# Patient Record
Sex: Female | Born: 1960 | Race: Black or African American | Hispanic: No | Marital: Single | Smoking: Never smoker
Health system: Southern US, Community
[De-identification: ages and names within clinical notes are randomized; demographics above are authoritative.]

## PROBLEM LIST (undated history)

## (undated) DIAGNOSIS — E042 Nontoxic multinodular goiter: Secondary | ICD-10-CM

## (undated) DIAGNOSIS — I509 Heart failure, unspecified: Secondary | ICD-10-CM

## (undated) DIAGNOSIS — F329 Major depressive disorder, single episode, unspecified: Secondary | ICD-10-CM

## (undated) DIAGNOSIS — R7303 Prediabetes: Secondary | ICD-10-CM

## (undated) DIAGNOSIS — M21619 Bunion of unspecified foot: Secondary | ICD-10-CM

## (undated) DIAGNOSIS — N644 Mastodynia: Secondary | ICD-10-CM

## (undated) DIAGNOSIS — E559 Vitamin D deficiency, unspecified: Secondary | ICD-10-CM

## (undated) DIAGNOSIS — K5900 Constipation, unspecified: Secondary | ICD-10-CM

## (undated) DIAGNOSIS — IMO0001 Reserved for inherently not codable concepts without codable children: Secondary | ICD-10-CM

## (undated) DIAGNOSIS — N951 Menopausal and female climacteric states: Secondary | ICD-10-CM

## (undated) DIAGNOSIS — R0602 Shortness of breath: Secondary | ICD-10-CM

## (undated) DIAGNOSIS — M2550 Pain in unspecified joint: Secondary | ICD-10-CM

## (undated) DIAGNOSIS — L210 Seborrhea capitis: Secondary | ICD-10-CM

## (undated) DIAGNOSIS — E669 Obesity, unspecified: Secondary | ICD-10-CM

## (undated) DIAGNOSIS — R5383 Other fatigue: Secondary | ICD-10-CM

## (undated) DIAGNOSIS — B009 Herpesviral infection, unspecified: Secondary | ICD-10-CM

## (undated) DIAGNOSIS — G4730 Sleep apnea, unspecified: Secondary | ICD-10-CM

## (undated) DIAGNOSIS — R071 Chest pain on breathing: Secondary | ICD-10-CM

## (undated) DIAGNOSIS — G9332 Myalgic encephalomyelitis/chronic fatigue syndrome: Secondary | ICD-10-CM

## (undated) DIAGNOSIS — M797 Fibromyalgia: Secondary | ICD-10-CM

## (undated) DIAGNOSIS — M069 Rheumatoid arthritis, unspecified: Secondary | ICD-10-CM

## (undated) DIAGNOSIS — R5381 Other malaise: Secondary | ICD-10-CM

## (undated) DIAGNOSIS — Z5189 Encounter for other specified aftercare: Secondary | ICD-10-CM

## (undated) DIAGNOSIS — F32A Depression, unspecified: Secondary | ICD-10-CM

## (undated) DIAGNOSIS — N189 Chronic kidney disease, unspecified: Secondary | ICD-10-CM

## (undated) DIAGNOSIS — F419 Anxiety disorder, unspecified: Secondary | ICD-10-CM

## (undated) DIAGNOSIS — E7800 Pure hypercholesterolemia, unspecified: Secondary | ICD-10-CM

## (undated) DIAGNOSIS — F411 Generalized anxiety disorder: Secondary | ICD-10-CM

## (undated) DIAGNOSIS — L708 Other acne: Secondary | ICD-10-CM

## (undated) DIAGNOSIS — L709 Acne, unspecified: Secondary | ICD-10-CM

## (undated) DIAGNOSIS — R002 Palpitations: Secondary | ICD-10-CM

## (undated) DIAGNOSIS — M7121 Synovial cyst of popliteal space [Baker], right knee: Secondary | ICD-10-CM

## (undated) DIAGNOSIS — I499 Cardiac arrhythmia, unspecified: Secondary | ICD-10-CM

## (undated) DIAGNOSIS — M542 Cervicalgia: Secondary | ICD-10-CM

## (undated) DIAGNOSIS — M1990 Unspecified osteoarthritis, unspecified site: Secondary | ICD-10-CM

## (undated) HISTORY — DX: Acne, unspecified: L70.9

## (undated) HISTORY — DX: Cardiac arrhythmia, unspecified: I49.9

## (undated) HISTORY — DX: Nontoxic multinodular goiter: E04.2

## (undated) HISTORY — DX: Rheumatoid arthritis, unspecified: M06.9

## (undated) HISTORY — DX: Major depressive disorder, single episode, unspecified: F32.9

## (undated) HISTORY — DX: Pure hypercholesterolemia, unspecified: E78.00

## (undated) HISTORY — DX: Pain in unspecified joint: M25.50

## (undated) HISTORY — DX: Shortness of breath: R06.02

## (undated) HISTORY — DX: Cervicalgia: M54.2

## (undated) HISTORY — DX: Menopausal and female climacteric states: N95.1

## (undated) HISTORY — PX: TONSILLECTOMY: SUR1361

## (undated) HISTORY — DX: Chest pain on breathing: R07.1

## (undated) HISTORY — DX: Vitamin D deficiency, unspecified: E55.9

## (undated) HISTORY — DX: Chronic kidney disease, unspecified: N18.9

## (undated) HISTORY — DX: Unspecified osteoarthritis, unspecified site: M19.90

## (undated) HISTORY — DX: Palpitations: R00.2

## (undated) HISTORY — DX: Other malaise: R53.81

## (undated) HISTORY — DX: Other malaise: R53.83

## (undated) HISTORY — DX: Reserved for inherently not codable concepts without codable children: IMO0001

## (undated) HISTORY — DX: Encounter for other specified aftercare: Z51.89

## (undated) HISTORY — DX: Obesity, unspecified: E66.9

## (undated) HISTORY — DX: Synovial cyst of popliteal space (Baker), right knee: M71.21

## (undated) HISTORY — DX: Sleep apnea, unspecified: G47.30

## (undated) HISTORY — DX: Seborrhea capitis: L21.0

## (undated) HISTORY — DX: Constipation, unspecified: K59.00

## (undated) HISTORY — DX: Mastodynia: N64.4

## (undated) HISTORY — DX: Bunion of unspecified foot: M21.619

## (undated) HISTORY — DX: Herpesviral infection, unspecified: B00.9

## (undated) HISTORY — DX: Heart failure, unspecified: I50.9

## (undated) HISTORY — DX: Other acne: L70.8

## (undated) HISTORY — DX: Myalgic encephalomyelitis/chronic fatigue syndrome: G93.32

## (undated) HISTORY — PX: ABDOMINAL HYSTERECTOMY: SHX81

## (undated) HISTORY — PX: REFRACTIVE SURGERY: SHX103

## (undated) HISTORY — DX: Prediabetes: R73.03

## (undated) HISTORY — DX: Fibromyalgia: M79.7

## (undated) HISTORY — PX: CARDIAC CATHETERIZATION: SHX172

## (undated) HISTORY — DX: Generalized anxiety disorder: F41.1

## (undated) HISTORY — DX: Depression, unspecified: F32.A

## (undated) HISTORY — PX: LAPAROSCOPIC HYSTERECTOMY: SHX1926

## (undated) HISTORY — DX: Anxiety disorder, unspecified: F41.9

---

## 2001-12-04 ENCOUNTER — Other Ambulatory Visit: Admission: RE | Admit: 2001-12-04 | Discharge: 2001-12-04 | Payer: Self-pay | Admitting: Internal Medicine

## 2001-12-07 ENCOUNTER — Encounter: Payer: Self-pay | Admitting: Internal Medicine

## 2001-12-07 ENCOUNTER — Encounter: Admission: RE | Admit: 2001-12-07 | Discharge: 2001-12-07 | Payer: Self-pay | Admitting: Internal Medicine

## 2002-08-31 ENCOUNTER — Emergency Department (HOSPITAL_COMMUNITY): Admission: EM | Admit: 2002-08-31 | Discharge: 2002-08-31 | Payer: Self-pay | Admitting: Emergency Medicine

## 2002-09-02 ENCOUNTER — Encounter: Payer: Self-pay | Admitting: Emergency Medicine

## 2002-09-02 ENCOUNTER — Ambulatory Visit (HOSPITAL_COMMUNITY): Admission: RE | Admit: 2002-09-02 | Discharge: 2002-09-02 | Payer: Self-pay | Admitting: Emergency Medicine

## 2006-01-02 ENCOUNTER — Emergency Department (HOSPITAL_COMMUNITY): Admission: EM | Admit: 2006-01-02 | Discharge: 2006-01-02 | Payer: Self-pay | Admitting: Emergency Medicine

## 2007-07-13 ENCOUNTER — Emergency Department: Payer: Self-pay | Admitting: Internal Medicine

## 2007-07-13 ENCOUNTER — Other Ambulatory Visit: Payer: Self-pay

## 2007-10-30 ENCOUNTER — Ambulatory Visit: Payer: Self-pay

## 2008-06-14 ENCOUNTER — Other Ambulatory Visit: Payer: Self-pay

## 2008-06-15 ENCOUNTER — Inpatient Hospital Stay: Payer: Self-pay | Admitting: Internal Medicine

## 2008-08-12 ENCOUNTER — Ambulatory Visit: Payer: Self-pay | Admitting: Family Medicine

## 2008-08-13 ENCOUNTER — Ambulatory Visit: Payer: Self-pay | Admitting: Family Medicine

## 2008-11-18 ENCOUNTER — Ambulatory Visit: Payer: Self-pay | Admitting: Internal Medicine

## 2008-11-18 ENCOUNTER — Ambulatory Visit: Payer: Self-pay | Admitting: Obstetrics & Gynecology

## 2008-11-25 ENCOUNTER — Inpatient Hospital Stay: Payer: Self-pay | Admitting: Obstetrics & Gynecology

## 2008-12-05 ENCOUNTER — Ambulatory Visit: Payer: Self-pay | Admitting: Obstetrics & Gynecology

## 2008-12-07 ENCOUNTER — Inpatient Hospital Stay: Payer: Self-pay | Admitting: Obstetrics & Gynecology

## 2008-12-19 ENCOUNTER — Ambulatory Visit: Payer: Self-pay | Admitting: Internal Medicine

## 2008-12-31 ENCOUNTER — Ambulatory Visit: Payer: Self-pay | Admitting: Urology

## 2009-03-09 ENCOUNTER — Encounter: Payer: Self-pay | Admitting: Vascular Surgery

## 2009-03-19 ENCOUNTER — Encounter: Payer: Self-pay | Admitting: Vascular Surgery

## 2009-06-25 ENCOUNTER — Other Ambulatory Visit: Payer: Self-pay | Admitting: Family Medicine

## 2009-09-18 LAB — HM DEXA SCAN

## 2009-10-05 ENCOUNTER — Ambulatory Visit: Payer: Self-pay | Admitting: Family Medicine

## 2009-11-24 ENCOUNTER — Ambulatory Visit: Payer: Self-pay | Admitting: Family Medicine

## 2009-12-06 ENCOUNTER — Ambulatory Visit: Payer: Self-pay | Admitting: Family Medicine

## 2009-12-31 IMAGING — CT CT HEAD WITHOUT CONTRAST
2 series · 16 of 30 positions shown, 20 images · non-contrast
Comparison: none

REASON FOR EXAM: weak l sided numbness
COMMENTS:

PROCEDURE:     CT  - CT HEAD WITHOUT CONTRAST  - June 14, 2008 [DATE]
RESULT:     No intraaxial or extraaxial pathologic fluid or blood
collections are identified.  No mass lesion is noted. There is no
hydrocephalus.  No bony abnormality is identified.

[Series 2: without · axial · non-contrast · 0.44mm/px · z∈[-174,-39]mm · 13 of 33 slices shown, 17 images]
[im 3/33  brain]
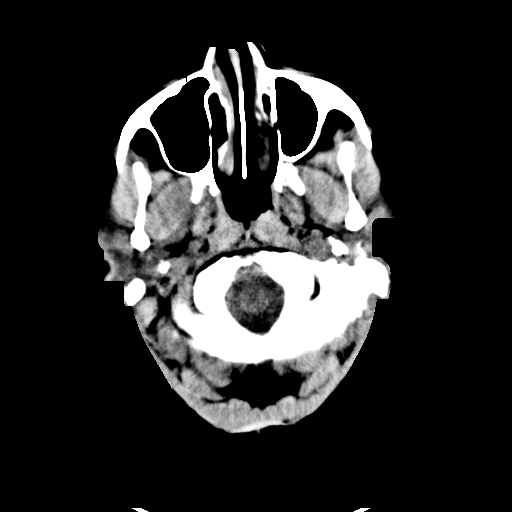
[im 3/33  bone]
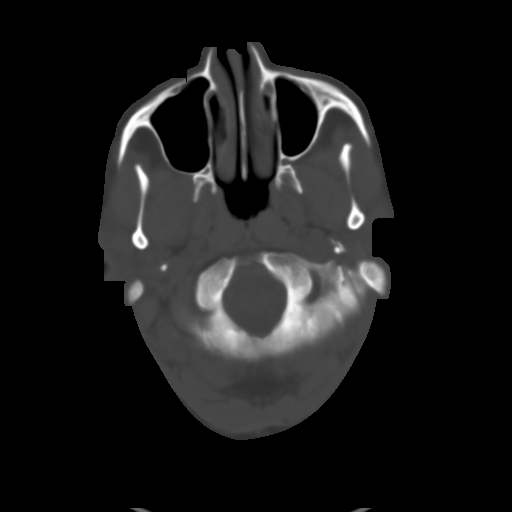
[im 5/33  brain]
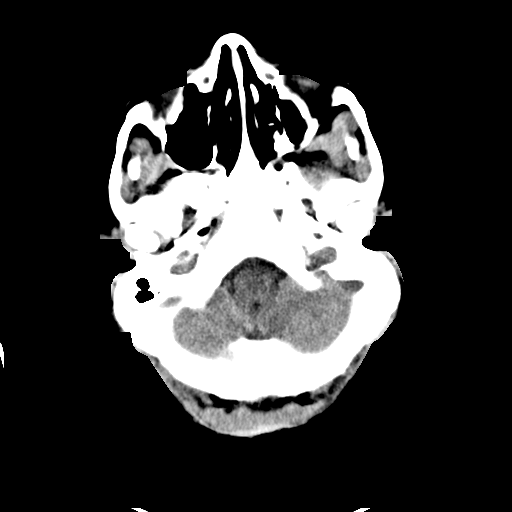
[im 7/33  brain]
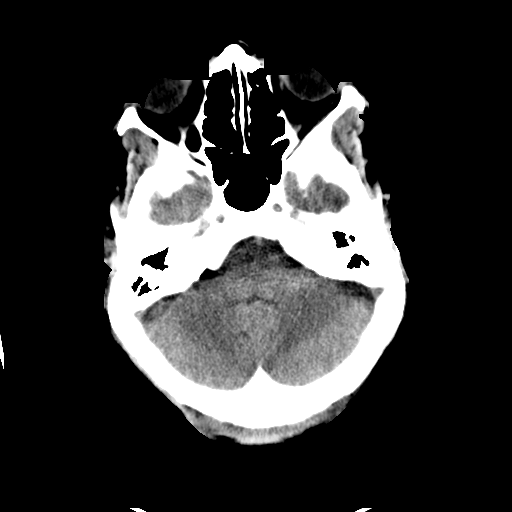
[im 10/33  brain]
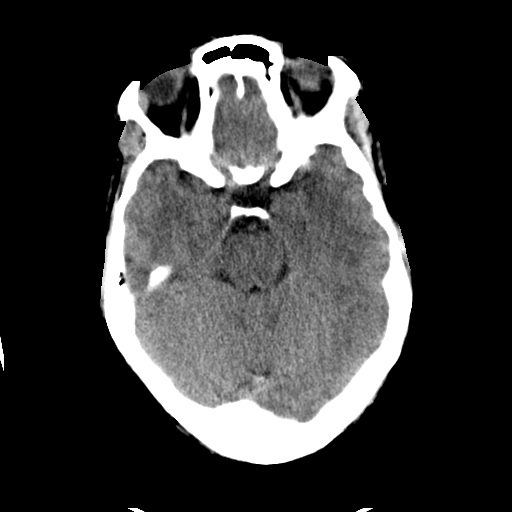
[im 12/33  brain]
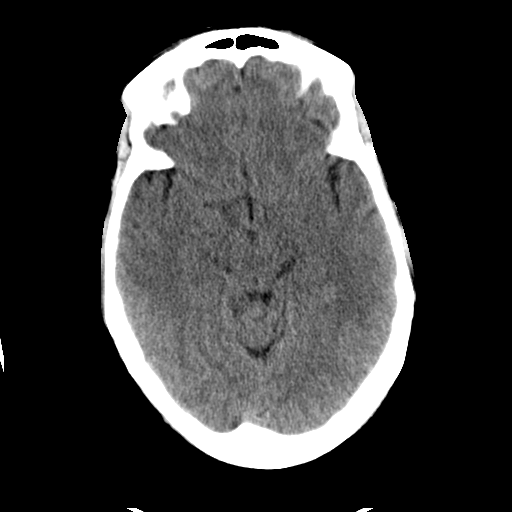
[im 12/33  bone]
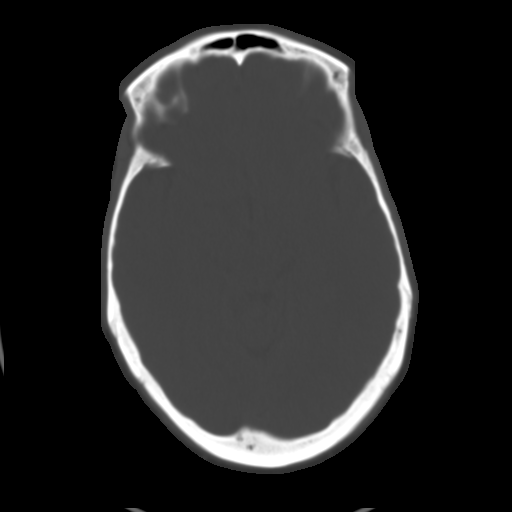
[im 14/33  brain]
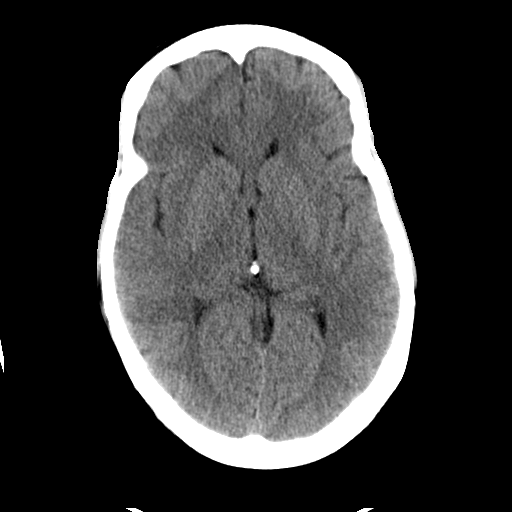
[im 17/33  brain]
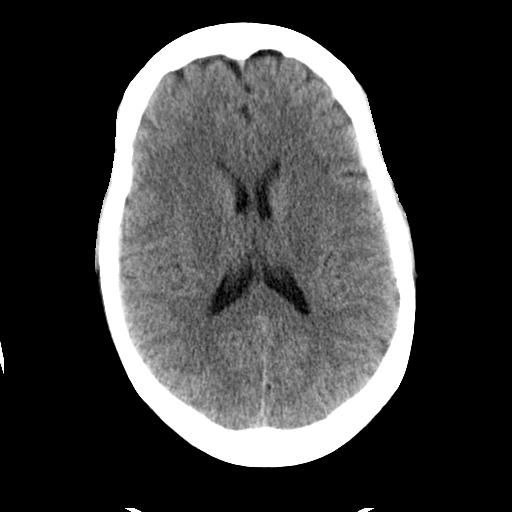
[im 19/33  brain]
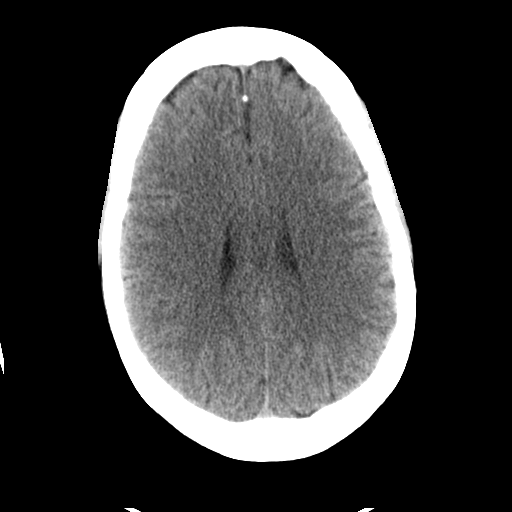
[im 21/33  brain]
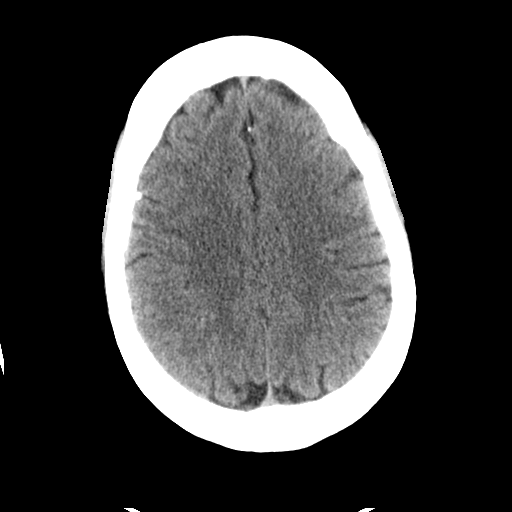
[im 21/33  bone]
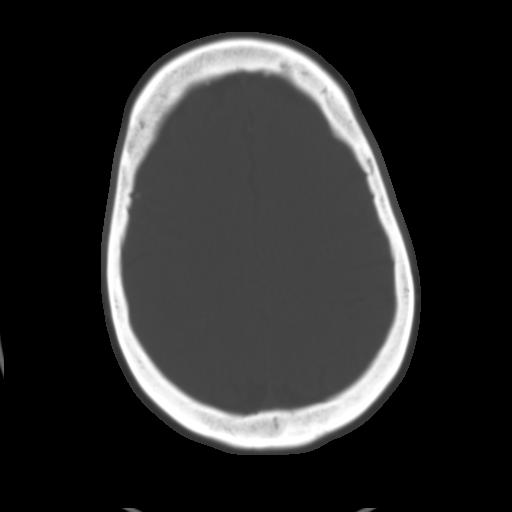
[im 23/33  brain]
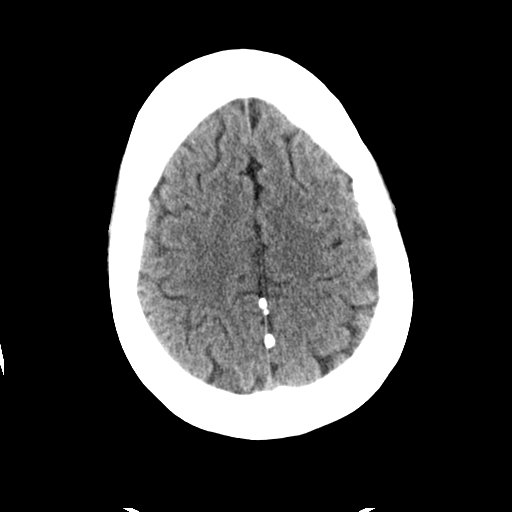
[im 26/33  brain]
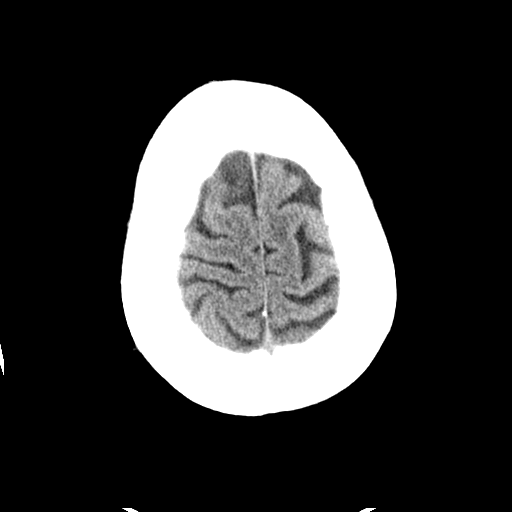
[im 28/33  brain]
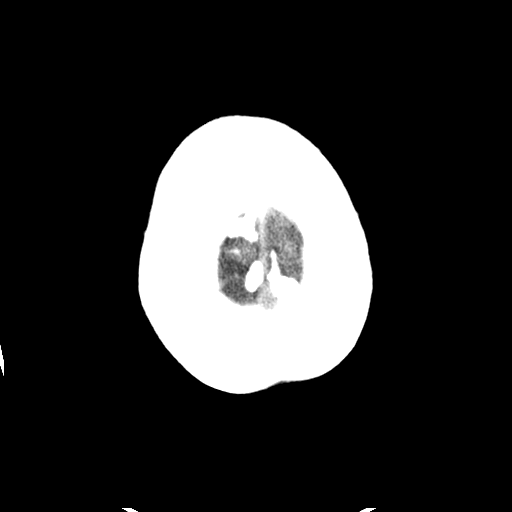
[im 30/33  brain]
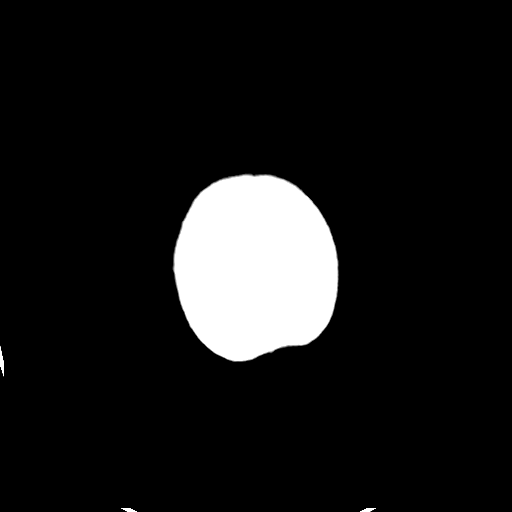
[im 30/33  bone]
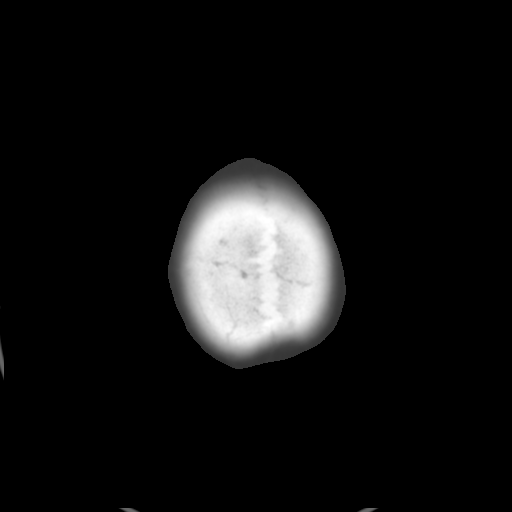

[Series 3: bone · axial · 0.44mm/px · z∈[-174,-129]mm · 3 of 33 slices shown]
[im 3/33  bone]
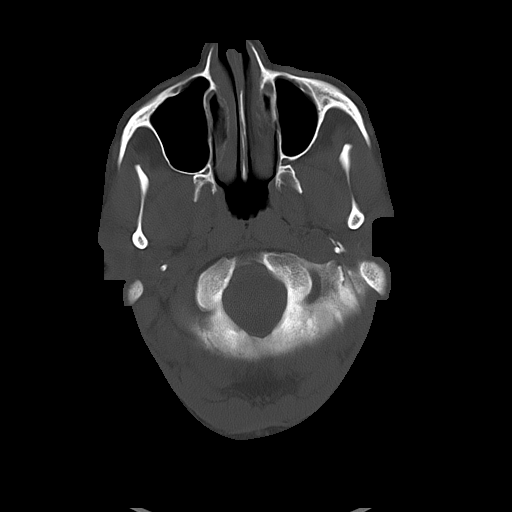
[im 7/33  bone]
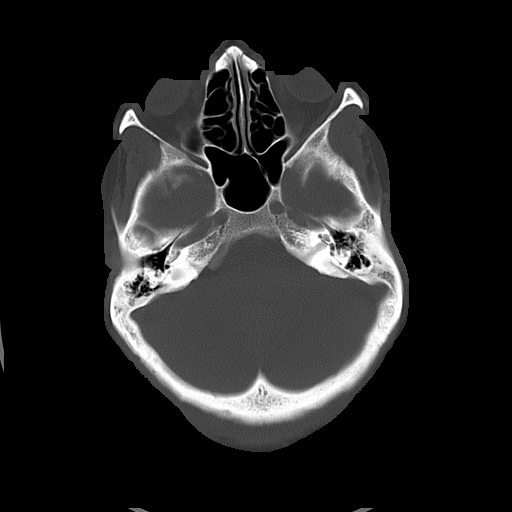
[im 12/33  bone]
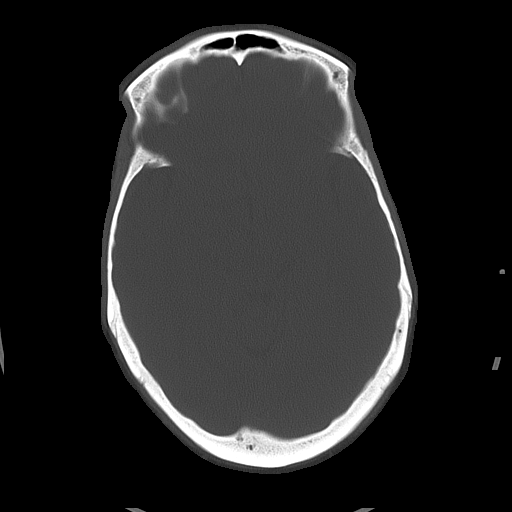

[16 of 30 positions shown; findings below may reference images not displayed]

IMPRESSION: No acute or focal abnormalities.

## 2010-12-01 ENCOUNTER — Ambulatory Visit: Payer: Self-pay | Admitting: Family Medicine

## 2010-12-08 ENCOUNTER — Ambulatory Visit: Payer: Self-pay | Admitting: Family Medicine

## 2010-12-19 HISTORY — PX: CARPAL TUNNEL RELEASE: SHX101

## 2010-12-19 HISTORY — PX: LIPOMA EXCISION: SHX5283

## 2010-12-31 ENCOUNTER — Ambulatory Visit: Payer: Self-pay | Admitting: Specialist

## 2011-01-07 ENCOUNTER — Ambulatory Visit: Payer: Self-pay | Admitting: Specialist

## 2011-01-09 ENCOUNTER — Encounter: Payer: Self-pay | Admitting: Obstetrics and Gynecology

## 2011-12-16 ENCOUNTER — Ambulatory Visit: Payer: Self-pay | Admitting: Family Medicine

## 2012-02-01 ENCOUNTER — Ambulatory Visit: Payer: Self-pay | Admitting: Family Medicine

## 2012-03-05 ENCOUNTER — Ambulatory Visit: Payer: Self-pay | Admitting: Family Medicine

## 2012-03-27 ENCOUNTER — Ambulatory Visit: Payer: Self-pay | Admitting: General Surgery

## 2012-04-17 ENCOUNTER — Ambulatory Visit: Payer: Self-pay | Admitting: General Surgery

## 2012-04-17 LAB — HM COLONOSCOPY: HM Colonoscopy: NORMAL

## 2013-01-15 ENCOUNTER — Encounter: Payer: Self-pay | Admitting: *Deleted

## 2013-01-21 ENCOUNTER — Ambulatory Visit: Payer: Self-pay | Admitting: Cardiovascular Disease

## 2013-01-23 ENCOUNTER — Ambulatory Visit: Payer: Self-pay | Admitting: Family Medicine

## 2013-01-23 LAB — HM MAMMOGRAPHY: HM Mammogram: NORMAL

## 2013-01-25 ENCOUNTER — Encounter: Payer: Self-pay | Admitting: Cardiovascular Disease

## 2013-01-25 ENCOUNTER — Ambulatory Visit (INDEPENDENT_AMBULATORY_CARE_PROVIDER_SITE_OTHER): Payer: BC Managed Care – PPO | Admitting: Cardiovascular Disease

## 2013-01-25 VITALS — BP 122/78 | HR 79 | Ht 65.5 in | Wt 183.2 lb

## 2013-01-25 DIAGNOSIS — R002 Palpitations: Secondary | ICD-10-CM

## 2013-01-25 DIAGNOSIS — R079 Chest pain, unspecified: Secondary | ICD-10-CM

## 2013-01-25 NOTE — Patient Instructions (Addendum)
Your physician has requested that you have an exercise tolerance test. For further information please visit [email or web address]. Please also follow instruction sheet, as given.  Follow up as needed.  

## 2013-01-25 NOTE — Assessment & Plan Note (Signed)
Her chest pain is overall atypical and could be musculoskeletal in etiology. Her physical exam is unremarkable and baseline ECG is normal. Her risk factors include postmenopausal state and family history of coronary artery disease (although there is no premature CAD).  I recommend further evaluation with a treadmill stress test. Followup as needed.

## 2013-01-25 NOTE — Progress Notes (Signed)
HPI  This is a pleasant 52 year old [place] American female who was referred by Dr. Carlynn Purl for evaluation of chest pain. The patient has no previous cardiac history. She has no history of diabetes, hypertension or hyperlipidemia. Her father died at the age of 32 from myocardial infarction but he was a smoker. There is no family history of premature coronary artery disease. She is a lifelong nonsmoker. She was told in the past about possible mitral valve prolapse but that was determined not to be the case. She reports intermittent episodes of substernal chest discomfort described as an aching sensation without radiation. It is mostly at rest and is not triggered by physical activities. It's not associated with significant dyspnea. There is no orthopnea, PND or lower extremity edema. It's occasionally triggered by stress and anxiety. She reports having a stress test in the past but none in the last few years.  No Known Allergies   Current Outpatient Prescriptions on File Prior to Visit  Medication Sig Dispense Refill  . Armodafinil (NUVIGIL) 250 MG tablet Take 1/2 tablet every other day.      . fluocinonide (LIDEX) 0.05 % external solution Apply 1 application topically 2 (two) times daily.       Marland Kitchen LORazepam (ATIVAN) 0.5 MG tablet Take 0.5 mg by mouth every 8 (eight) hours as needed.       . traZODone (DESYREL) 50 MG tablet Take 50 mg by mouth at bedtime.          Past Medical History  Diagnosis Date  . Herpes simplex without mention of complication   . Unspecified constipation   . Cervicalgia   . Other acne   . Painful respiration   . Seborrhea capitis   . Unspecified sleep apnea   . Unspecified vitamin D deficiency   . Myalgia and myositis, unspecified   . Bunion   . Symptomatic menopausal or female climacteric states   . Other malaise and fatigue   . Mastodynia   . Arrhythmia   . Palpitations      Past Surgical History  Procedure Date  . Laparoscopic hysterectomy       Family History  Problem Relation Age of Onset  . Heart attack Father      History   Social History  . Marital Status: Married    Spouse Name: N/A    Number of Children: N/A  . Years of Education: N/A   Occupational History  . Not on file.   Social History Main Topics  . Smoking status: Never Smoker   . Smokeless tobacco: Not on file  . Alcohol Use: Yes  . Drug Use: No  . Sexually Active:    Other Topics Concern  . Not on file   Social History Narrative  . No narrative on file     ROS Constitutional: Negative for fever, chills, diaphoresis, activity change, appetite change and fatigue.  HENT: Negative for hearing loss, nosebleeds, congestion, sore throat, facial swelling, drooling, trouble swallowing, neck pain, voice change, sinus pressure and tinnitus.  Eyes: Negative for photophobia, pain, discharge and visual disturbance.  Respiratory: Negative for apnea, cough shortness of breath and wheezing.  Cardiovascular: Negative for chest pain, palpitations and leg swelling.  Gastrointestinal: Negative for nausea, vomiting, abdominal pain, diarrhea, constipation, blood in stool and abdominal distention.  Genitourinary: Negative for dysuria, urgency, frequency, hematuria and decreased urine volume.  Musculoskeletal: Negative for myalgias, back pain, joint swelling, arthralgias and gait problem.  Skin: Negative for color change, pallor,  rash and wound.  Neurological: Negative for dizziness, tremors, seizures, syncope, speech difficulty, weakness, light-headedness, numbness and headaches.  Psychiatric/Behavioral: Negative for suicidal ideas, hallucinations, behavioral problems and agitation. The patient is not nervous/anxious.     PHYSICAL EXAM   BP 122/78  Pulse 79  Ht 5' 5.5" (1.664 m)  Wt 183 lb 4 oz (83.122 kg)  BMI 30.03 kg/m2 Constitutional: She is oriented to person, place, and time. She appears well-developed and well-nourished. No distress.  HENT: No  nasal discharge.  Head: Normocephalic and atraumatic.  Eyes: Pupils are equal and round. Right eye exhibits no discharge. Left eye exhibits no discharge.  Neck: Normal range of motion. Neck supple. No JVD present. No thyromegaly present.  Cardiovascular: Normal rate, regular rhythm, normal heart sounds. Exam reveals no gallop and no friction rub. No murmur heard.  Pulmonary/Chest: Effort normal and breath sounds normal. No stridor. No respiratory distress. She has no wheezes. She has no rales. She exhibits no tenderness.  Abdominal: Soft. Bowel sounds are normal. She exhibits no distension. There is no tenderness. There is no rebound and no guarding.  Musculoskeletal: Normal range of motion. She exhibits no edema and no tenderness.  Neurological: She is alert and oriented to person, place, and time. Coordination normal.  Skin: Skin is warm and dry. No rash noted. She is not diaphoretic. No erythema. No pallor.  Psychiatric: She has a normal mood and affect. Her behavior is normal. Judgment and thought content normal.     EKG: Sinus  Rhythm  WITHIN NORMAL LIMITS   ASSESSMENT AND PLAN

## 2013-01-29 ENCOUNTER — Ambulatory Visit: Payer: Self-pay | Admitting: Family Medicine

## 2013-02-08 ENCOUNTER — Ambulatory Visit (INDEPENDENT_AMBULATORY_CARE_PROVIDER_SITE_OTHER): Payer: BC Managed Care – PPO | Admitting: Cardiovascular Disease

## 2013-02-08 ENCOUNTER — Encounter: Payer: Self-pay | Admitting: Cardiovascular Disease

## 2013-02-08 DIAGNOSIS — R079 Chest pain, unspecified: Secondary | ICD-10-CM

## 2013-02-08 NOTE — Patient Instructions (Addendum)
Your stress test is normal.  Follow up as needed.  

## 2013-02-08 NOTE — Procedures (Signed)
    Treadmill Stress test  Indication: Atypical chest pain.  Baseline Data:  Resting EKG shows NSR with rate of 74 bpm, no significant ST or T wave changes. Resting blood pressure of 110/80 mm Hg Stand bruce protocal was used.  Exercise Data:  Patient exercised for  8 min 0 sec,  Peak heart rate of 153 bpm.  This was 91 % of the maximum predicted heart rate. No symptoms of chest pain or lightheadedness were reported at peak stress or in recovery.  Peak Blood pressure recorded was 152/82 Maximal work level: 10.1 METs.  Heart rate at 3 minutes in recovery was 100 bpm. BP response: Normal HR response: Normal  EKG with Exercise: Sinus tachycardia with no significant ST changes.  FINAL IMPRESSION: Normal exercise stress test. No significant EKG changes concerning for ischemia. Good exercise tolerance.

## 2013-06-04 ENCOUNTER — Ambulatory Visit (INDEPENDENT_AMBULATORY_CARE_PROVIDER_SITE_OTHER): Payer: BC Managed Care – PPO | Admitting: General Surgery

## 2013-07-02 ENCOUNTER — Ambulatory Visit (INDEPENDENT_AMBULATORY_CARE_PROVIDER_SITE_OTHER): Payer: BC Managed Care – PPO | Admitting: General Surgery

## 2013-07-02 ENCOUNTER — Encounter (INDEPENDENT_AMBULATORY_CARE_PROVIDER_SITE_OTHER): Payer: Self-pay | Admitting: General Surgery

## 2013-07-02 VITALS — BP 118/72 | HR 88 | Resp 14 | Ht 65.5 in | Wt 183.6 lb

## 2013-07-02 DIAGNOSIS — N644 Mastodynia: Secondary | ICD-10-CM

## 2013-07-04 NOTE — Progress Notes (Signed)
Patient ID: Teresa Gallagher, female   DOB: 06-14-1961, 52 y.o.   MRN: [id number]  Chief Complaint  Patient presents with  . New Evaluation    eval enlarged axill lymphnodes    HPI Teresa Gallagher is a 52 y.o. female.  Referred by Dr Maurice March HPI 5 yof who about 2 years ago developed pain and tenderness in the outer edge of right breast/right chest wall.  Apparently mm/us has detected some enlarged nodes on that right side.  The latest [place] I have shows that bilateral axilla demonstrated enlarged nodes up to 1.6 cm that are normal appearing.  She has more studies that are not available during this visit.  This tenderness has gotten worse over the last year.  Apparently a biopsy of the nodes has been recommended at some time.  The pain wakes her up at night.  She is not exercising due to pain.  She has no discharge and has no mass present.  She has taken some ibuprofen which has helped.   Past Medical History  Diagnosis Date  . Herpes simplex without mention of complication   . Unspecified constipation   . Cervicalgia   . Other acne   . Painful respiration   . Seborrhea capitis   . Unspecified sleep apnea   . Unspecified vitamin D deficiency   . Myalgia and myositis, unspecified   . Bunion   . Symptomatic menopausal or female climacteric states   . Other malaise and fatigue   . Mastodynia   . Arrhythmia   . Palpitations   . Blood transfusion without reported diagnosis     Past Surgical History  Procedure Laterality Date  . Laparoscopic hysterectomy      followed by 3 cuff repairs  . Cesarean section      Family History  Problem Relation Age of Onset  . Heart attack Father     Social History History  Substance Use Topics  . Smoking status: Never Smoker   . Smokeless tobacco: Not on file  . Alcohol Use: Not on file    No Known Allergies  Current Outpatient Prescriptions  Medication Sig Dispense Refill  . Armodafinil (NUVIGIL) 250 MG tablet Take 1/2 tablet every other  day.      . Ascorbic Acid (VITAMIN C) 1000 MG tablet Take 1,000 mg by mouth daily.      Marland Kitchen aspirin 81 MG tablet Take 81 mg by mouth daily.      . valACYclovir (VALTREX) 500 MG tablet Take 500 mg by mouth daily as needed.       . Vitamin D, Ergocalciferol, (DRISDOL) 50000 UNITS CAPS Take 50,000 Units by mouth every 7 (seven) days.       . fluocinonide (LIDEX) 0.05 % external solution Apply 1 application topically 2 (two) times daily.       Marland Kitchen LORazepam (ATIVAN) 0.5 MG tablet Take 0.5 mg by mouth every 8 (eight) hours as needed.       . traZODone (DESYREL) 50 MG tablet Take 50 mg by mouth at bedtime.        No current facility-administered medications for this visit.    Review of Systems Review of Systems  Constitutional: Negative for fever, chills and unexpected weight change.  HENT: Negative for hearing loss, congestion, sore throat, trouble swallowing and voice change.   Eyes: Negative for visual disturbance.  Respiratory: Negative for cough and wheezing.   Cardiovascular: Positive for palpitations. Negative for chest pain and leg swelling.  Gastrointestinal: Negative  for nausea, vomiting, abdominal pain, diarrhea, constipation, blood in stool, abdominal distention and anal bleeding.  Genitourinary: Negative for hematuria, vaginal bleeding and difficulty urinating.  Musculoskeletal: Negative for arthralgias.  Skin: Negative for rash and wound.  Neurological: Negative for seizures, syncope and headaches.  Hematological: Negative for adenopathy. Does not bruise/bleed easily.  Psychiatric/Behavioral: Negative for confusion.    Blood pressure 118/72, pulse 88, resp. rate 14, height 5' 5.5" (1.664 m), weight 183 lb 9.6 oz (83.28 kg).  Physical Exam Physical Exam  Constitutional: She appears well-developed and well-nourished.  Cardiovascular: Normal rate, regular rhythm and normal heart sounds.   Pulmonary/Chest: Effort normal and breath sounds normal. She has no wheezes. She has no rales.     Abdominal: Soft.  Lymphadenopathy:    She has no cervical adenopathy.    She has axillary adenopathy (mildly enlarged axillary nodes bilaterally).       Right: No supraclavicular adenopathy present.       Left: No supraclavicular adenopathy present.    Data Reviewed Report of mm/us from [facility]  Assessment    Right breast or chest wall pain Bilateral axillary lad     Plan    I don't know that there is any abnormality in the breast by exam or by imaging.  I don't have all her imaging available as she states she had an mr.  I will have to get her mr and she said she will bring that.  I will have to wait to see that before coming up with a final plan.  Will have to review nodes also to see if a biopsy is indicated.  I will plan on seeing her back after all information is available.  She has gotten better with antiinflammatories and will try that for a week also.  This may be chest wall pain.        Indiana Pechacek 07/04/2013, 6:19 PM

## 2013-07-19 ENCOUNTER — Telehealth (INDEPENDENT_AMBULATORY_CARE_PROVIDER_SITE_OTHER): Payer: Self-pay

## 2013-07-19 NOTE — Telephone Encounter (Signed)
error 

## 2013-07-30 ENCOUNTER — Telehealth (INDEPENDENT_AMBULATORY_CARE_PROVIDER_SITE_OTHER): Payer: Self-pay

## 2013-07-30 NOTE — Telephone Encounter (Signed)
Pt returned my call. I advised pt that Dr Dwain Sarna did review the CT scans and he does not see anything of concern with the breast or enlarged lymph nodes. Dr Dwain Sarna does not offer any surgery at this time b/c can't find anything of concern. I offered for the pt to come back in to the office for an appt if any questions with Dr Dwain Sarna and she does want to see him again. I made her an appt with Dr Dwain Sarna for 9/3.

## 2013-07-30 NOTE — Telephone Encounter (Signed)
LMOM for pt to call me so I can discuss Dr Dwain Sarna viewing her CD.

## 2013-08-21 ENCOUNTER — Encounter (INDEPENDENT_AMBULATORY_CARE_PROVIDER_SITE_OTHER): Payer: BC Managed Care – PPO | Admitting: General Surgery

## 2013-09-16 ENCOUNTER — Encounter (INDEPENDENT_AMBULATORY_CARE_PROVIDER_SITE_OTHER): Payer: BC Managed Care – PPO | Admitting: General Surgery

## 2013-09-17 ENCOUNTER — Encounter (INDEPENDENT_AMBULATORY_CARE_PROVIDER_SITE_OTHER): Payer: Self-pay

## 2013-09-21 IMAGING — US US SOFT TISSUE EXCLUDE HEAD/NECK
1 series · 17 of 25 positions shown · non-contrast
Comparison: none

REASON FOR EXAM: attn RT Chest Wall  anterior axillary line  chest wall
pain  soft tissues sw...
COMMENTS:

[Series 1: us soft tissue exclude head/neck · 17 of 25 slices shown]
[im 1/25]
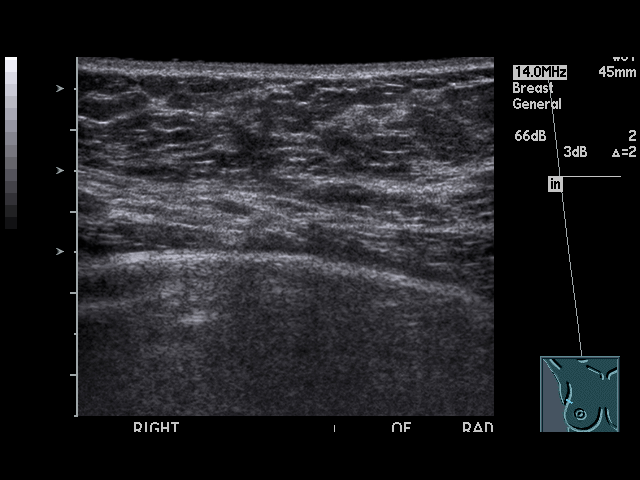
[im 3/25]
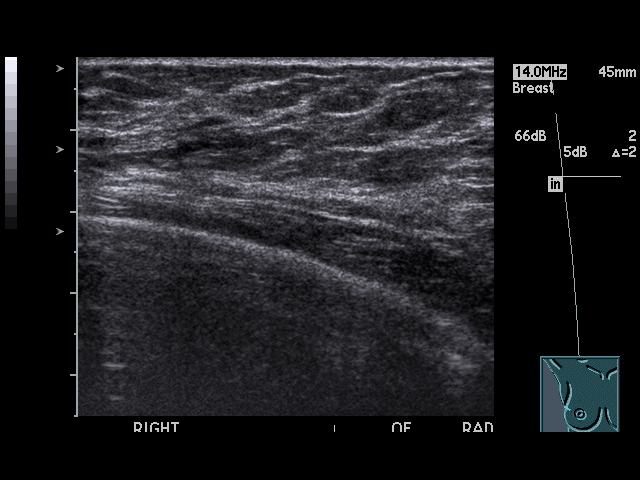
[im 4/25]
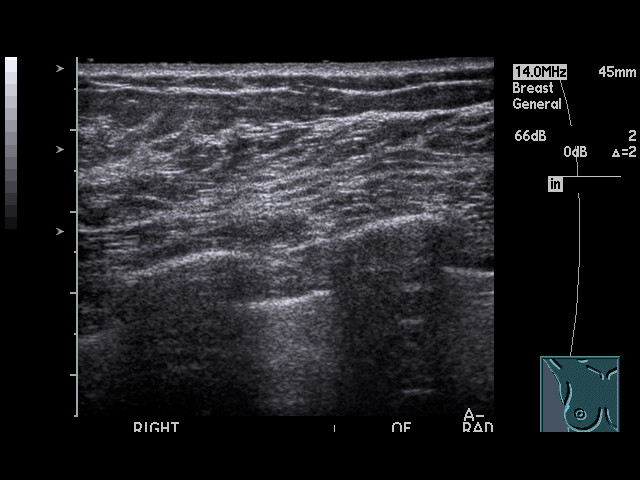
[im 6/25]
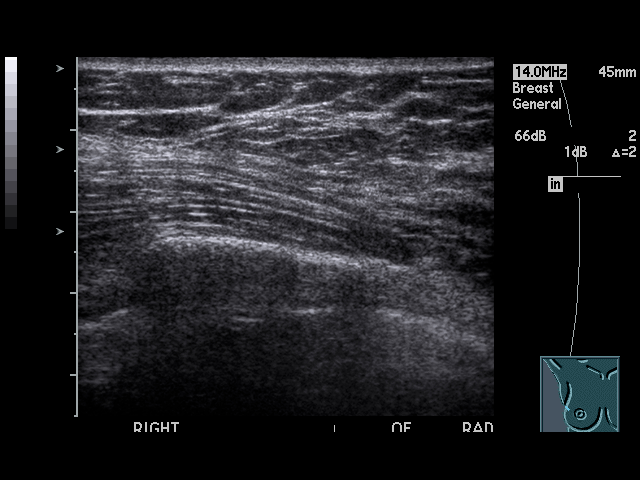
[im 7/25]
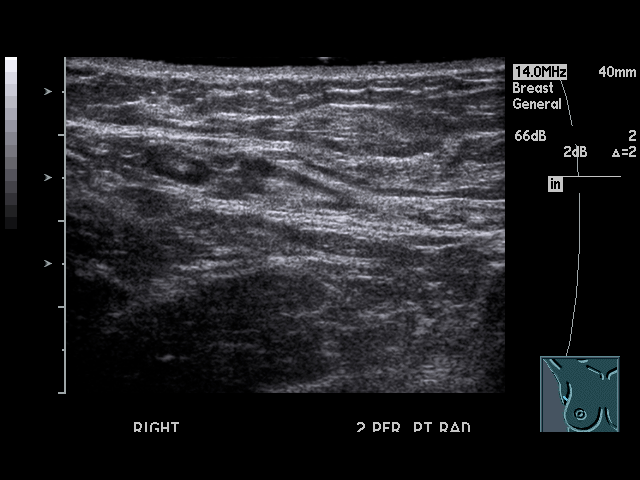
[im 9/25]
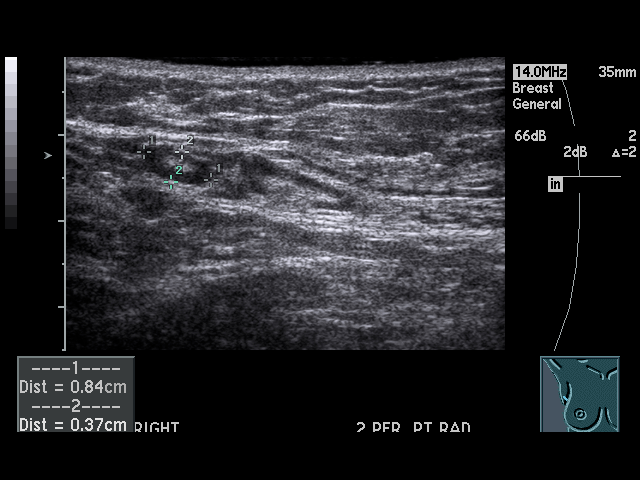
[im 10/25]
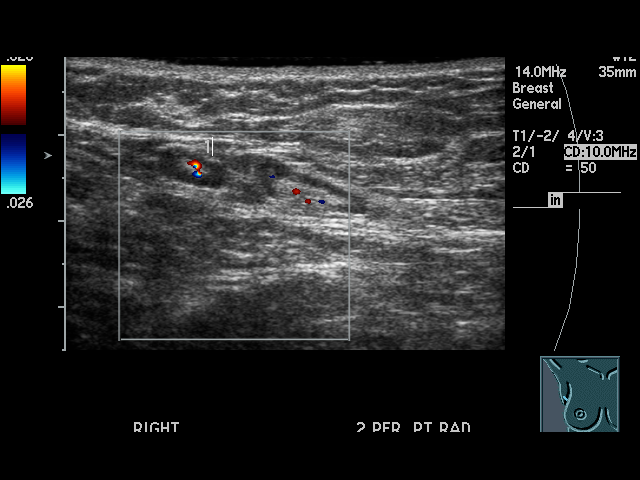
[im 12/25]
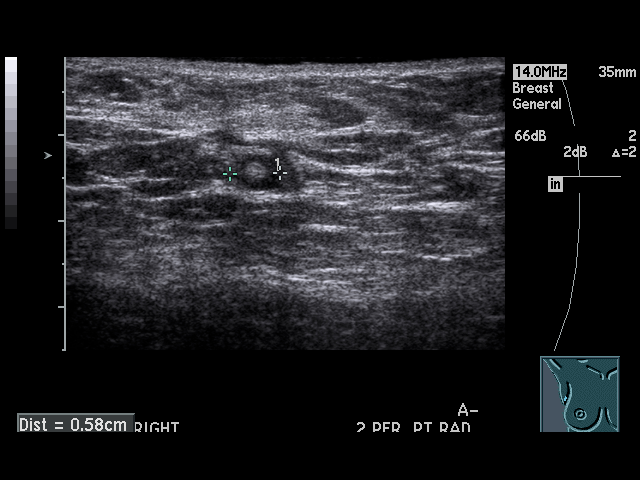
[im 13/25]
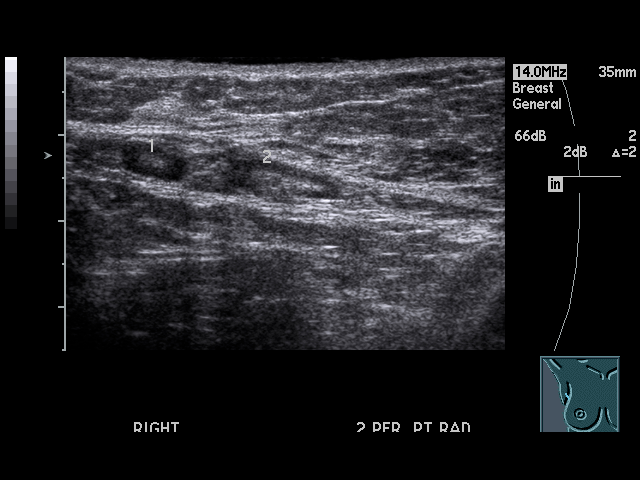
[im 14/25]
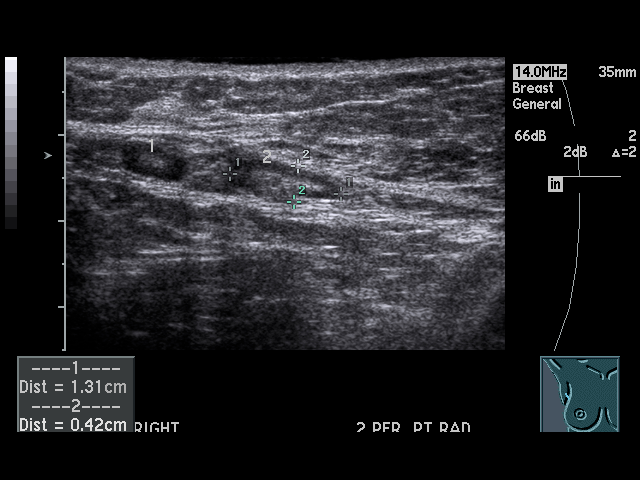
[im 16/25]
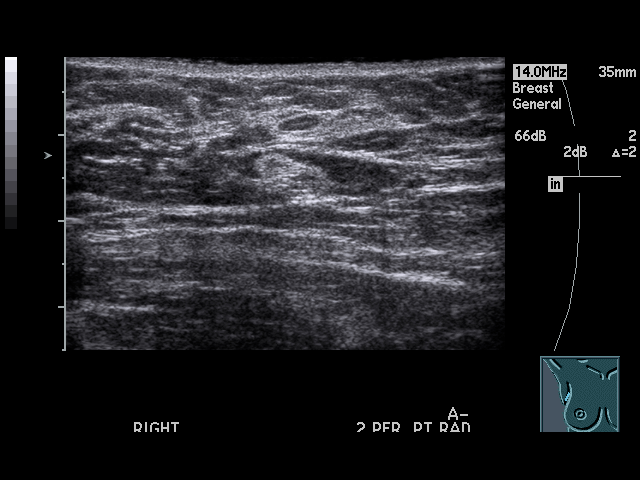
[im 17/25]
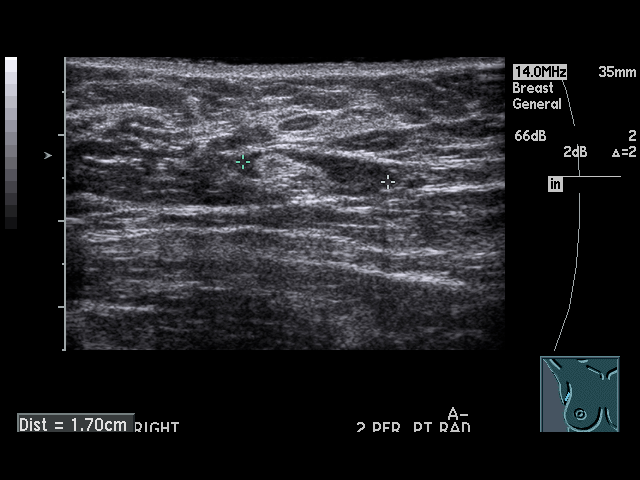
[im 19/25]
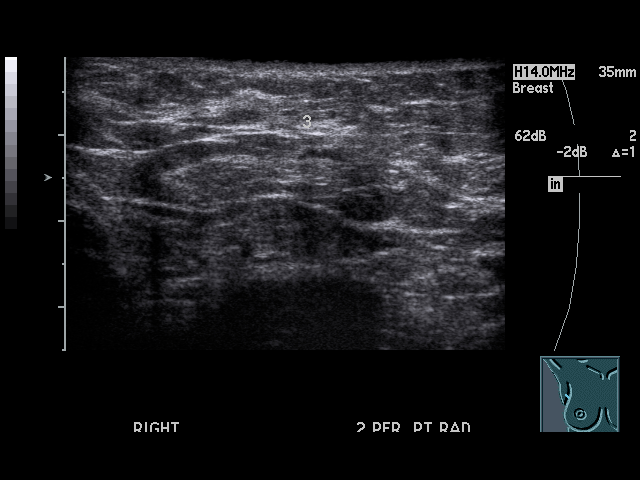
[im 20/25]
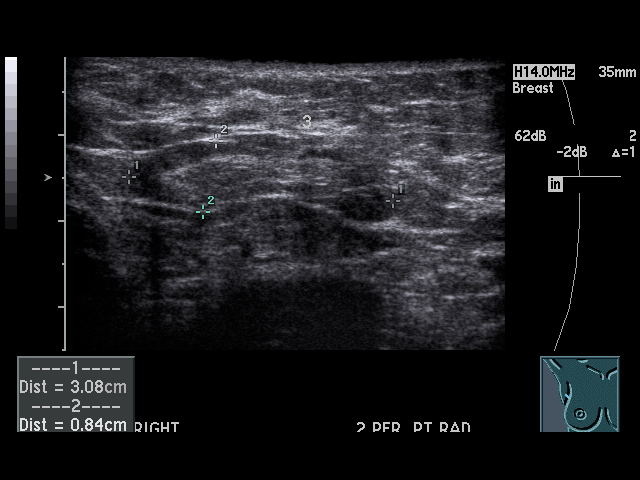
[im 22/25]
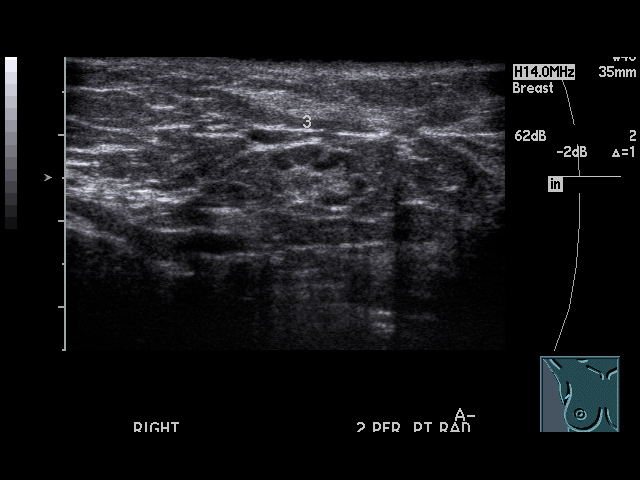
[im 23/25]
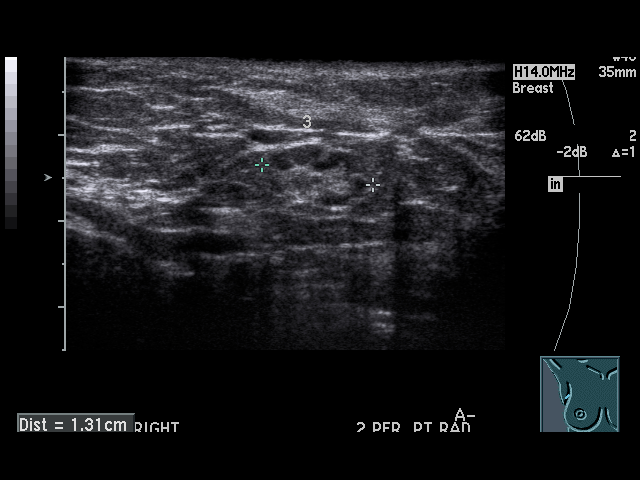
[im 25/25]
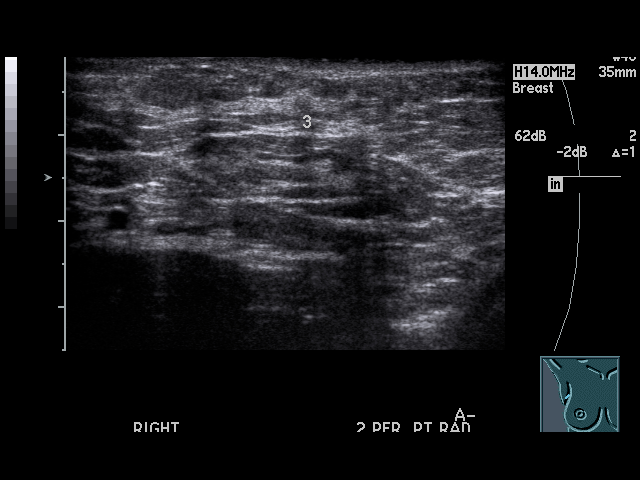

[17 of 25 positions shown; findings below may reference images not displayed]

PROCEDURE:     US  - US SOFT TISSUE, NOT NECK /  HEAD  - March 05, 2012  [DATE]

RESULT:      Two areas of the right axilla were evaluated in the region of
clinical concern. The initial area along the lower axilla, lateral portion
of the right breast in an area of skin thickening demonstrates no solid or
cystic sonographic abnormalities. In a more superior lateral region, three
lymph nodes are appreciated. The largest measures 3.08 x 1.31 x 1.84 cm. The
smaller lymph nodes measure 0.84 x 0.37 cm and 1.31 x 0.42 cm. These
findings have sonographic appearances consistent with lymph nodes
demonstrating a fatty hilum with a hypoechoic peripheral rim.

No further solid or cystic sonographic abnormality is appreciated.
IMPRESSION: Prominent lymph nodes are identified within the right axilla as described
above in the second region of clinical concern. The initial area of clinical
concern in area of skin thickening demonstrates no sonographic abnormalities.

## 2013-10-07 ENCOUNTER — Encounter (INDEPENDENT_AMBULATORY_CARE_PROVIDER_SITE_OTHER): Payer: Self-pay | Admitting: General Surgery

## 2013-10-07 ENCOUNTER — Ambulatory Visit (INDEPENDENT_AMBULATORY_CARE_PROVIDER_SITE_OTHER): Payer: BC Managed Care – PPO | Admitting: General Surgery

## 2013-10-07 VITALS — BP 112/80 | HR 80 | Temp 97.8°F | Resp 14 | Ht 65.0 in | Wt 183.4 lb

## 2013-10-07 DIAGNOSIS — N644 Mastodynia: Secondary | ICD-10-CM

## 2013-10-12 NOTE — Progress Notes (Signed)
Subjective:     Patient ID: Teresa Gallagher, female   DOB: 1961/07/02, 52 y.o.   MRN: [id number]  HPI 83 yof who about 2 years ago developed pain and tenderness in the outer edge of right breast/right chest wall. Apparently mm/us has detected some enlarged nodes on that right side. The latest [place] I have shows that bilateral axilla demonstrated enlarged nodes up to 1.6 cm that are normal appearing. This tenderness has gotten worse over the last year. Apparently a biopsy of the nodes has been recommended at some time. The pain wakes her up at night. She is not exercising due to pain. She has no discharge and has no mass present. She has taken some ibuprofen which has helped. Since our last visit she has sent all her imaging that has been done outside. She is unchanged today.   She also got her ct scan which I have reviewed and have the report.  This was done 2/14.  It shows no mediastinal, hilar or axillary adenopathy.     Review of Systems     Objective:   Physical Exam This is unchanged from our last visit.  Abdominal: Soft.  Lymphadenopathy:  She has no cervical adenopathy.  I do not feel any axillary adenopathy today Right: No supraclavicular adenopathy present.  Left: No supraclavicular adenopathy present.      Assessment:     Breast pain ? History lad      Plan:     I cant identify on exam or imaging any area to biopsy.  I cannot explain what appears to be mastalgia on that right side and we discussed some conservative therapy for that.  I think she should get schedule mm and I can see back as needed

## 2013-12-04 ENCOUNTER — Encounter (INDEPENDENT_AMBULATORY_CARE_PROVIDER_SITE_OTHER): Payer: Self-pay | Admitting: General Surgery

## 2014-01-15 ENCOUNTER — Other Ambulatory Visit: Payer: Self-pay | Admitting: Obstetrics and Gynecology

## 2014-01-15 DIAGNOSIS — M79629 Pain in unspecified upper arm: Secondary | ICD-10-CM

## 2014-01-15 DIAGNOSIS — N644 Mastodynia: Secondary | ICD-10-CM

## 2014-01-23 ENCOUNTER — Other Ambulatory Visit: Payer: BC Managed Care – PPO

## 2014-07-09 ENCOUNTER — Encounter (HOSPITAL_COMMUNITY): Payer: Self-pay | Admitting: Emergency Medicine

## 2014-07-09 ENCOUNTER — Emergency Department (HOSPITAL_COMMUNITY): Payer: BC Managed Care – PPO

## 2014-07-09 ENCOUNTER — Emergency Department (HOSPITAL_COMMUNITY)
Admission: EM | Admit: 2014-07-09 | Discharge: 2014-07-09 | Disposition: A | Discharge status: Home / Self Care | Payer: BC Managed Care – PPO | Attending: Emergency Medicine | Admitting: Emergency Medicine

## 2014-07-09 DIAGNOSIS — Z79899 Other long term (current) drug therapy: Secondary | ICD-10-CM | POA: Insufficient documentation

## 2014-07-09 DIAGNOSIS — Z8639 Personal history of other endocrine, nutritional and metabolic disease: Secondary | ICD-10-CM | POA: Insufficient documentation

## 2014-07-09 DIAGNOSIS — Z791 Long term (current) use of non-steroidal anti-inflammatories (NSAID): Secondary | ICD-10-CM | POA: Insufficient documentation

## 2014-07-09 DIAGNOSIS — R0789 Other chest pain: Secondary | ICD-10-CM

## 2014-07-09 DIAGNOSIS — Z8742 Personal history of other diseases of the female genital tract: Secondary | ICD-10-CM | POA: Insufficient documentation

## 2014-07-09 DIAGNOSIS — R05 Cough: Secondary | ICD-10-CM | POA: Insufficient documentation

## 2014-07-09 DIAGNOSIS — R059 Cough, unspecified: Secondary | ICD-10-CM | POA: Insufficient documentation

## 2014-07-09 DIAGNOSIS — R071 Chest pain on breathing: Secondary | ICD-10-CM | POA: Insufficient documentation

## 2014-07-09 DIAGNOSIS — Z8619 Personal history of other infectious and parasitic diseases: Secondary | ICD-10-CM | POA: Insufficient documentation

## 2014-07-09 DIAGNOSIS — Z8679 Personal history of other diseases of the circulatory system: Secondary | ICD-10-CM | POA: Insufficient documentation

## 2014-07-09 DIAGNOSIS — J3489 Other specified disorders of nose and nasal sinuses: Secondary | ICD-10-CM | POA: Insufficient documentation

## 2014-07-09 DIAGNOSIS — Z8739 Personal history of other diseases of the musculoskeletal system and connective tissue: Secondary | ICD-10-CM | POA: Insufficient documentation

## 2014-07-09 DIAGNOSIS — Z872 Personal history of diseases of the skin and subcutaneous tissue: Secondary | ICD-10-CM | POA: Insufficient documentation

## 2014-07-09 DIAGNOSIS — Z8719 Personal history of other diseases of the digestive system: Secondary | ICD-10-CM | POA: Insufficient documentation

## 2014-07-09 DIAGNOSIS — Z7982 Long term (current) use of aspirin: Secondary | ICD-10-CM | POA: Insufficient documentation

## 2014-07-09 DIAGNOSIS — Z862 Personal history of diseases of the blood and blood-forming organs and certain disorders involving the immune mechanism: Secondary | ICD-10-CM | POA: Insufficient documentation

## 2014-07-09 LAB — COMPREHENSIVE METABOLIC PANEL
ALK PHOS: 65 U/L (ref 39–117)
ALT: 12 U/L (ref 0–35)
AST: 14 U/L (ref 0–37)
Albumin: 3.6 g/dL (ref 3.5–5.2)
Anion gap: 10 (ref 5–15)
BUN: 15 mg/dL (ref 6–23)
CO2: 28 mEq/L (ref 19–32)
Calcium: 9 mg/dL (ref 8.4–10.5)
Chloride: 103 mEq/L (ref 96–112)
Creatinine, Ser: 0.86 mg/dL (ref 0.50–1.10)
GFR calc non Af Amer: 76 mL/min — ABNORMAL LOW (ref >90)
GFR, EST AFRICAN AMERICAN: 88 mL/min — AB (ref >90)
GLUCOSE: 101 mg/dL — AB (ref 70–99)
POTASSIUM: 4.6 meq/L (ref 3.7–5.3)
Sodium: 141 mEq/L (ref 137–147)
TOTAL PROTEIN: 6.8 g/dL (ref 6.0–8.3)
Total Bilirubin: 0.5 mg/dL (ref 0.3–1.2)

## 2014-07-09 LAB — CBC
HCT: 38.7 % (ref 36.0–46.0)
Hemoglobin: 13.6 g/dL (ref 12.0–15.0)
MCH: 29.6 pg (ref 26.0–34.0)
MCHC: 35.1 g/dL (ref 30.0–36.0)
MCV: 84.1 fL (ref 78.0–100.0)
PLATELETS: 173 10*3/uL (ref 150–400)
RBC: 4.6 MIL/uL (ref 3.87–5.11)
RDW: 13.2 % (ref 11.5–15.5)
WBC: 6.1 10*3/uL (ref 4.0–10.5)

## 2014-07-09 LAB — I-STAT TROPONIN, ED: Troponin i, poc: 0 ng/mL (ref 0.00–0.08)

## 2014-07-09 LAB — D-DIMER, QUANTITATIVE: D-Dimer, Quant: <0.27 ug/mL-FEU (ref 0.00–0.48)

## 2014-07-09 MED ORDER — NAPROXEN 500 MG PO TABS: 500.0000 mg | ORAL_TABLET | Freq: Two times a day (BID) | ORAL | Status: DC

## 2014-07-09 MED ORDER — HYDROCODONE-ACETAMINOPHEN 5-325 MG PO TABS
2.0000 | ORAL_TABLET | Freq: Once | ORAL | Status: AC
  Administered 2014-07-09: 2 via ORAL
  Filled 2014-07-09: qty 2

## 2014-07-09 MED ORDER — ASPIRIN 81 MG PO CHEW
324.0000 mg | CHEWABLE_TABLET | Freq: Once | ORAL | Status: AC
  Administered 2014-07-09: 324 mg via ORAL
  Filled 2014-07-09: qty 4

## 2014-07-09 NOTE — Discharge Instructions (Signed)
Chest Wall Pain °Chest wall pain is pain felt in or around the chest bones and muscles. It may take up to 6 weeks to get better. It may take longer if you are active. Chest wall pain can happen on its own. Other times, things like germs, injury, coughing, or exercise can cause the pain. °HOME CARE  °· Avoid activities that make you tired or cause pain. Try not to use your chest, belly (abdominal), or side muscles. Do not use heavy weights. °· Put ice on the sore area. °¨ Put ice in a plastic bag. °¨ Place a towel between your skin and the bag. °¨ Leave the ice on for 15-20 minutes for the first 2 days. °· Only take medicine as told by your doctor. °GET HELP RIGHT AWAY IF:  °· You have more pain or are very uncomfortable. °· You have a fever. °· Your chest pain gets worse. °· You have new problems. °· You feel sick to your stomach (nauseous) or throw up (vomit). °· You start to sweat or feel lightheaded. °· You have a cough with mucus (phlegm). °· You cough up blood. °MAKE SURE YOU:  °· Understand these instructions. °· Will watch your condition. °· Will get help right away if you are not doing well or get worse. °Document Released: 05/23/2008 Document Revised: 02/27/2012 Document Reviewed: 08/01/2011 °[employer]® Patient Information ©2015 [employer]. This information is not intended to replace advice given to you by your health care provider. Make sure you discuss any questions you have with your health care provider. ° °

## 2014-07-09 NOTE — ED Provider Notes (Signed)
Date: 07/09/2014  Rate: 64  Rhythm: normal sinus rhythm  QRS Axis: normal  Intervals: normal  ST/T Wave abnormalities: normal  Conduction Disutrbances: none  Narrative Interpretation: unremarkable     Teresa Silvernail K Lorell Thibodaux-Rasch, MD 07/09/14 [phone]

## 2014-07-09 NOTE — ED Notes (Signed)
Per EMS: pt states that she was having CP that started this AM. Pt states left sided with no radiation. Pt states increases with movement.

## 2014-07-09 NOTE — ED Notes (Signed)
PA at bedside.

## 2014-07-09 NOTE — ED Provider Notes (Signed)
CSN: [id number]     Arrival date & time 07/09/14  0447 History   First MD Initiated Contact with Patient 07/09/14 [phone]     Chief Complaint  Patient presents with  . Chest Pain     (Consider location/radiation/quality/duration/timing/severity/associated sxs/prior Treatment) HPI Pt is a 53yo female with hx of painful respiration, vitamin D deficiency, myalgia and myositis, mastodynia, palpitations and blood transfusion w/o reported diagnosis presenting to ED with c/o gradually worsening, waxing and waning left sided chest pain that started around 6pm yesterday evening while pt was at home.  Pt states pain pain 3/10 at that time, aching and sore, but around 3:40AM this morning pt was awakened from her sleep with 9/10 left sided chest pain that was sharp in nature.  Denies diaphoresis, n/v, or SOB.  Denies radiation of pain. Pain is under left breast and worse with movement. Denies recent injury but does state she has had intermittent cough and congestion for about 2 weeks.  Denies sick contacts or recent travel. Denies hx of CAD. Denies FH of CAD.  Denies taking pain medication PTA.     Past Medical History  Diagnosis Date  . Herpes simplex without mention of complication   . Unspecified constipation   . Cervicalgia   . Other acne   . Painful respiration   . Seborrhea capitis   . Unspecified sleep apnea   . Unspecified vitamin D deficiency   . Myalgia and myositis, unspecified   . Bunion   . Symptomatic menopausal or female climacteric states   . Other malaise and fatigue   . Mastodynia   . Arrhythmia   . Palpitations   . Blood transfusion without reported diagnosis    Past Surgical History  Procedure Laterality Date  . Laparoscopic hysterectomy      followed by 3 cuff repairs  . Cesarean section     Family History  Problem Relation Age of Onset  . Heart attack Father    History  Substance Use Topics  . Smoking status: Never Smoker   . Smokeless tobacco: Not on file  .  Alcohol Use: Not on file   OB History   Grav Para Term Preterm Abortions TAB SAB Ect Mult Living                 Review of Systems  Constitutional: Negative for fever, chills, diaphoresis and fatigue.  HENT: Positive for congestion.   Respiratory: Positive for cough. Negative for shortness of breath.   Cardiovascular: Positive for chest pain. Negative for palpitations and leg swelling.  Gastrointestinal: Negative for nausea, vomiting, abdominal pain and diarrhea.  All other systems reviewed and are negative.     Allergies  Review of patient's allergies indicates no known allergies.  Home Medications   Prior to Admission medications   Medication Sig Start Date End Date Taking? Authorizing Provider  Armodafinil (NUVIGIL) 250 MG tablet Take 125 mg by mouth daily.    Yes Historical Provider, MD  aspirin 81 MG tablet Take 81 mg by mouth daily.   Yes Historical Provider, MD  ibuprofen (ADVIL,MOTRIN) 200 MG tablet Take 200 mg by mouth daily.   Yes Historical Provider, MD  naproxen (NAPROSYN) 500 MG tablet Take 1 tablet (500 mg total) by mouth 2 (two) times daily. 07/09/14   Noland Fordyce, PA-C   BP 119/75  Pulse 64  Temp(Src) 98 F (36.7 C) (Oral)  Resp 20  SpO2 99% Physical Exam  Nursing note and vitals reviewed. Constitutional: She appears well-developed  and well-nourished. No distress.  Pt lying in exam bed comfortably watching television. NAD  HENT:  Head: Normocephalic and atraumatic.  Mouth/Throat: Oropharynx is clear and moist.  Eyes: Conjunctivae are normal. No scleral icterus.  Neck: Normal range of motion. Neck supple.  Cardiovascular: Normal rate, regular rhythm and normal heart sounds.   Regular rate and rhythm  Pulmonary/Chest: Effort normal and breath sounds normal. No respiratory distress. She has no wheezes. She has no rales. She exhibits no tenderness.  No respiratory distress, able to speak in full sentences w/o difficulty. Lungs: CTAB.  Left anterior-lateral  chest wall tenderness. No flail chest or crepitus.   Abdominal: Soft. Bowel sounds are normal. She exhibits no distension and no mass. There is no tenderness. There is no rebound and no guarding.  Musculoskeletal: Normal range of motion.  Neurological: She is alert.  Skin: Skin is warm and dry. She is not diaphoretic.    ED Course  Procedures (including critical care time) Labs Review Labs Reviewed  COMPREHENSIVE METABOLIC PANEL - Abnormal; Notable for the following:    Glucose, Bld 101 (*)    GFR calc non Af Amer 76 (*)    GFR calc Af Amer 88 (*)    All other components within normal limits  CBC  D-DIMER, QUANTITATIVE  I-STAT TROPOININ, ED    Imaging Review Dg Chest 2 View  07/09/2014   CLINICAL DATA:  Pain under the left breast.  EXAM: CHEST  2 VIEW  COMPARISON:  None.  FINDINGS: The lungs are well-aerated. Minimal left basilar opacity likely reflects atelectasis. There is no evidence of pleural effusion or pneumothorax.  The heart is normal in size; the mediastinal contour is within normal limits. No acute osseous abnormalities are seen.  IMPRESSION: Minimal left basilar opacity likely reflects atelectasis; lungs otherwise clear. No displaced rib fracture seen.   Electronically Signed   By: Garald Balding M.D.   On: 07/09/2014 05:54     EKG Interpretation   Date/Time:  Wednesday July 09 2014 04:58:06 EDT Ventricular Rate:  64 PR Interval:  169 QRS Duration: 78 QT Interval:  381 QTC Calculation: 393 R Axis:   32 Text Interpretation:  Sinus rhythm Confirmed by [facility]  MD, APRIL  ([id number]) on 07/09/2014 6:22:24 AM      MDM   Final diagnoses:  Left-sided chest wall pain    Low suspicion for ACS and with negative EKG and troponin with >8hrs symptoms rules out ACS.  Due to pt having pleuritic chest pain and being >50yo will get D-dimer to r/o PE.    D-dimer: negative. Will tx pain as chest-wall/musculoskeletal pain.  Pain did improve in ED with asiprin and norco.   Will discharge home with naproxen. Advised to f/u with PCP next week for recheck of symptoms. Return precautions provided. Pt verbalized understanding and agreement with tx plan.     Noland Fordyce, PA-C 07/09/14 [place], PA-C 07/09/14 2111

## 2014-07-10 NOTE — ED Provider Notes (Signed)
Medical screening examination/treatment/procedure(s) were performed by non-physician practitioner and as supervising physician I was immediately available for consultation/collaboration.   EKG Interpretation   Date/Time:  Wednesday July 09 2014 04:58:06 EDT Ventricular Rate:  64 PR Interval:  169 QRS Duration: 78 QT Interval:  381 QTC Calculation: 393 R Axis:   32 Text Interpretation:  Sinus rhythm Confirmed by [facility]  MD, Trella Thurmond  ([id number]) on 07/09/2014 6:22:24 AM       Kaulin Chaves Alfonso Patten, MD 07/10/14 0005

## 2014-10-13 ENCOUNTER — Ambulatory Visit: Payer: Self-pay | Admitting: Podiatry

## 2014-10-23 ENCOUNTER — Ambulatory Visit (INDEPENDENT_AMBULATORY_CARE_PROVIDER_SITE_OTHER): Payer: BC Managed Care – PPO | Admitting: Podiatry

## 2014-10-23 ENCOUNTER — Ambulatory Visit (INDEPENDENT_AMBULATORY_CARE_PROVIDER_SITE_OTHER): Payer: BC Managed Care – PPO

## 2014-10-23 ENCOUNTER — Encounter: Payer: Self-pay | Admitting: Podiatry

## 2014-10-23 VITALS — BP 111/76 | HR 82 | Resp 16 | Ht 65.0 in | Wt 185.0 lb

## 2014-10-23 DIAGNOSIS — M21611 Bunion of right foot: Secondary | ICD-10-CM

## 2014-10-23 DIAGNOSIS — M779 Enthesopathy, unspecified: Secondary | ICD-10-CM

## 2014-10-23 DIAGNOSIS — M2011 Hallux valgus (acquired), right foot: Secondary | ICD-10-CM

## 2014-10-23 NOTE — Patient Instructions (Signed)
Pre-Operative Instructions  Congratulations, you have decided to take an important step to improving your quality of life.  You can be assured that the doctors of [facility] will be with you every step of the way.  1. Plan to be at the surgery center/hospital at least 1 (one) hour prior to your scheduled time unless otherwise directed by the surgical center/hospital staff.  You must have a responsible adult accompany you, remain during the surgery and drive you home.  Make sure you have directions to the surgical center/hospital and know how to get there on time. 2. For hospital based surgery you will need to obtain a history and physical form from your family physician within 1 month prior to the date of surgery- we will give you a form for you primary physician.  3. We make every effort to accommodate the date you request for surgery.  There are however, times where surgery dates or times have to be moved.  We will contact you as soon as possible if a change in schedule is required.   4. No Aspirin/Ibuprofen for one week before surgery.  If you are on aspirin, any non-steroidal anti-inflammatory medications (Mobic, Aleve, Ibuprofen) you should stop taking it 7 days prior to your surgery.  You make take Tylenol  For pain prior to surgery.  5. Medications- If you are taking daily heart and blood pressure medications, seizure, reflux, allergy, asthma, anxiety, pain or diabetes medications, make sure the surgery center/hospital is aware before the day of surgery so they may notify you which medications to take or avoid the day of surgery. 6. No food or drink after midnight the night before surgery unless directed otherwise by surgical center/hospital staff. 7. No alcoholic beverages 24 hours prior to surgery.  No smoking 24 hours prior to or 24 hours after surgery. 8. Wear loose pants or shorts- loose enough to fit over bandages, boots, and casts. 9. No slip on shoes, sneakers are best. 10. Bring  your boot with you to the surgery center/hospital.  Also bring crutches or a walker if your physician has prescribed it for you.  If you do not have this equipment, it will be provided for you after surgery. 11. If you have not been contracted by the surgery center/hospital by the day before your surgery, call to confirm the date and time of your surgery. 12. Leave-time from work may vary depending on the type of surgery you have.  Appropriate arrangements should be made prior to surgery with your employer. 13. Prescriptions will be provided immediately following surgery by your doctor.  Have these filled as soon as possible after surgery and take the medication as directed. 14. Remove nail polish on the operative foot. 15. Wash the night before surgery.  The night before surgery wash the foot and leg well with the antibacterial soap provided and water paying special attention to beneath the toenails and in between the toes.  Rinse thoroughly with water and dry well with a towel.  Perform this wash unless told not to do so by your physician.  Enclosed: 1 Ice pack (please put in freezer the night before surgery)   1 Hibiclens skin cleaner   Pre-op Instructions  If you have any questions regarding the instructions, do not hesitate to call our office.  [place]: [address] [place], [place] [address] [phone]  [place]: [address] , [place] [address] [phone]  [place]: [address]  [place], [place] [address] [phone]  Dr. Richard   Tuchman DPM, Dr. Norman Regal DPM Dr. Richard Sikora DPM, Dr. M. Todd Hyatt DPM, Dr. Kathryn Egerton DPM 

## 2014-10-24 NOTE — Progress Notes (Signed)
She presents today with a chief complaint of continued pain to the right foot. She states him ready for surgery and has gotten to the point where it limits my daily activities. I am unable to perform activities that I would normally do on a regular basis. Shoe gear is painful. She denies any changes in her past medical history medications allergies surgery social history and review of systems. She denies fever chills nausea vomiting muscle aches or pains or any trauma to the joint.  Objective: Vital signs are stable she is alert and oriented 3. Strong palpable pulses bilateral. Mild to moderate hallux valgus disorder bilateral right greater than left. Radiographic evaluation does demonstrates an increase in the first intermetatarsal angle greater than normal value. With hallux abductus angle greater than normal. Mild elevated first metatarsophalangeal joint. She also has pain on end range of motion of the second metatarsophalangeal joint which on radiograph demonstrates an elongated second metatarsophalangeal joint with mild early hammertoe deformity.  Assessment: Hallux abductovalgus disorder right greater than left. 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  Plan: Discussed etiology pathology conservative versus surgical therapies. At this point we signed a consent form for an [facility] bunion repair with screw fixation and a second metatarsal osteotomy with screw fixation I answered all of the questions regarding these procedures to the best of my ability in layman's terms. We discussed the possible postop complications which may include but are not limited to postop pain bleeding swelling infection need for further surgery. Overcorrection under correction, loss of digit loss of limb loss of life. We discussed anesthesia and the day surgery center where this will be performed.she was dispensed a Cam Walker for postoperative ambulation and I will follow-up  with her in 1 week.

## 2014-11-04 ENCOUNTER — Other Ambulatory Visit: Payer: Self-pay | Admitting: Podiatry

## 2014-11-04 MED ORDER — CEPHALEXIN 500 MG PO CAPS: 500.0000 mg | ORAL_CAPSULE | Freq: Three times a day (TID) | ORAL | Status: DC

## 2014-11-04 MED ORDER — OXYCODONE-ACETAMINOPHEN 10-325 MG PO TABS: ORAL_TABLET | ORAL | Status: DC

## 2014-11-04 MED ORDER — PROMETHAZINE HCL 25 MG PO TABS: 25.0000 mg | ORAL_TABLET | Freq: Three times a day (TID) | ORAL | Status: DC | PRN

## 2014-11-07 ENCOUNTER — Encounter: Payer: Self-pay | Admitting: Podiatry

## 2014-11-07 DIAGNOSIS — M2011 Hallux valgus (acquired), right foot: Secondary | ICD-10-CM

## 2014-11-07 DIAGNOSIS — M21541 Acquired clubfoot, right foot: Secondary | ICD-10-CM

## 2014-11-07 HISTORY — PX: BUNIONECTOMY: SHX129

## 2014-11-10 ENCOUNTER — Telehealth: Payer: Self-pay

## 2014-11-10 NOTE — Telephone Encounter (Signed)
Spoke with patient regarding post operative status, she states that she is doing well and managing her pain effectively. Advised her to watch out for s/s of infection and bleeding and to keep foot elevated and remain in boot

## 2014-11-11 ENCOUNTER — Ambulatory Visit (INDEPENDENT_AMBULATORY_CARE_PROVIDER_SITE_OTHER): Payer: BC Managed Care – PPO | Admitting: Podiatry

## 2014-11-11 ENCOUNTER — Encounter: Payer: Self-pay | Admitting: Podiatry

## 2014-11-11 ENCOUNTER — Ambulatory Visit (INDEPENDENT_AMBULATORY_CARE_PROVIDER_SITE_OTHER): Payer: BC Managed Care – PPO

## 2014-11-11 VITALS — BP 116/78 | HR 73 | Resp 16

## 2014-11-11 DIAGNOSIS — M2011 Hallux valgus (acquired), right foot: Secondary | ICD-10-CM

## 2014-11-11 DIAGNOSIS — Z9889 Other specified postprocedural states: Secondary | ICD-10-CM

## 2014-11-11 NOTE — Progress Notes (Signed)
Dr Milinda Pointer performed a right [facility] bunion repair with screw and right 2nd metatarsal osteotomy with screw on 11/07/14

## 2014-11-11 NOTE — Progress Notes (Signed)
She presents today 1 week status post [facility] bunion repair right foot second metatarsal osteotomy of the right foot states it is a little sore but she's been doing quite well.  Objective: She presents today in her Cam Walker dry sterile compressive dressing intact. Once removed demonstrates moderate edema no erythema saline as drainage or odor. Good range of motion passive. Radiographic evaluation confirms [facility] bunion repair with screw and pin fixation and a second metatarsal osteotomy Fragments are in good position.  Assessment: Well-healing surgical foot right.  Plan: [place] foot today with a dry sterile compressive dressing encouraged range of motion exercises she will continue to keep the foot dry and clean and I will follow-up with her in 1 week.

## 2014-11-17 ENCOUNTER — Telehealth: Payer: Self-pay | Admitting: *Deleted

## 2014-11-17 MED ORDER — OXYCODONE-ACETAMINOPHEN 10-325 MG PO TABS: ORAL_TABLET | ORAL | Status: DC

## 2014-11-17 NOTE — Telephone Encounter (Signed)
Ran out of pain medication on Thursday, need some . Ok to refill per hyatt

## 2014-11-19 ENCOUNTER — Ambulatory Visit (INDEPENDENT_AMBULATORY_CARE_PROVIDER_SITE_OTHER): Payer: BC Managed Care – PPO | Admitting: Podiatry

## 2014-11-19 VITALS — BP 120/83 | HR 91 | Temp 98.6°F | Resp 16

## 2014-11-19 DIAGNOSIS — Z9889 Other specified postprocedural states: Secondary | ICD-10-CM

## 2014-11-20 NOTE — Progress Notes (Signed)
She presents today 2 weeks status post [facility] bunion repair right foot. She denies fever chills nausea vomiting muscle aches and pains that she is doing pretty good.  Objective: Vital signs are stable she is alert and oriented 3 right foot is moderately edematous. She has good range of motion of the first metatarsophalangeal joint. Sutures are intact and margins appear to be well coapted.  Assessment: Well healing surgical foot right.  Plan: Put her in a compression anklet in a Darco shoe. I will allow some leeway with this foot. I encouraged range of motion exercises and I will follow-up with her in the near future.

## 2014-12-01 ENCOUNTER — Ambulatory Visit (INDEPENDENT_AMBULATORY_CARE_PROVIDER_SITE_OTHER): Payer: BC Managed Care – PPO | Admitting: Podiatry

## 2014-12-01 ENCOUNTER — Encounter: Payer: Self-pay | Admitting: Podiatry

## 2014-12-01 ENCOUNTER — Ambulatory Visit (INDEPENDENT_AMBULATORY_CARE_PROVIDER_SITE_OTHER): Payer: BC Managed Care – PPO

## 2014-12-01 VITALS — BP 121/82 | HR 86 | Resp 16

## 2014-12-01 DIAGNOSIS — M2011 Hallux valgus (acquired), right foot: Secondary | ICD-10-CM

## 2014-12-01 DIAGNOSIS — M21611 Bunion of right foot: Secondary | ICD-10-CM

## 2014-12-01 DIAGNOSIS — Z9889 Other specified postprocedural states: Secondary | ICD-10-CM

## 2014-12-01 NOTE — Progress Notes (Signed)
She presents today for follow-up status post all cemented. Right foot second metatarsal osteotomy right foot. She states that it is doing wonderful.  Objective: Vital signs are stable she is alert and oriented 3. She has great range of motion of the first metatarsophalangeal joint of the right foot though she does have moderate edema right. Radiographic evaluation demonstrates well-healed surgical foot.  Assessment: Well-healed surgical foot right.  Plan: I will her to get back into appear tennis shoes before she is able to go back to work this should approximately the 1 month. I will follow-up with her

## 2014-12-10 ENCOUNTER — Encounter: Payer: Self-pay | Admitting: Podiatry

## 2014-12-23 ENCOUNTER — Telehealth: Payer: Self-pay | Admitting: *Deleted

## 2014-12-23 NOTE — Telephone Encounter (Signed)
Called pt. Pt states im 5 weeks post op and im having a lot of swelling, discomfort, feels like a string in in between my toes day and night, and im wearing my surgical shoe, and i have an appt to come in on Monday. Told pt to elevate, stay off of it, ice and take whatever she takes for headaches. Pt understood.

## 2014-12-23 NOTE — Telephone Encounter (Signed)
Pt states she is 5 weeks post-op and continues to have pain and swelling, please call.

## 2014-12-29 ENCOUNTER — Ambulatory Visit (INDEPENDENT_AMBULATORY_CARE_PROVIDER_SITE_OTHER): Payer: BC Managed Care – PPO

## 2014-12-29 ENCOUNTER — Encounter: Payer: BC Managed Care – PPO | Admitting: Podiatry

## 2014-12-29 ENCOUNTER — Encounter: Payer: Self-pay | Admitting: Podiatry

## 2014-12-29 ENCOUNTER — Ambulatory Visit (INDEPENDENT_AMBULATORY_CARE_PROVIDER_SITE_OTHER): Payer: BC Managed Care – PPO | Admitting: Podiatry

## 2014-12-29 VITALS — BP 122/79 | HR 76 | Resp 16

## 2014-12-29 DIAGNOSIS — M21611 Bunion of right foot: Secondary | ICD-10-CM

## 2014-12-29 DIAGNOSIS — M2011 Hallux valgus (acquired), right foot: Secondary | ICD-10-CM

## 2014-12-29 NOTE — Progress Notes (Signed)
She presents today for follow-up of an [facility] bone repair and second metatarsal osteotomy right foot. She states that the foot is still tender and swollen. She states that I'm only on it for 15 minutes is so swollen hardly wear her shoe.  Objective: Vital signs are stable she is alert and oriented 3. Minimal edema today no erythema cellulitis drainage or odor. Skin lines are closed and there are no open lesions. She has good range of motion though with some limitation first metatarsophalangeal joint right foot. Radiographic evaluation confirms well-healed osteotomy.  Assessment: Well-healed surgical foot right.  Plan: I'm going to encourage physical therapy and contrast baths until she starts back to work and I will follow-up with her in approximately for 6 weeks.

## 2015-01-26 ENCOUNTER — Ambulatory Visit: Payer: BC Managed Care – PPO | Admitting: Podiatry

## 2015-02-04 ENCOUNTER — Ambulatory Visit: Payer: BC Managed Care – PPO | Admitting: Podiatry

## 2015-02-09 ENCOUNTER — Encounter: Payer: Self-pay | Admitting: Podiatry

## 2015-02-09 ENCOUNTER — Ambulatory Visit (INDEPENDENT_AMBULATORY_CARE_PROVIDER_SITE_OTHER): Payer: BC Managed Care – PPO

## 2015-02-09 ENCOUNTER — Ambulatory Visit (INDEPENDENT_AMBULATORY_CARE_PROVIDER_SITE_OTHER): Payer: BC Managed Care – PPO | Admitting: Podiatry

## 2015-02-09 VITALS — BP 123/76 | HR 86 | Resp 12

## 2015-02-09 DIAGNOSIS — M2011 Hallux valgus (acquired), right foot: Secondary | ICD-10-CM

## 2015-02-09 DIAGNOSIS — Z9889 Other specified postprocedural states: Secondary | ICD-10-CM

## 2015-02-09 NOTE — Progress Notes (Signed)
She presents today for her final postop visit regarding her right foot. She is status post [facility] bunion repair with screw and pain in right and screw second metatarsal osteotomy right foot. She states this still gets sore sometimes.  Objective: Vital signs are stable she is alert and oriented 3 she has great range of motion with minimal edema no erythema cellulitis drainage or odor. Radiograph confirms 100% healed osteotomy.  Assessment: Well-healing surgical foot alignment is perfect.  Plan: I encouraged her to increase her activity and range of motion exercises.

## 2015-05-28 ENCOUNTER — Telehealth: Payer: Self-pay

## 2015-05-28 DIAGNOSIS — G4730 Sleep apnea, unspecified: Principal | ICD-10-CM

## 2015-05-28 DIAGNOSIS — G4710 Hypersomnia, unspecified: Secondary | ICD-10-CM

## 2015-05-28 MED ORDER — NUVIGIL 250 MG PO TABS: 250.0000 mg | ORAL_TABLET | Freq: Every day | ORAL | Status: DC

## 2015-05-28 NOTE — Telephone Encounter (Signed)
Call patient to pick up Nuvigil prescription in office.  Patient also requesting a refill of her Cymbalta but in the lower dose of 30 mg due to the 60 mg was making her feel weird and nausea.

## 2015-05-29 MED ORDER — DULOXETINE HCL 30 MG PO CPEP: 30.0000 mg | ORAL_CAPSULE | Freq: Every day | ORAL | Status: DC

## 2015-05-29 NOTE — Telephone Encounter (Signed)
Sent 30mg 

## 2015-05-29 NOTE — Telephone Encounter (Signed)
She can open the capsules of cymbalta and take half of the beads for about one week and try going up again.

## 2015-05-29 NOTE — Telephone Encounter (Signed)
Patient is requesting the tablets instead of the capsules of Cymbalta and states it would be easier for to just do the 30 mg tablets. Cause she will not remember to take out the beads every day.

## 2015-05-29 NOTE — Telephone Encounter (Signed)
Patient notified when picking up her Nuvigil prescription in office.

## 2015-07-13 ENCOUNTER — Encounter: Payer: Self-pay | Admitting: Family Medicine

## 2015-07-13 DIAGNOSIS — G4710 Hypersomnia, unspecified: Secondary | ICD-10-CM | POA: Insufficient documentation

## 2015-07-13 DIAGNOSIS — K5909 Other constipation: Secondary | ICD-10-CM | POA: Insufficient documentation

## 2015-07-13 DIAGNOSIS — M797 Fibromyalgia: Secondary | ICD-10-CM | POA: Insufficient documentation

## 2015-07-13 DIAGNOSIS — G4700 Insomnia, unspecified: Secondary | ICD-10-CM | POA: Insufficient documentation

## 2015-07-13 DIAGNOSIS — E559 Vitamin D deficiency, unspecified: Secondary | ICD-10-CM | POA: Insufficient documentation

## 2015-07-13 DIAGNOSIS — G4730 Sleep apnea, unspecified: Secondary | ICD-10-CM

## 2015-07-13 DIAGNOSIS — B009 Herpesviral infection, unspecified: Secondary | ICD-10-CM | POA: Insufficient documentation

## 2015-07-13 DIAGNOSIS — F330 Major depressive disorder, recurrent, mild: Secondary | ICD-10-CM | POA: Insufficient documentation

## 2015-07-23 ENCOUNTER — Ambulatory Visit: Payer: Self-pay | Admitting: Family Medicine

## 2015-07-30 ENCOUNTER — Ambulatory Visit (INDEPENDENT_AMBULATORY_CARE_PROVIDER_SITE_OTHER): Payer: BC Managed Care – PPO | Admitting: Family Medicine

## 2015-07-30 ENCOUNTER — Encounter: Payer: Self-pay | Admitting: Family Medicine

## 2015-07-30 ENCOUNTER — Encounter (INDEPENDENT_AMBULATORY_CARE_PROVIDER_SITE_OTHER): Payer: Self-pay

## 2015-07-30 VITALS — BP 112/78 | HR 93 | Temp 98.2°F | Resp 16 | Ht 63.0 in | Wt 196.9 lb

## 2015-07-30 DIAGNOSIS — E669 Obesity, unspecified: Secondary | ICD-10-CM

## 2015-07-30 DIAGNOSIS — F329 Major depressive disorder, single episode, unspecified: Secondary | ICD-10-CM | POA: Diagnosis not present

## 2015-07-30 DIAGNOSIS — G4710 Hypersomnia, unspecified: Secondary | ICD-10-CM | POA: Diagnosis not present

## 2015-07-30 DIAGNOSIS — M797 Fibromyalgia: Secondary | ICD-10-CM

## 2015-07-30 DIAGNOSIS — G4730 Sleep apnea, unspecified: Secondary | ICD-10-CM | POA: Diagnosis not present

## 2015-07-30 DIAGNOSIS — F411 Generalized anxiety disorder: Secondary | ICD-10-CM | POA: Insufficient documentation

## 2015-07-30 DIAGNOSIS — Z1239 Encounter for other screening for malignant neoplasm of breast: Secondary | ICD-10-CM

## 2015-07-30 DIAGNOSIS — G4700 Insomnia, unspecified: Secondary | ICD-10-CM | POA: Diagnosis not present

## 2015-07-30 DIAGNOSIS — Z9289 Personal history of other medical treatment: Secondary | ICD-10-CM | POA: Insufficient documentation

## 2015-07-30 DIAGNOSIS — F32A Depression, unspecified: Secondary | ICD-10-CM

## 2015-07-30 MED ORDER — NUVIGIL 250 MG PO TABS: 250.0000 mg | ORAL_TABLET | Freq: Every day | ORAL | Status: DC

## 2015-07-30 MED ORDER — TRAZODONE HCL 50 MG PO TABS: 50.0000 mg | ORAL_TABLET | Freq: Every evening | ORAL | Status: DC

## 2015-07-30 MED ORDER — DULOXETINE HCL 60 MG PO CPEP: 60.0000 mg | ORAL_CAPSULE | Freq: Every day | ORAL | Status: DC

## 2015-07-30 MED ORDER — NAPROXEN 500 MG PO TABS: 500.0000 mg | ORAL_TABLET | Freq: Two times a day (BID) | ORAL | Status: DC

## 2015-07-30 MED ORDER — METAXALONE 800 MG PO TABS: 800.0000 mg | ORAL_TABLET | Freq: Three times a day (TID) | ORAL | Status: DC

## 2015-07-30 NOTE — Progress Notes (Signed)
Name: Teresa Gallagher   MRN: [id number]    DOB: 07-07-1961   Date:07/30/2015       Progress Note  Subjective  Chief Complaint  Chief Complaint  Patient presents with  . Medication Refill    3 month F/U  . Depression    Worsen due to work stress  . Insomnia    Started back taking Trazodone- Improving   . Pain    Joint Pain all over body- worsen    HPI  Depression/Anxiety: she states she went back to work two weeks ago and her stress level is higher. She states her boss is very mean at [employer] and her depression, anxiety and pain has increased.  Insomnia: she states she needs to take Trazodone to help her fall and stay asleep  OSA: she wears her CPAP most of the time, but still feels tired during the day and has to take Nuvigil to stay awake during the day  FMS: she has generalized body aches, feels tired all the time, mental fogginess, she has been taking Ibuprofen, but would like to go back on Naproxen, she is afraid of adding a new medication. Only taking Duloxetine 30mg , states taking higher dose of Duloxetine makes her sleepy and advised her to take 45 mg of 60 mg at night.   Obesity: she has been gaining weight and is making her upset, she states her stress is affecting her eating habits, not feeling full, and is not as active because of FMS pain  Patient Active Problem List   Diagnosis Date Noted  . History of blood transfusion 07/30/2015  . Anxiety 07/30/2015  . Chronic constipation 07/13/2015  . Insomnia 07/13/2015  . Clinical depression 07/13/2015  . Fibromyalgia syndrome 07/13/2015  . 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 Obesity (BMI 30-39.9) 07/13/2015  . Hypersomnia with sleep apnea 07/13/2015  . Vitamin D deficiency 07/13/2015  . Chest pain 01/25/2013    Past Surgical History  Procedure Laterality Date  . Laparoscopic hysterectomy      followed by 3 cuff repairs  . Cesarean section    . Refractive surgery Bilateral   . Tonsillectomy     . Lipoma excision  2012  . Carpal tunnel release Left 2012  . Bunionectomy Right 11/07/2014    Dr. Hulda Humphrey at [facility]     Family History  Problem Relation Age of Onset  . Heart attack Father     Social History   Social History  . Marital Status: Single    Spouse Name: N/A  . Number of Children: N/A  . Years of Education: N/A   Occupational History  . Not on file.   Social History Main Topics  . Smoking status: Never Smoker   . Smokeless tobacco: Never Used  . Alcohol Use: No  . Drug Use: No  . Sexual Activity: Not Currently   Other Topics Concern  . Not on file   Social History Narrative     Current outpatient prescriptions:  .  NUVIGIL 250 MG tablet, Take 1 tablet (250 mg total) by mouth daily., Disp: 30 tablet, Rfl: 2 .  traZODone (DESYREL) 50 MG tablet, Take 1 tablet (50 mg total) by mouth every evening., Disp: 30 tablet, Rfl: 2 .  aspirin 81 MG tablet, Take 81 mg by mouth daily., Disp: , Rfl:  .  Cholecalciferol (VITAMIN D) 2000 UNITS tablet, Take 1 tablet by mouth daily., Disp: , Rfl:  .  DULoxetine (CYMBALTA) 60  MG capsule, Take 1 capsule (60 mg total) by mouth daily., Disp: 30 capsule, Rfl: 2 .  naproxen (NAPROSYN) 500 MG tablet, Take 1 tablet (500 mg total) by mouth 2 (two) times daily., Disp: 60 tablet, Rfl: 2 .  valACYclovir (VALTREX) 500 MG tablet, Take 1 tablet by mouth daily., Disp: , Rfl:   No Known Allergies   ROS  Constitutional: Negative for fever she has weight change.  Respiratory: Negative for cough and shortness of breath.   Cardiovascular: Negative for chest pain or palpitations.  Gastrointestinal: Negative for abdominal pain, no bowel changes.  Musculoskeletal: Negative for gait problem or joint swelling.  Skin: Negative for rash.  Neurological: Negative for dizziness or headache.  No other specific complaints in a complete review of systems (except as listed in HPI above).  Objective  Filed Vitals:   07/30/15 1615  BP:  112/78  Pulse: 93  Temp: 98.2 F (36.8 C)  TempSrc: Oral  Resp: 16  Height: 5\' 3"  (1.6 m)  Weight: 196 lb 14.4 oz (89.313 kg)  SpO2: 96%    Body mass index is 34.89 kg/(m^2).  Physical Exam  Constitutional: Patient appears well-developed and well-nourished. Obese  No distress.  HEENT: head atraumatic, normocephalic, pupils equal and reactive to light, neck supple, throat within normal limits Cardiovascular: Normal rate, regular rhythm and normal heart sounds.  No murmur heard. No BLE edema. Pulmonary/Chest: Effort normal and breath sounds normal. No respiratory distress. Abdominal: Soft.  There is no tenderness. Psychiatric: Patient has a normal mood and affect. behavior is normal. Judgment and thought content normal. Muscular Skeletal: trigger point positive throughout    PHQ2/9: Depression screen PHQ 2/9 07/30/2015  Decreased Interest 3  Down, Depressed, Hopeless 3  PHQ - 2 Score 6  Altered sleeping 3  Tired, decreased energy 3  Change in appetite 3  Feeling bad or failure about yourself  0  Trouble concentrating 3  Moving slowly or fidgety/restless 3  Suicidal thoughts 0  PHQ-9 Score 21  Difficult doing work/chores Very difficult   Adjust medication today  Fall Risk: Fall Risk  07/30/2015  Falls in the past year? No      Assessment & Plan  1. Sleep apnea with hypersomnolence  Continue medication, needs to use CPAP every night - NUVIGIL 250 MG tablet; Take 1 tablet (250 mg total) by mouth daily.  Dispense: 30 tablet; Refill: 2  2. Obesity (BMI 30-39.9)  Discussed diet and exercise, also medication but she wants to hold off for now  3. 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  Dispense: 30 capsule; Refill: 2 -referral therapist   4. Insomnia  Continue medication   - traZODone (DESYREL) 50 MG tablet; Take 1 tablet (50 mg total) by mouth every evening.  Dispense: 30 tablet; Refill: 2  5. Fibromyalgia syndrome  Worse, resume Naproxen, increase dose of Duloxetine and she wants to hold off on gabapentin or lyrica this time. Also advised massage therapy and she will contact her insurance to see if they will pay for it - naproxen (NAPROSYN) 500 MG tablet; Take 1 tablet (500 mg total) by mouth 2 (two) times daily.  Dispense: 60 tablet; Refill: 2 - metaxalone (SKELAXIN) 800 MG tablet; Take 1 tablet (800 mg total) by mouth 3 (three) times daily.  Dispense: 90 tablet; Refill: 2  6. Breast cancer screening  - MM Digital Screening; Future

## 2015-10-14 ENCOUNTER — Telehealth: Payer: Self-pay | Admitting: Family Medicine

## 2015-10-14 NOTE — Telephone Encounter (Signed)
Patient has appointment with you on 11-05-15. She is requesting a referral for a sleep study (sleep[facility]). States that since the company is booking out so far in advance she would like for you to go ahead and schedule her appointment and that way the results will be in time for her appointment.

## 2015-10-15 NOTE — Telephone Encounter (Signed)
Called patient to let her know there needs to be documentation in the note for the need of sleep study in order for ins. To cover, we will discuss at her next appt.

## 2015-11-05 ENCOUNTER — Ambulatory Visit: Payer: BC Managed Care – PPO | Admitting: Family Medicine

## 2015-11-05 ENCOUNTER — Encounter: Payer: Self-pay | Admitting: Family Medicine

## 2015-11-05 ENCOUNTER — Ambulatory Visit (INDEPENDENT_AMBULATORY_CARE_PROVIDER_SITE_OTHER): Payer: BC Managed Care – PPO | Admitting: Family Medicine

## 2015-11-05 ENCOUNTER — Encounter: Payer: Self-pay | Admitting: *Deleted

## 2015-11-05 VITALS — BP 116/74 | HR 103 | Temp 99.0°F | Resp 18 | Ht 63.0 in | Wt 199.1 lb

## 2015-11-05 DIAGNOSIS — Z13220 Encounter for screening for lipoid disorders: Secondary | ICD-10-CM

## 2015-11-05 DIAGNOSIS — N6320 Unspecified lump in the left breast, unspecified quadrant: Secondary | ICD-10-CM

## 2015-11-05 DIAGNOSIS — G4710 Hypersomnia, unspecified: Secondary | ICD-10-CM | POA: Diagnosis not present

## 2015-11-05 DIAGNOSIS — N63 Unspecified lump in breast: Secondary | ICD-10-CM

## 2015-11-05 DIAGNOSIS — E669 Obesity, unspecified: Secondary | ICD-10-CM | POA: Diagnosis not present

## 2015-11-05 DIAGNOSIS — G4730 Sleep apnea, unspecified: Secondary | ICD-10-CM

## 2015-11-05 DIAGNOSIS — M797 Fibromyalgia: Secondary | ICD-10-CM | POA: Diagnosis not present

## 2015-11-05 DIAGNOSIS — B009 Herpesviral infection, unspecified: Secondary | ICD-10-CM | POA: Diagnosis not present

## 2015-11-05 DIAGNOSIS — F411 Generalized anxiety disorder: Secondary | ICD-10-CM | POA: Diagnosis not present

## 2015-11-05 DIAGNOSIS — Z23 Encounter for immunization: Secondary | ICD-10-CM | POA: Diagnosis not present

## 2015-11-05 DIAGNOSIS — F330 Major depressive disorder, recurrent, mild: Secondary | ICD-10-CM | POA: Diagnosis not present

## 2015-11-05 DIAGNOSIS — R5383 Other fatigue: Secondary | ICD-10-CM | POA: Diagnosis not present

## 2015-11-05 DIAGNOSIS — G4700 Insomnia, unspecified: Secondary | ICD-10-CM | POA: Diagnosis not present

## 2015-11-05 MED ORDER — ALPRAZOLAM 0.5 MG PO TABS: 0.5000 mg | ORAL_TABLET | Freq: Every evening | ORAL | Status: DC | PRN

## 2015-11-05 MED ORDER — NUVIGIL 250 MG PO TABS: 250.0000 mg | ORAL_TABLET | Freq: Every day | ORAL | Status: DC

## 2015-11-05 MED ORDER — VORTIOXETINE HBR 10 MG PO TABS: 1.0000 | ORAL_TABLET | Freq: Every day | ORAL | Status: DC

## 2015-11-05 NOTE — Progress Notes (Signed)
Name: Teresa Gallagher   MRN: [id number]    DOB: 11-Aug-1961   Date:11/05/2015       Progress Note  Subjective  Chief Complaint  Chief Complaint  Patient presents with  . Follow-up    3 month  . Depression    unchanged  . Fibromyalgia    unchanged  . Cyst    onset 3 month she states growth on the back of right leg , denies any pain  . Breast Problem    concerned about abnormal mammograms and pain    HPI  Major Depression Recurrent: she has been taking half dose of Cymbalta because it makes her feel groggy at full dose. Feels overwhelmed, tired, feels down, sleeping more than usual, no motivation, not resting when sleeping. Having severe anxiety when she first goes to bed at night and has to sleep in the living room. She states she would like to cry but has to hold it in while at work.   Breast mass: found on mammogram, likely benign, but we will refer her to surgeon, no nipple discharge, no breast pain.   FMS: aching all over, foggy mind, no headaches, feels tired all the time  OSA: using CPAP every night, last study 5 years ago, she feels tired all the time, afraid to fall asleep at night.  Skin Tag: on right posterior thigh, it seems to be growing, no pain, no bleeding.    Patient Active Problem List   Diagnosis Date Noted  . History of blood transfusion 07/30/2015  . GAD (generalized anxiety disorder) 07/30/2015  . Chronic constipation 07/13/2015  . Insomnia 07/13/2015  . Depression, major, recurrent, mild ([place]) 07/13/2015  . Fibromyalgia syndrome 07/13/2015  . 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 Obesity (BMI 30-39.9) 07/13/2015  . Hypersomnia with sleep apnea 07/13/2015  . Vitamin D deficiency 07/13/2015  . Chest pain 01/25/2013    Past Surgical History  Procedure Laterality Date  . Laparoscopic hysterectomy      followed by 3 cuff repairs  . Cesarean section    . Refractive surgery Bilateral   . Tonsillectomy    . Lipoma excision  2012  . Carpal  tunnel release Left 2012  . Bunionectomy Right 11/07/2014    Dr. Hulda Humph[facility] at [facility]     Family History  Problem Relation Age of Onset  . Heart attack Father     Social History   Social History  . Marital Status: Single    Spouse Name: N/A  . Number of Children: N/A  . Years of Education: N/A   Occupational History  . Not on file.   Social History Main Topics  . Smoking status: Never Smoker   . Smokeless tobacco: Never Used  . Alcohol Use: No  . Drug Use: No  . Sexual Activity: Not Currently   Other Topics Concern  . Not on file   Social History Narrative     Current outpatient prescriptions:  .  ALPRAZolam (XANAX) 0.5 MG tablet, Take 1 tablet (0.5 mg total) by mouth at bedtime as needed for anxiety., Disp: 30 tablet, Rfl: 0 .  aspirin 81 MG tablet, Take 81 mg by mouth daily., Disp: , Rfl:  .  naproxen (NAPROSYN) 500 MG tablet, Take 1 tablet (500 mg total) by mouth 2 (two) times daily., Disp: 60 tablet, Rfl: 2 .  NUVIGIL 250 MG tablet, Take 1 tablet (250 mg total) by mouth daily., Disp: 30 tablet, Rfl: 2 .  traZODone (DESYREL)  50 MG tablet, Take 1 tablet (50 mg total) by mouth every evening., Disp: 30 tablet, Rfl: 2 .  valACYclovir (VALTREX) 500 MG tablet, Take 1 tablet by mouth daily., Disp: , Rfl:  .  Vortioxetine HBr (TRINTELLIX) 10 MG TABS, Take 1 tablet (10 mg total) by mouth daily., Disp: 30 tablet, Rfl: 0  No Known Allergies   ROS  Constitutional: Negative for fever or weight change.  Respiratory: Negative for cough, positive for shortness of breath.   Cardiovascular: chronic right  Chest wall  pain or palpitations.  Gastrointestinal: Negative for abdominal pain, no bowel changes.  Musculoskeletal: Negative for gait problem or joint swelling.  Skin: Negative for rash.  Neurological: Negative for dizziness or headache.  No other specific complaints in a complete review of systems (except as listed in HPI above).  Objective  Filed Vitals:    11/05/15 1441  BP: 116/74  Pulse: 103  Temp: 99 F (37.2 C)  TempSrc: Oral  Resp: 18  Height: 5\' 3"  (1.6 m)  Weight: 199 lb 1.6 oz (90.311 kg)  SpO2: 96%    Body mass index is 35.28 kg/(m^2).  Physical Exam  Constitutional: Patient appears well-developed and well-nourished. Obese  No distress.  HEENT: head atraumatic, normocephalic, pupils equal and reactive to light,  neck supple, throat within normal limits Cardiovascular: Normal rate, regular rhythm and normal heart sounds.  No murmur heard. No BLE edema. Pulmonary/Chest: Effort normal and breath sounds normal. No respiratory distress. Abdominal: Soft.  There is no tenderness. Muscular Skeletal: 18 trigger point positives Skin: skin tag on right posterior thigh near buttocks Psychiatric: Patient has a normal mood and affect. behavior is normal. Judgment and thought content normal.   PHQ2/9: Depression screen [facility] 2/9 11/05/2015 07/30/2015  Decreased Interest 0 3  Down, Depressed, Hopeless 1 3  PHQ - 2 Score 1 6  Altered sleeping - 3  Tired, decreased energy - 3  Change in appetite - 3  Feeling bad or failure about yourself  - 0  Trouble concentrating - 3  Moving slowly or fidgety/restless - 3  Suicidal thoughts - 0  PHQ-9 Score - 21  Difficult doing work/chores - Very difficult     Fall Risk: Fall Risk  11/05/2015 07/30/2015  Falls in the past year? No No     Functional Status Survey: Is the patient deaf or have difficulty hearing?: No Does the patient have difficulty seeing, even when wearing glasses/contacts?: Yes (reading glasses) Does the patient have difficulty concentrating, remembering, or making decisions?: No Does the patient have difficulty walking or climbing stairs?: No Does the patient have difficulty dressing or bathing?: No Does the patient have difficulty doing errands alone such as visiting a doctor's office or shopping?: No    Assessment & Plan  1. Depression, major, recurrent, mild  ([place])  Stop Cymbalta, we will try Trintellix, return in one month for follow up - Vortioxetine HBr (TRINTELLIX) 10 MG TABS; Take 1 tablet (10 mg total) by mouth daily.  Dispense: 30 tablet; Refill: 0  2. Fibromyalgia syndrome  Off muscle relaxer, taking Naproxen prn  3. Needs flu shot  - Flu Vaccine QUAD 36+ mos PF IM (Fluarix & Fluzone Quad PF)  4. Insomnia  On Trazodone  5. Obesity (BMI 30-39.9)  Discussed with the patient the risk posed by an increased BMI. Discussed importance of portion control, calorie counting and at least 150 minutes of physical activity weekly. Avoid sweet beverages and drink more water. Eat at least 6 servings  of fruit and vegetables daily   6. Sleep apnea with hypersomnolence  -Sleep study - NUVIGIL 250 MG tablet; Take 1 tablet (250 mg total) by mouth daily.  Dispense: 30 tablet; Refill: 2  7. Mass of left breast on mammogram  - Ambulatory referral to General Surgery  8. Herpes simplex type 2 infection  Only one episode past year  9. GAD (generalized anxiety disorder)  - TSH - ALPRAZolam (XANAX) 0.5 MG tablet; Take 1 tablet (0.5 mg total) by mouth at bedtime as needed for anxiety.  Dispense: 30 tablet; Refill: 0  10. Other fatigue  Likely from depression and FMS but we will check for other causes - Comprehensive metabolic panel - CBC with Differential/Platelet - VITAMIN D 25 Hydroxy (Vit-D Deficiency, Fractures) - Vitamin B12  11. Lipid screening  - Lipid panel

## 2015-11-06 ENCOUNTER — Encounter: Payer: Self-pay | Admitting: Family Medicine

## 2015-11-17 ENCOUNTER — Encounter: Payer: Self-pay | Admitting: General Surgery

## 2015-11-18 ENCOUNTER — Ambulatory Visit: Payer: BC Managed Care – PPO | Admitting: General Surgery

## 2015-11-19 ENCOUNTER — Other Ambulatory Visit: Payer: Self-pay

## 2015-11-19 DIAGNOSIS — G4730 Sleep apnea, unspecified: Principal | ICD-10-CM

## 2015-11-19 DIAGNOSIS — G4710 Hypersomnia, unspecified: Secondary | ICD-10-CM

## 2015-11-25 ENCOUNTER — Encounter: Payer: Self-pay | Admitting: General Surgery

## 2015-11-26 ENCOUNTER — Encounter: Payer: Self-pay | Admitting: General Surgery

## 2015-11-26 ENCOUNTER — Ambulatory Visit (INDEPENDENT_AMBULATORY_CARE_PROVIDER_SITE_OTHER): Payer: BC Managed Care – PPO | Admitting: General Surgery

## 2015-11-26 VITALS — BP 133/78 | HR 76 | Resp 14 | Ht 66.0 in | Wt 196.0 lb

## 2015-11-26 DIAGNOSIS — R0781 Pleurodynia: Secondary | ICD-10-CM | POA: Diagnosis not present

## 2015-11-26 NOTE — Progress Notes (Signed)
Patient ID: Teresa Gallagher, female   DOB: Dec 22, 1960, 54 y.o.   MRN: [id number]  Chief Complaint  Patient presents with  . Other    left breast mass    HPI Teresa Gallagher is a 54 y.o. female here for evaluation of a left breast mass. She had her annual mammogram completed on 10/30/15 with added views and an ultrasound of the left breast on 11/04/15. She did not feel the mass prior to her mammogram. She does report some discomfort when she had her ultrasound done, but none since. She does report she is still having pain in her right breast area.   I personally reviewed the patient's clinical history. HPI  Past Medical History  Diagnosis Date  . Herpes simplex without mention of complication   . Unspecified constipation   . Cervicalgia   . Other acne   . Painful respiration   . Seborrhea capitis   . Unspecified sleep apnea   . Unspecified vitamin D deficiency   . Myalgia and myositis, unspecified   . Bunion   . Symptomatic menopausal or female climacteric states   . Other malaise and fatigue   . Mastodynia   . Arrhythmia   . Palpitations   . Blood transfusion without reported diagnosis   . Acne   . Vitamin D deficiency   . Depression     Past Surgical History  Procedure Laterality Date  . Laparoscopic hysterectomy      followed by 3 cuff repairs  . Cesarean section    . Refractive surgery Bilateral   . Tonsillectomy    . Lipoma excision  2012  . Carpal tunnel release Left 2012  . Bunionectomy Right 11/07/2014    Dr. [facility]ulda [facility] at [facility]     Family History  Problem Relation Age of Onset  . Heart attack Father     Social History Social History  Substance Use Topics  . Smoking status: Never Smoker   . Smokeless tobacco: Never Used  . Alcohol Use: No    No Known Allergies  Current Outpatient Prescriptions  Medication Sig Dispense Refill  . ALPRAZolam (XANAX) 0.5 MG tablet Take 1 tablet (0.5 mg total) by mouth at bedtime as needed for anxiety. 30  tablet 0  . aspirin 81 MG tablet Take 81 mg by mouth daily.    . naproxen (NAPROSYN) 500 MG tablet Take 1 tablet (500 mg total) by mouth 2 (two) times daily. 60 tablet 2  . NUVIGIL 250 MG tablet Take 1 tablet (250 mg total) by mouth daily. 30 tablet 2  . traZODone (DESYREL) 50 MG tablet Take 1 tablet (50 mg total) by mouth every evening. 30 tablet 2  . valACYclovir (VALTREX) 500 MG tablet Take 1 tablet by mouth daily.    . Vortioxetine HBr (TRINTELLIX) 10 MG TABS Take 1 tablet (10 mg total) by mouth daily. 30 tablet 0   No current facility-administered medications for this visit.    Review of Systems Review of Systems  Constitutional: Negative.   Respiratory: Negative.   Cardiovascular: Negative.     Blood pressure 133/78, pulse 76, resp. rate 14, height 5\' 6"  (1.676 m), weight 196 lb (88.905 kg).  Physical Exam Physical Exam  Constitutional: She is oriented to person, place, and time. She appears well-developed and well-nourished.  Eyes: Conjunctivae are normal. No scleral icterus.  Neck: Neck supple.  Cardiovascular: Normal rate, regular rhythm and normal heart sounds.   Pulmonary/Chest: Effort normal and breath sounds normal. Right  breast exhibits tenderness. Right breast exhibits no inverted nipple, no mass, no nipple discharge and no skin change. Left breast exhibits no inverted nipple, no mass, no nipple discharge, no skin change and no tenderness.    Lymphadenopathy:    She has no cervical adenopathy.    She has no axillary adenopathy.  Neurological: She is alert and oriented to person, place, and time.  Skin: Skin is warm and dry.  Psychiatric: She has a normal mood and affect.    Data Reviewed Bilateral mammograms dated 10/30/2015 and 11/04/2015 were not available for review. The report described a 5 mm nodule which on ultrasound was felt to represent a lymph node. One year follow-up recommended.  Ultrasound of the soft tissues dated 03/05/2012 had suggested prominent  lymph nodes within the right axilla measuring up to 1.3 x 3.1 cm.   CT of the neck and chest dated 03/27/2012 obtained when the patient first reported palpable swelling in the right axilla as well as in the right neck was re-reviewed. No adenopathy. No muscle mass asymmetry. No evidence of rib abnormality. No vascular lesions.  Assessment    Unexplained chest wall pain.    Plan    Anti-inflammatory's and local heat are the only recommendations I have at present. No indication for reimaging.    PCP: Frazier Butt 11/27/2015, 7:56 AM

## 2015-11-26 NOTE — Patient Instructions (Signed)
Try taking Aleve for discomfort. Continue self breast exams. Call office for any new breast issues or concerns.

## 2015-11-27 DIAGNOSIS — R0781 Pleurodynia: Secondary | ICD-10-CM | POA: Insufficient documentation

## 2015-12-12 LAB — CBC WITH DIFFERENTIAL/PLATELET
BASOS ABS: 0 10*3/uL (ref 0.0–0.2)
Basos: 0 %
EOS (ABSOLUTE): 0.1 10*3/uL (ref 0.0–0.4)
Eos: 2 %
HEMOGLOBIN: 14.3 g/dL (ref 11.1–15.9)
Hematocrit: 41 % (ref 34.0–46.6)
IMMATURE GRANS (ABS): 0 10*3/uL (ref 0.0–0.1)
IMMATURE GRANULOCYTES: 0 %
LYMPHS: 37 %
Lymphocytes Absolute: 2.3 10*3/uL (ref 0.7–3.1)
MCH: 29.1 pg (ref 26.6–33.0)
MCHC: 34.9 g/dL (ref 31.5–35.7)
MCV: 83 fL (ref 79–97)
Monocytes Absolute: 0.4 10*3/uL (ref 0.1–0.9)
Monocytes: 7 %
Neutrophils Absolute: 3.5 10*3/uL (ref 1.4–7.0)
Neutrophils: 54 %
PLATELETS: 211 10*3/uL (ref 150–379)
RBC: 4.92 x10E6/uL (ref 3.77–5.28)
RDW: 14.8 % (ref 12.3–15.4)
WBC: 6.4 10*3/uL (ref 3.4–10.8)

## 2015-12-12 LAB — LIPID PANEL
CHOL/HDL RATIO: 3.3 ratio (ref 0.0–4.4)
Cholesterol, Total: 190 mg/dL (ref 100–199)
HDL: 57 mg/dL (ref >39)
LDL Calculated: 120 mg/dL — ABNORMAL HIGH (ref 0–99)
TRIGLYCERIDES: 65 mg/dL (ref 0–149)
VLDL CHOLESTEROL CAL: 13 mg/dL (ref 5–40)

## 2015-12-12 LAB — COMPREHENSIVE METABOLIC PANEL
A/G RATIO: 1.7 (ref 1.1–2.5)
ALK PHOS: 78 IU/L (ref 39–117)
ALT: 12 IU/L (ref 0–32)
AST: 17 IU/L (ref 0–40)
Albumin: 4.2 g/dL (ref 3.5–5.5)
BILIRUBIN TOTAL: 0.7 mg/dL (ref 0.0–1.2)
BUN/Creatinine Ratio: 15 (ref 9–23)
BUN: 13 mg/dL (ref 6–24)
CO2: 26 mmol/L (ref 18–29)
CREATININE: 0.86 mg/dL (ref 0.57–1.00)
Calcium: 9.1 mg/dL (ref 8.7–10.2)
Chloride: 103 mmol/L (ref 96–106)
GFR, EST AFRICAN AMERICAN: 88 mL/min/{1.73_m2} (ref >59)
GFR, EST NON AFRICAN AMERICAN: 76 mL/min/{1.73_m2} (ref >59)
GLUCOSE: 85 mg/dL (ref 65–99)
Globulin, Total: 2.5 g/dL (ref 1.5–4.5)
POTASSIUM: 4.9 mmol/L (ref 3.5–5.2)
SODIUM: 142 mmol/L (ref 134–144)
Total Protein: 6.7 g/dL (ref 6.0–8.5)

## 2015-12-12 LAB — TSH: TSH: 1.22 u[IU]/mL (ref 0.450–4.500)

## 2015-12-12 LAB — VITAMIN B12: VITAMIN B 12: 697 pg/mL (ref 211–946)

## 2015-12-12 LAB — VITAMIN D 25 HYDROXY (VIT D DEFICIENCY, FRACTURES): VIT D 25 HYDROXY: 15.2 ng/mL — AB (ref 30.0–100.0)

## 2015-12-13 ENCOUNTER — Other Ambulatory Visit: Payer: Self-pay | Admitting: Family Medicine

## 2015-12-13 MED ORDER — VITAMIN D (ERGOCALCIFEROL) 1.25 MG (50000 UNIT) PO CAPS: 50000.0000 [IU] | ORAL_CAPSULE | ORAL | Status: DC

## 2015-12-15 ENCOUNTER — Ambulatory Visit (INDEPENDENT_AMBULATORY_CARE_PROVIDER_SITE_OTHER): Payer: BC Managed Care – PPO | Admitting: Family Medicine

## 2015-12-15 ENCOUNTER — Encounter: Payer: Self-pay | Admitting: Family Medicine

## 2015-12-15 VITALS — BP 106/74 | HR 105 | Temp 98.7°F | Resp 16 | Ht 66.0 in | Wt 199.8 lb

## 2015-12-15 DIAGNOSIS — R739 Hyperglycemia, unspecified: Secondary | ICD-10-CM | POA: Diagnosis not present

## 2015-12-15 DIAGNOSIS — F330 Major depressive disorder, recurrent, mild: Secondary | ICD-10-CM

## 2015-12-15 DIAGNOSIS — Z0000 Encounter for general adult medical examination without abnormal findings: Secondary | ICD-10-CM

## 2015-12-15 DIAGNOSIS — Z01419 Encounter for gynecological examination (general) (routine) without abnormal findings: Secondary | ICD-10-CM

## 2015-12-15 MED ORDER — NALTREXONE-BUPROPION HCL ER 8-90 MG PO TB12: 2.0000 | ORAL_TABLET | Freq: Two times a day (BID) | ORAL | Status: DC

## 2015-12-15 NOTE — Progress Notes (Signed)
Name: Teresa Gallagher   MRN: [id number]    DOB: [birth year]   Date:12/15/2015       Progress Note  Subjective  Chief Complaint  Chief Complaint  Patient presents with  . Annual Exam    HPI  Well Woman: s/p hysterectomy, mammogram is up to date. Denies dysuria, she still has some urinary urgency but not as often now.   Obesity: has hyperglycemia, increase in abdominal girth, BMI 37 , willing to lose weight, she will join a gym, trying to eat healthy but would like to try medications  Major Depression Recurrent: she has been taking half dose of Cymbalta because it makes her feel groggy at full dose, she was given Trintellix last visit but she was afraid to try it. She is still feeling  overwhelmed, tired, feels down,  no motivation, not resting when sleeping, but no longer sleeping during the day. Having severe anxiety when she first goes to bed at night and has to sleep in the living room. She states she would like to cry but has to hold it in while at work. She went back for sleep study, and slept well while wearing the new mask there, waiting for the insurance to approve it. She is still grieving the loss of her uncle that died in 06-06-2023, but also worried about her 10 yo son that has Down's syndrome and is having some mental decline. She denies suicidal thoughts or ideation   OSA: waiting for the new mask.   Patient Active Problem List   Diagnosis Date Noted  . Rib pain on right side 11/27/2015  . History of blood transfusion 07/30/2015  . GAD (generalized anxiety disorder) 07/30/2015  . Chronic constipation 07/13/2015  . Insomnia 07/13/2015  . Depression, major, recurrent, mild ([place]) 07/13/2015  . Fibromyalgia syndrome 07/13/2015  . 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 Morbid obesity ([place]) 07/13/2015  . Hypersomnia with sleep apnea 07/13/2015  . Vitamin D deficiency 07/13/2015  . Chest pain 01/25/2013    Past Surgical History  Procedure Laterality Date  . Laparoscopic  hysterectomy      followed by 3 cuff repairs  . Cesarean section    . Refractive surgery Bilateral   . Tonsillectomy    . Lipoma excision  2012  . Carpal tunnel release Left 2012  . Bunionectomy Right 11/07/2014    Dr. Hulda [facility] at [facility]     Family History  Problem Relation Age of Onset  . Heart attack Father     Social History   Social History  . Marital Status: Divorced    Spouse Name: N/A  . Number of Children: N/A  . Years of Education: N/A   Occupational History  . Not on file.   Social History Main Topics  . Smoking status: Never Smoker   . Smokeless tobacco: Never Used  . Alcohol Use: No  . Drug Use: No  . Sexual Activity: Not Currently   Other Topics Concern  . Not on file   Social History Narrative     Current outpatient prescriptions:  .  ALPRAZolam (XANAX) 0.5 MG tablet, Take 1 tablet (0.5 mg total) by mouth at bedtime as needed for anxiety., Disp: 30 tablet, Rfl: 0 .  aspirin 81 MG tablet, Take 81 mg by mouth daily., Disp: , Rfl:  .  NUVIGIL 250 MG tablet, Take 1 tablet (250 mg total) by mouth daily., Disp: 30 tablet, Rfl: 2 .  traZODone (DESYREL) 50 MG tablet, Take  1 tablet (50 mg total) by mouth every evening., Disp: 30 tablet, Rfl: 2 .  valACYclovir (VALTREX) 500 MG tablet, Take 1 tablet by mouth daily., Disp: , Rfl:  .  Vitamin D, Ergocalciferol, (DRISDOL) 50000 UNITS CAPS capsule, Take 1 capsule (50,000 Units total) by mouth every 7 (seven) days., Disp: 12 capsule, Rfl: 0 .  Naltrexone-Bupropion HCl ER (CONTRAVE) 8-90 MG TB12, Take 2 tablets by mouth 2 (two) times daily., Disp: 120 tablet, Rfl: 0 .  naproxen (NAPROSYN) 500 MG tablet, Take 1 tablet (500 mg total) by mouth 2 (two) times daily. (Patient not taking: Reported on 12/15/2015), Disp: 60 tablet, Rfl: 2  No Known Allergies   ROS  Constitutional: Negative for fever or weight change.  Respiratory: Negative for cough and shortness of breath.   Cardiovascular: Negative for chest  pain or palpitations.  Gastrointestinal: Negative for abdominal pain, no bowel changes.  Musculoskeletal: Negative for gait problem or joint swelling.  Skin: Negative for rash.  Neurological: Negative for dizziness or headache.  No other specific complaints in a complete review of systems (except as listed in HPI above).  Objective  Filed Vitals:   12/15/15 1150  BP: 106/74  Pulse: 105  Temp: 98.7 F (37.1 C)  TempSrc: Oral  Resp: 16  Height: 5\' 6"  (1.676 m)  Weight: 199 lb 12.8 oz (90.629 kg)  SpO2: 96%    Body mass index is 32.26 kg/(m^2).  Physical Exam  Constitutional: Patient appears well-developed and obese. No distress.  HENT: Head: Normocephalic and atraumatic. Ears: B TMs ok, no erythema or effusion; Nose: Nose normal. Mouth/Throat: Oropharynx is clear and moist. No oropharyngeal exudate.  Eyes: Conjunctivae and EOM are normal. Pupils are equal, round, and reactive to light. No scleral icterus.  Neck: Normal range of motion. Neck supple. No JVD present. No thyromegaly present.  Cardiovascular: Normal rate, regular rhythm and normal heart sounds.  No murmur heard. No BLE edema. Pulmonary/Chest: Effort normal and breath sounds normal. No respiratory distress. Abdominal: Soft. Bowel sounds are normal, no distension. There is no tenderness. no masses Breast: no lumps or masses, no nipple discharge or rashes FEMALE GENITALIA:  Normal external genitalia RECTAL: not done Musculoskeletal: Normal range of motion, no joint effusions. No gross deformities Neurological: he is alert and oriented to person, place, and time. No cranial nerve deficit. Coordination, balance, strength, speech and gait are normal.  Skin: Skin is warm and dry. No rash noted. No erythema.  Psychiatric: Patient has a normal mood and affect. behavior is normal. Judgment and thought content normal.  Recent Results (from the past 2160 hour(s))  Lipid panel     Status: Abnormal   Collection Time: 12/11/15  11:37 AM  Result Value Ref Range   Cholesterol, Total 190 100 - 199 mg/dL   Triglycerides 65 0 - 149 mg/dL   HDL 57 >39 mg/dL   VLDL Cholesterol Cal 13 5 - 40 mg/dL   LDL Calculated 120 (H) 0 - 99 mg/dL   Chol/HDL Ratio 3.3 0.0 - 4.4 ratio units    Comment:                                   T. Chol/HDL Ratio  Men  Women                               1/2 Avg.Risk  3.4    3.3                                   Avg.Risk  5.0    4.4                                2X Avg.Risk  9.6    7.1                                3X Avg.Risk 23.4   11.0   Comprehensive metabolic panel     Status: None   Collection Time: 12/11/15 11:37 AM  Result Value Ref Range   Glucose 85 65 - 99 mg/dL   BUN 13 6 - 24 mg/dL   Creatinine, Ser 0.86 0.57 - 1.00 mg/dL   GFR calc non Af Amer 76 >59 mL/min/1.73   GFR calc Af Amer 88 >59 mL/min/1.73   BUN/Creatinine Ratio 15 9 - 23   Sodium 142 134 - 144 mmol/L   Potassium 4.9 3.5 - 5.2 mmol/L   Chloride 103 96 - 106 mmol/L   CO2 26 18 - 29 mmol/L   Calcium 9.1 8.7 - 10.2 mg/dL   Total Protein 6.7 6.0 - 8.5 g/dL   Albumin 4.2 3.5 - 5.5 g/dL   Globulin, Total 2.5 1.5 - 4.5 g/dL   Albumin/Globulin Ratio 1.7 1.1 - 2.5   Bilirubin Total 0.7 0.0 - 1.2 mg/dL   Alkaline Phosphatase 78 39 - 117 IU/L   AST 17 0 - 40 IU/L   ALT 12 0 - 32 IU/L  CBC with Differential/Platelet     Status: None   Collection Time: 12/11/15 11:37 AM  Result Value Ref Range   WBC 6.4 3.4 - 10.8 x10E3/uL   RBC 4.92 3.77 - 5.28 x10E6/uL   Hemoglobin 14.3 11.1 - 15.9 g/dL   Hematocrit 41.0 34.0 - 46.6 %   MCV 83 79 - 97 fL   MCH 29.1 26.6 - 33.0 pg   MCHC 34.9 31.5 - 35.7 g/dL   RDW 14.8 12.3 - 15.4 %   Platelets 211 150 - 379 x10E3/uL   Neutrophils 54 %   Lymphs 37 %   Monocytes 7 %   Eos 2 %   Basos 0 %   Neutrophils Absolute 3.5 1.4 - 7.0 x10E3/uL   Lymphocytes Absolute 2.3 0.7 - 3.1 x10E3/uL   Monocytes Absolute 0.4 0.1 - 0.9 x10E3/uL    EOS (ABSOLUTE) 0.1 0.0 - 0.4 x10E3/uL   Basophils Absolute 0.0 0.0 - 0.2 x10E3/uL   Immature Granulocytes 0 %   Immature Grans (Abs) 0.0 0.0 - 0.1 x10E3/uL  VITAMIN D 25 Hydroxy (Vit-D Deficiency, Fractures)     Status: Abnormal   Collection Time: 12/11/15 11:37 AM  Result Value Ref Range   Vit D, 25-Hydroxy 15.2 (L) 30.0 - 100.0 ng/mL    Comment: Vitamin D deficiency has been defined by the Institute of Medicine and an Endocrine Society practice guideline as a level of serum 25-OH vitamin D less than 20 ng/mL (1,2). The Endocrine Society went on to further  define vitamin D insufficiency as a level between 21 and 29 ng/mL (2). 1. IOM (Institute of Medicine). 2010. Dietary reference    intakes for calcium and D. [place]: The    [employer]. 2. Holick MF, Binkley [place], Bischoff-Ferrari HA, et al.    Evaluation, treatment, and prevention of vitamin D    deficiency: an Endocrine Society clinical practice    guideline. JCEM. 2011 Jul; 96(7):1911-30.   Vitamin B12     Status: None   Collection Time: 12/11/15 11:37 AM  Result Value Ref Range   Vitamin B-12 697 211 - 946 pg/mL  TSH     Status: None   Collection Time: 12/11/15 11:37 AM  Result Value Ref Range   TSH 1.220 0.450 - 4.500 uIU/mL   PHQ2/9: Depression screen [facility] 2/9 11/05/2015 07/30/2015  Decreased Interest 0 3  Down, Depressed, Hopeless 1 3  PHQ - 2 Score 1 6  Altered sleeping - 3  Tired, decreased energy - 3  Change in appetite - 3  Feeling bad or failure about yourself  - 0  Trouble concentrating - 3  Moving slowly or fidgety/restless - 3  Suicidal thoughts - 0  PHQ-9 Score - 21  Difficult doing work/chores - Very difficult    Fall Risk: Fall Risk  11/05/2015 07/30/2015  Falls in the past year? No No   Assessment & Plan  1. Well woman exam  Discussed importance of 150 minutes of physical activity weekly, eat two servings of fish weekly, eat one serving of tree nuts ( cashews, pistachios,  pecans, almonds.Marland Kitchen) every other day, eat 6 servings of fruit/vegetables daily and drink plenty of water and avoid sweet beverages.   2. Hyperglycemia  Labs from last year, we will try to add hgbA1C to her labs - Hemoglobin A1c  3. Morbid obesity due to excess calories ([place])  Discussed all optiosns for weight loss medications including Belviq, Qsymia, Saxenda and Contrave. Discussed risk and benefits of each of them. - Naltrexone-Bupropion HCl ER (CONTRAVE) 8-90 MG TB12; Take 2 tablets by mouth 2 (two) times daily.  Dispense: 120 tablet; Refill: 0   4. Depression, major, recurrent, mild ([place])  Refusing medication  - Ambulatory referral to Psychology

## 2015-12-16 LAB — HGB A1C W/O EAG: Hgb A1c MFr Bld: 5.7 % — ABNORMAL HIGH (ref 4.8–5.6)

## 2015-12-18 LAB — SPECIMEN STATUS REPORT

## 2015-12-31 ENCOUNTER — Encounter: Payer: Self-pay | Admitting: Family Medicine

## 2016-01-26 ENCOUNTER — Ambulatory Visit: Payer: BC Managed Care – PPO | Admitting: Family Medicine

## 2016-01-28 ENCOUNTER — Ambulatory Visit: Payer: BC Managed Care – PPO | Admitting: Family Medicine

## 2016-06-24 ENCOUNTER — Other Ambulatory Visit: Payer: Self-pay | Admitting: Family Medicine

## 2016-10-10 ENCOUNTER — Telehealth: Payer: Self-pay | Admitting: Family Medicine

## 2016-10-10 NOTE — Telephone Encounter (Signed)
Patient requesting refill of Nuvigil to [facility].

## 2016-10-11 NOTE — Telephone Encounter (Signed)
Left voice message for patient to call back to schedule appointment.

## 2016-11-25 ENCOUNTER — Encounter: Payer: Self-pay | Admitting: Family Medicine

## 2016-11-25 ENCOUNTER — Ambulatory Visit (INDEPENDENT_AMBULATORY_CARE_PROVIDER_SITE_OTHER): Payer: BC Managed Care – PPO | Admitting: Family Medicine

## 2016-11-25 VITALS — BP 116/78 | HR 100 | Temp 99.0°F | Resp 16 | Ht 66.0 in | Wt 190.3 lb

## 2016-11-25 DIAGNOSIS — B009 Herpesviral infection, unspecified: Secondary | ICD-10-CM

## 2016-11-25 DIAGNOSIS — G4710 Hypersomnia, unspecified: Secondary | ICD-10-CM | POA: Diagnosis not present

## 2016-11-25 DIAGNOSIS — M797 Fibromyalgia: Secondary | ICD-10-CM | POA: Diagnosis not present

## 2016-11-25 DIAGNOSIS — K5732 Diverticulitis of large intestine without perforation or abscess without bleeding: Secondary | ICD-10-CM | POA: Diagnosis not present

## 2016-11-25 DIAGNOSIS — Z23 Encounter for immunization: Secondary | ICD-10-CM | POA: Diagnosis not present

## 2016-11-25 DIAGNOSIS — G4730 Sleep apnea, unspecified: Secondary | ICD-10-CM | POA: Diagnosis not present

## 2016-11-25 DIAGNOSIS — R6889 Other general symptoms and signs: Secondary | ICD-10-CM | POA: Diagnosis not present

## 2016-11-25 LAB — POCT INFLUENZA A/B
INFLUENZA B, POC: NEGATIVE
Influenza A, POC: NEGATIVE

## 2016-11-25 MED ORDER — METRONIDAZOLE 500 MG PO TABS: 500.0000 mg | ORAL_TABLET | Freq: Three times a day (TID) | ORAL | 0 refills | Status: DC

## 2016-11-25 MED ORDER — VALACYCLOVIR HCL 500 MG PO TABS: 500.0000 mg | ORAL_TABLET | Freq: Every day | ORAL | 12 refills | Status: DC

## 2016-11-25 MED ORDER — NUVIGIL 250 MG PO TABS: 250.0000 mg | ORAL_TABLET | Freq: Every day | ORAL | 2 refills | Status: DC

## 2016-11-25 MED ORDER — VALACYCLOVIR HCL 500 MG PO TABS: 500.0000 mg | ORAL_TABLET | Freq: Two times a day (BID) | ORAL | 1 refills | Status: DC | PRN

## 2016-11-25 MED ORDER — CIPROFLOXACIN HCL 500 MG PO TABS: 500.0000 mg | ORAL_TABLET | Freq: Two times a day (BID) | ORAL | 0 refills | Status: DC

## 2016-11-25 MED ORDER — NAPROXEN 500 MG PO TABS: 500.0000 mg | ORAL_TABLET | Freq: Two times a day (BID) | ORAL | 2 refills | Status: DC

## 2016-11-25 NOTE — Progress Notes (Signed)
Name: Teresa Gallagher   MRN: [id number]    DOB: 10-Sep-1961   Date:11/25/2016       Progress Note  Subjective  Chief Complaint  Chief Complaint  Patient presents with  . Headache    pt having flu like symptoms since last night  . Generalized Body Aches    HPI  Flu like symptoms: she has been sick since yesterday. She noticed frontal headache, sneezing, clear rhinorrhea yesterday. Today increase in bowel movement frequency, chills and fatigue  LLQ pain: she denies previous history of diverticulitis, she states today she has noticed that LLQ feels sore, and has noticed soft stools times 4 today, no blood in stools, no nausea, or decrease in appetite, she has chills but no fever  FMS: she has a long history of FMS , not seen since last year, she has been taking nsaid's prn and states Naproxen works the best for her. Denies mental fogginess and states depression and anxiety has been under control. Sleep is better and no longer taking medication for it.   Herpes Genital: taking Valtrex prn only, but is willing to take it daily, had 3 outbreaks since last year.   OSA: she is out of Nuvigil, she wears CPAP most nights of the week, sometimes she forgets before she falls asleep, but still feels tired during the day and would like a refill of her medication   Patient Active Problem List   Diagnosis Date Noted  . Rib pain on right side 11/27/2015  . History of blood transfusion 07/30/2015  . GAD (generalized anxiety disorder) 07/30/2015  . Chronic constipation 07/13/2015  . Insomnia 07/13/2015  . Depression, major, recurrent, mild ([place]) 07/13/2015  . Fibromyalgia syndrome 07/13/2015  . 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 Morbid obesity ([place]) 07/13/2015  . Hypersomnia with sleep apnea 07/13/2015  . Vitamin D deficiency 07/13/2015  . Chest pain 01/25/2013    Past Surgical History:  Procedure Laterality Date  . BUNIONECTOMY Right 11/07/2014   Dr. Hulda Humphrey at [facility]    . CARPAL TUNNEL RELEASE Left 2012  . CESAREAN SECTION    . LAPAROSCOPIC HYSTERECTOMY     followed by 3 cuff repairs  . LIPOMA EXCISION  2012  . REFRACTIVE SURGERY Bilateral   . TONSILLECTOMY      Family History  Problem Relation Age of Onset  . Heart attack Father     Social History   Social History  . Marital status: Divorced    Spouse name: N/A  . Number of children: N/A  . Years of education: N/A   Occupational History  . Not on file.   Social History Main Topics  . Smoking status: Never Smoker  . Smokeless tobacco: Never Used  . Alcohol use No  . Drug use: No  . Sexual activity: Not Currently   Other Topics Concern  . Not on file   Social History Narrative  . No narrative on file     Current Outpatient Prescriptions:  .  aspirin 81 MG tablet, Take 81 mg by mouth daily., Disp: , Rfl:  .  naproxen (NAPROSYN) 500 MG tablet, Take 1 tablet (500 mg total) by mouth 2 (two) times daily., Disp: 60 tablet, Rfl: 2 .  NUVIGIL 250 MG tablet, Take 1 tablet (250 mg total) by mouth daily., Disp: 30 tablet, Rfl: 2 .  valACYclovir (VALTREX) 500 MG tablet, Take 1 tablet (500 mg total) by mouth 2 (two) times daily as needed., Disp: 30 tablet, Rfl:  1  No Known Allergies   ROS  Constitutional: Negative for fever or weight change.  Respiratory: Negative for cough and shortness of breath.   Cardiovascular: Negative for chest pain or palpitations.  Gastrointestinal: Negative for abdominal pain, no bowel changes.  Musculoskeletal: Negative for gait problem or joint swelling.  Skin: Negative for rash.  Neurological: Negative for dizziness, positive for  headache.  No other specific complaints in a complete review of systems (except as listed in HPI above).  Objective  Vitals:   11/25/16 1434  BP: 116/78  Pulse: 100  Resp: 16  Temp: 99 F (37.2 C)  TempSrc: Oral  SpO2: 93%  Weight: 190 lb 5 oz (86.3 kg)  Height: 5\' 6"  (1.676 m)    Body mass index is 30.72  kg/m.  Physical Exam  Constitutional: Patient appears well-developed and well-nourished. Obese  No distress.  HEENT: head atraumatic, normocephalic, pupils equal and reactive to light, ears normal TM bilaterally, boggy turbinates, neck supple, throat within normal limits Cardiovascular: Normal rate, regular rhythm and normal heart sounds.  No murmur heard. No BLE edema. Pulmonary/Chest: Effort normal and breath sounds normal. No respiratory distress. Abdominal: Soft.  There is  Tenderness on LLQ, she refuses rectal exam, no rebound tenderness, normal bowel sounds Psychiatric: Patient has a normal mood and affect. behavior is normal. Judgment and thought content normal. Muscular Skeletal: trigger point positive   Recent Results (from the past 2160 hour(s))  POCT Influenza A/B     Status: Normal   Collection Time: 11/25/16  2:44 PM  Result Value Ref Range   Influenza A, POC Negative Negative   Influenza B, POC Negative Negative      PHQ2/9: Depression screen [facility] 2/9 11/05/2015 07/30/2015  Decreased Interest 0 3  Down, Depressed, Hopeless 1 3  PHQ - 2 Score 1 6  Altered sleeping - 3  Tired, decreased energy - 3  Change in appetite - 3  Feeling bad or failure about yourself  - 0  Trouble concentrating - 3  Moving slowly or fidgety/restless - 3  Suicidal thoughts - 0  PHQ-9 Score - 21  Difficult doing work/chores - Very difficult     Fall Risk: Fall Risk  11/05/2015 07/30/2015  Falls in the past year? No No      Assessment & Plan  1. Flu-like symptoms  - POCT Influenza A/B - likely having symptoms from mild diverticulitis  2. Sleep apnea with hypersomnolence  - NUVIGIL 250 MG tablet; Take 1 tablet (250 mg total) by mouth daily.  Dispense: 30 tablet; Refill: 2  3. Herpes simplex type 2 infection  - valACYclovir (VALTREX) 500 MG tablet; Take 1 tablet (500 mg total) by mouth daily. And twice daily for outbreaks  Dispense: 35 tablet; Refill: 12  4. Needs flu  shot  - Flu Vaccine QUAD 36+ mos IM  5. Fibromyalgia syndrome  - naproxen (NAPROSYN) 500 MG tablet; Take 1 tablet (500 mg total) by mouth 2 (two) times daily.  Dispense: 60 tablet; Refill: 2  6. Diverticulitis of large intestine without perforation or abscess without bleeding  We will treat empirically with antibiotics, advised to eat a liquid diet for the next 24-48 hours , go to [facility] if worsening of pain or blood in stools.  - ciprofloxacin (CIPRO) 500 MG tablet; Take 1 tablet (500 mg total) by mouth 2 (two) times daily.  Dispense: 14 tablet; Refill: 0 - metroNIDAZOLE (FLAGYL) 500 MG tablet; Take 1 tablet (500 mg total) by mouth 3 (  three) times daily.  Dispense: 21 tablet; Refill: 0

## 2016-11-25 NOTE — Patient Instructions (Signed)
Diverticulitis °Diverticulitis is when small pockets that have formed in your colon (large intestine) become infected or swollen. °Follow these instructions at home: °· Follow your doctor's instructions. °· Follow a special diet if told by your doctor. °· When you feel better, your doctor may tell you to change your diet. You may be told to eat a lot of fiber. Fruits and vegetables are good sources of fiber. Fiber makes it easier to poop (have bowel movements). °· Take supplements or probiotics as told by your doctor. °· Only take medicines as told by your doctor. °· Keep all follow-up visits with your doctor. °Contact a doctor if: °· Your pain does not get better. °· You have a hard time eating food. °· You are not pooping like normal. °Get help right away if: °· Your pain gets worse. °· Your problems do not get better. °· Your problems suddenly get worse. °· You have a fever. °· You keep throwing up (vomiting). °· You have bloody or black, tarry poop (stool). °This information is not intended to replace advice given to you by your health care provider. Make sure you discuss any questions you have with your health care provider. °Document Released: 05/23/2008 Document Revised: 05/12/2016 Document Reviewed: 10/30/2013 °Elsevier Interactive Patient Education © 2017 [employer] ° °

## 2016-12-15 ENCOUNTER — Other Ambulatory Visit: Payer: Self-pay | Admitting: Family Medicine

## 2016-12-15 DIAGNOSIS — R928 Other abnormal and inconclusive findings on diagnostic imaging of breast: Secondary | ICD-10-CM

## 2016-12-20 ENCOUNTER — Ambulatory Visit (INDEPENDENT_AMBULATORY_CARE_PROVIDER_SITE_OTHER): Payer: BC Managed Care – PPO | Admitting: Family Medicine

## 2016-12-20 ENCOUNTER — Encounter: Payer: Self-pay | Admitting: Family Medicine

## 2016-12-20 VITALS — BP 118/67 | HR 94 | Temp 98.2°F | Resp 16 | Ht 66.0 in | Wt 194.7 lb

## 2016-12-20 DIAGNOSIS — Z0001 Encounter for general adult medical examination with abnormal findings: Secondary | ICD-10-CM

## 2016-12-20 DIAGNOSIS — G44219 Episodic tension-type headache, not intractable: Secondary | ICD-10-CM | POA: Diagnosis not present

## 2016-12-20 DIAGNOSIS — Z79899 Other long term (current) drug therapy: Secondary | ICD-10-CM

## 2016-12-20 DIAGNOSIS — Z131 Encounter for screening for diabetes mellitus: Secondary | ICD-10-CM | POA: Diagnosis not present

## 2016-12-20 DIAGNOSIS — Z01419 Encounter for gynecological examination (general) (routine) without abnormal findings: Secondary | ICD-10-CM

## 2016-12-20 DIAGNOSIS — E559 Vitamin D deficiency, unspecified: Secondary | ICD-10-CM | POA: Diagnosis not present

## 2016-12-20 DIAGNOSIS — Z13220 Encounter for screening for lipoid disorders: Secondary | ICD-10-CM

## 2016-12-20 NOTE — Progress Notes (Signed)
Name: Teresa Gallagher   MRN: [id number]    DOB: 28-Sep-1961   Date:12/20/2016       Progress Note  Subjective  Chief Complaint  Chief Complaint  Patient presents with  . Annual Exam    CPE    HPI  Well woman: she states that she has not been sexually active and denies vaginal symptoms at this time. No longer has chest pain, but she continues to have right breast pain ( seen by surgeon in the past and thought to be muscular skeletal but insurance denied MRI coverage). She is having mammogram tomorrow. No change bowel movements.   Headache: she states very seldom use to have a headache, but over the past couple of months, she has noticed dull headache on right side of face and temporal area, that can last a few hours and resolves when she takes Ibuprofen. It does not wake her up during the night. She denies phonophobia, photophobia, or eye symptoms. She has increased fluid intake. She states symptoms seems to be worse at work when she is stressed.    Patient Active Problem List   Diagnosis Date Noted  . Rib pain on right side 11/27/2015  . History of blood transfusion 07/30/2015  . GAD (generalized anxiety disorder) 07/30/2015  . Chronic constipation 07/13/2015  . Insomnia 07/13/2015  . Depression, major, recurrent, mild ([place]) 07/13/2015  . Fibromyalgia syndrome 07/13/2015  . 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 Morbid obesity ([place]) 07/13/2015  . Hypersomnia with sleep apnea 07/13/2015  . Vitamin D deficiency 07/13/2015    Past Surgical History:  Procedure Laterality Date  . BUNIONECTOMY Right 11/07/2014   Dr. Hulda Humphrey at [facility]   . CARPAL TUNNEL RELEASE Left 2012  . CESAREAN SECTION    . LAPAROSCOPIC HYSTERECTOMY     followed by 3 cuff repairs  . LIPOMA EXCISION  2012  . REFRACTIVE SURGERY Bilateral   . TONSILLECTOMY      Family History  Problem Relation Age of Onset  . Heart attack Father     Social History   Social History  . Marital status:  Divorced    Spouse name: N/A  . Number of children: N/A  . Years of education: N/A   Occupational History  . Not on file.   Social History Main Topics  . Smoking status: Never Smoker  . Smokeless tobacco: Never Used  . Alcohol use No  . Drug use: No  . Sexual activity: Not Currently   Other Topics Concern  . Not on file   Social History Narrative  . No narrative on file     Current Outpatient Prescriptions:  .  aspirin 81 MG tablet, Take 81 mg by mouth daily., Disp: , Rfl:  .  naproxen (NAPROSYN) 500 MG tablet, Take 1 tablet (500 mg total) by mouth 2 (two) times daily., Disp: 60 tablet, Rfl: 2 .  NUVIGIL 250 MG tablet, Take 1 tablet (250 mg total) by mouth daily., Disp: 30 tablet, Rfl: 2 .  valACYclovir (VALTREX) 500 MG tablet, Take 1 tablet (500 mg total) by mouth daily. And twice daily for outbreaks, Disp: 35 tablet, Rfl: 12  No Known Allergies   ROS  Constitutional: Negative for fever or significant weight change.  Respiratory: Negative for cough and shortness of breath.   Cardiovascular: Negative for chest pain or palpitations.  Gastrointestinal: Negative for abdominal pain, no bowel changes.  Musculoskeletal: Negative for gait problem or joint swelling.  Skin: Negative for rash.  Neurological: Negative for dizziness, positive for  Headache.  No other specific complaints in a complete review of systems (except as listed in HPI above).  Objective  Vitals:   12/20/16 1408  BP: 118/67  Pulse: 94  Resp: 16  Temp: 98.2 F (36.8 C)  TempSrc: Oral  SpO2: 96%  Weight: 194 lb 11.2 oz (88.3 kg)  Height: 5\' 6"  (1.676 m)    Body mass index is 31.43 kg/m.  Physical Exam  Constitutional: Patient appears well-developed and obese. No distress.  HENT: Head: Normocephalic and atraumatic. Ears: B TMs ok, no erythema or effusion; Nose: Nose normal. Mouth/Throat: Oropharynx is clear and moist. No oropharyngeal exudate.  Eyes: Conjunctivae and EOM are normal. Pupils are  equal, round, and reactive to light. No scleral icterus.  Neck: Normal range of motion. Neck supple. No JVD present. No thyromegaly present.  Cardiovascular: Normal rate, regular rhythm and normal heart sounds.  No murmur heard. No BLE edema. Pulmonary/Chest: Effort normal and breath sounds normal. No respiratory distress. Abdominal: Soft. Bowel sounds are normal, no distension. There is no tenderness. no masses Breast: no lumps or masses, no nipple discharge or rashes FEMALE GENITALIA:  Normal external genitalia RECTAL: not done Musculoskeletal: Normal range of motion, no joint effusions. No gross deformities Neurological: he is alert and oriented to person, place, and time. No cranial nerve deficit. Coordination, balance, strength, speech and gait are normal.  Skin: Skin is warm and dry. No rash noted. No erythema.  Psychiatric: Patient has a normal mood and affect. behavior is normal. Judgment and thought content normal.  Recent Results (from the past 2160 hour(s))  POCT Influenza A/B     Status: Normal   Collection Time: 11/25/16  2:44 PM  Result Value Ref Range   Influenza A, POC Negative Negative   Influenza B, POC Negative Negative     PHQ2/9: Depression screen [facility] 2/9 12/20/2016 11/05/2015 07/30/2015  Decreased Interest 0 0 3  Down, Depressed, Hopeless 0 1 3  PHQ - 2 Score 0 1 6  Altered sleeping - - 3  Tired, decreased energy - - 3  Change in appetite - - 3  Feeling bad or failure about yourself  - - 0  Trouble concentrating - - 3  Moving slowly or fidgety/restless - - 3  Suicidal thoughts - - 0  PHQ-9 Score - - 21  Difficult doing work/chores - - Very difficult     Fall Risk: Fall Risk  12/20/2016 11/05/2015 07/30/2015  Falls in the past year? No No No     Functional Status Survey: Is the patient deaf or have difficulty hearing?: No Does the patient have difficulty seeing, even when wearing glasses/contacts?: Yes (reading glasses) Does the patient have difficulty  concentrating, remembering, or making decisions?: No Does the patient have difficulty walking or climbing stairs?: No Does the patient have difficulty dressing or bathing?: No Does the patient have difficulty doing errands alone such as visiting a doctor's office or shopping?: No    Assessment & Plan  1. Well woman exam  Discussed importance of 150 minutes of physical activity weekly, eat two servings of fish weekly, eat one serving of tree nuts ( cashews, pistachios, pecans, almonds.[place] Kitchen) every other day, eat 6 servings of fruit/vegetables daily and drink plenty of water and avoid sweet beverages.  - COMPLETE METABOLIC PANEL WITH GFR - CBC with Differential/Platelet - TSH  2. Lipid screening  - Lipid panel  3. Screening for diabetes mellitus (DM)  - Hemoglobin A1c -  Insulin, fasting  4. Vitamin D deficiency  - VITAMIN D 25 Hydroxy (Vit-D Deficiency, Fractures)  5. Morbid obesity (HCC)  - TSH Discussed with the patient the risk posed by an increased BMI. Discussed importance of portion control, calorie counting and at least 150 minutes of physical activity weekly. Avoid sweet beverages and drink more water. Eat at least 6 servings of fruit and vegetables daily   6. Long-term use of high-risk medication    7. Episodic tension-type headache, not intractable  Discussed importance of regular sleep, avoid taking NSAID's right away, try cold compresses first and drinking water before taking medication to avoid rebound headaches

## 2016-12-21 ENCOUNTER — Other Ambulatory Visit: Payer: Self-pay

## 2016-12-21 DIAGNOSIS — M797 Fibromyalgia: Secondary | ICD-10-CM

## 2016-12-21 MED ORDER — NAPROXEN 500 MG PO TABS: 500.0000 mg | ORAL_TABLET | Freq: Two times a day (BID) | ORAL | 0 refills | Status: DC

## 2016-12-21 NOTE — Telephone Encounter (Signed)
Patient requesting refill of Naproxen for a [facility] supply [facility].

## 2016-12-22 ENCOUNTER — Encounter: Payer: Self-pay | Admitting: Family Medicine

## 2016-12-22 LAB — HM MAMMOGRAPHY: HM MAMMO: NORMAL (ref 0–4)

## 2017-03-27 ENCOUNTER — Ambulatory Visit: Payer: BC Managed Care – PPO | Admitting: Family Medicine

## 2017-06-19 ENCOUNTER — Encounter: Payer: Self-pay | Admitting: Family Medicine

## 2017-06-19 ENCOUNTER — Ambulatory Visit (INDEPENDENT_AMBULATORY_CARE_PROVIDER_SITE_OTHER): Payer: BC Managed Care – PPO | Admitting: Family Medicine

## 2017-06-19 VITALS — BP 126/82 | HR 95 | Temp 98.5°F | Resp 16 | Wt 199.8 lb

## 2017-06-19 DIAGNOSIS — M79604 Pain in right leg: Secondary | ICD-10-CM

## 2017-06-19 NOTE — Progress Notes (Signed)
Name: Teresa Gallagher   MRN: [id number]    DOB: August 25, 1961   Date:06/19/2017       Progress Note  Subjective  Chief Complaint  Chief Complaint  Patient presents with  . Leg Swelling    Traveled to [place] and started to experience right leg swelling. When to the ER and a doppler checked which showed Negative for DVT. Patient is now taking baby aspirin and having tightness and pain with her leg.     HPI  Chronic right leg pain: she states that over the past 6-8 weeks she noticed pain on right lower leg, described as aching and tight feeling on right knee and right leg. She states symptoms were stable until she drove to [place] to see her sister - 9 hour drive, right leg was very swollen and she went to [facility] in [place] on June 27th, 2018. Doppler US negative and patient discharged home. She has been trying to elevate leg , took some Naproxen and has been wearing compression stocking hoses occasionally. She has SOB but chronic, no orthopnea, she feels swollen on her hands also. No chest pain or palpitation.    Patient Active Problem List   Diagnosis Date Noted  . Episodic tension-type headache, not intractable 12/20/2016  . Rib pain on right side 11/27/2015  . History of blood transfusion 07/30/2015  . GAD (generalized anxiety disorder) 07/30/2015  . Chronic constipation 07/13/2015  . Insomnia 07/13/2015  . Depression, major, recurrent, mild ([place]) 07/13/2015  . Fibromyalgia syndrome 07/13/2015  . 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 Morbid obesity ([place]) 07/13/2015  . Hypersomnia with sleep apnea 07/13/2015  . Vitamin D deficiency 07/13/2015    Past Surgical History:  Procedure Laterality Date  . BUNIONECTOMY Right 11/07/2014   Dr. Hulda Humphrey at [facility]   . CARPAL TUNNEL RELEASE Left 2012  . CESAREAN SECTION    . LAPAROSCOPIC HYSTERECTOMY     followed by 3 cuff repairs  . LIPOMA EXCISION  2012  . REFRACTIVE SURGERY Bilateral   . TONSILLECTOMY      Family  History  Problem Relation Age of Onset  . Heart attack Father     Social History   Social History  . Marital status: Divorced    Spouse name: N/A  . Number of children: N/A  . Years of education: N/A   Occupational History  . Not on file.   Social History Main Topics  . Smoking status: Never Smoker  . Smokeless tobacco: Never Used  . Alcohol use No  . Drug use: No  . Sexual activity: Not Currently   Other Topics Concern  . Not on file   Social History Narrative  . No narrative on file     Current Outpatient Prescriptions:  .  aspirin 81 MG tablet, Take 81 mg by mouth daily., Disp: , Rfl:  .  naproxen (NAPROSYN) 500 MG tablet, Take 1 tablet (500 mg total) by mouth 2 (two) times daily., Disp: 90 tablet, Rfl: 0 .  NUVIGIL 250 MG tablet, Take 1 tablet (250 mg total) by mouth daily. (Patient taking differently: Take 125 mg by mouth daily. ), Disp: 30 tablet, Rfl: 2 .  valACYclovir (VALTREX) 500 MG tablet, Take 1 tablet (500 mg total) by mouth daily. And twice daily for outbreaks, Disp: 35 tablet, Rfl: 12  No Known Allergies   ROS  Constitutional: Negative for fever or weight change.  Respiratory: Negative for cough but she has  shortness of breath with  activity ( going on for over 6 months) she needs to have labs done  Cardiovascular: Negative for chest pain or palpitations.  Gastrointestinal: Negative for abdominal pain, no bowel changes.  Musculoskeletal: Negative for gait problem , positive for right knee  joint swelling.  Skin: Negative for rash.  Neurological: Negative for dizziness or headache.  No other specific complaints in a complete review of systems (except as listed in HPI above).  Objective  Vitals:   06/19/17 1534  BP: 126/82  Pulse: 95  Resp: 16  Temp: 98.5 F (36.9 C)  TempSrc: Oral  SpO2: 97%  Weight: 199 lb 12.8 oz (90.6 kg)    Body mass index is 32.25 kg/m.  Physical Exam  Constitutional: Patient appears well-developed and  well-nourished. Obese  No distress.  HEENT: head atraumatic, normocephalic, pupils equal and reactive to light,  neck supple, throat within normal limits Cardiovascular: Normal rate, regular rhythm and normal heart sounds.  No murmur heard. 1 plus pitting on left and trace on right Pulmonary/Chest: Effort normal and breath sounds normal. No respiratory distress. Abdominal: Soft.  There is no tenderness. Psychiatric: Patient has a normal mood and affect. behavior is normal. Judgment and thought content normal. Muscular Skeletal: mild right knee effusion, pain during palpation of legs, crepitus with extension of left knee  PHQ2/9: Depression screen [facility] 2/9 06/19/2017 12/20/2016 11/05/2015 07/30/2015  Decreased Interest 0 0 0 3  Down, Depressed, Hopeless 0 0 1 3  PHQ - 2 Score 0 0 1 6  Altered sleeping - - - 3  Tired, decreased energy - - - 3  Change in appetite - - - 3  Feeling bad or failure about yourself  - - - 0  Trouble concentrating - - - 3  Moving slowly or fidgety/restless - - - 3  Suicidal thoughts - - - 0  PHQ-9 Score - - - 21  Difficult doing work/chores - - - Very difficult     Fall Risk: Fall Risk  06/19/2017 12/20/2016 11/05/2015 07/30/2015  Falls in the past year? No No No No     Functional Status Survey: Is the patient deaf or have difficulty hearing?: No Does the patient have difficulty seeing, even when wearing glasses/contacts?: No Does the patient have difficulty concentrating, remembering, or making decisions?: No Does the patient have difficulty walking or climbing stairs?: No Does the patient have difficulty dressing or bathing?: No Does the patient have difficulty doing errands alone such as visiting a doctor's office or shopping?: No    Assessment & Plan  1. Right leg pain  - Ambulatory referral to Orthopedic Surgery

## 2017-06-20 ENCOUNTER — Other Ambulatory Visit: Payer: Self-pay | Admitting: Family Medicine

## 2017-06-20 ENCOUNTER — Telehealth: Payer: Self-pay | Admitting: Family Medicine

## 2017-06-20 LAB — CBC WITH DIFFERENTIAL/PLATELET
Basophils Absolute: 0 cells/uL (ref 0–200)
Basophils Relative: 0 %
Eosinophils Absolute: 134 cells/uL (ref 15–500)
Eosinophils Relative: 2 %
HCT: 38.2 % (ref 35.0–45.0)
HEMOGLOBIN: 12.9 g/dL (ref 11.7–15.5)
LYMPHS PCT: 32 %
Lymphs Abs: 2144 cells/uL (ref 850–3900)
MCH: 29.3 pg (ref 27.0–33.0)
MCHC: 33.8 g/dL (ref 32.0–36.0)
MCV: 86.8 fL (ref 80.0–100.0)
MONOS PCT: 5 %
MPV: 10.8 fL (ref 7.5–12.5)
Monocytes Absolute: 335 cells/uL (ref 200–950)
Neutro Abs: 4087 cells/uL (ref 1500–7800)
Neutrophils Relative %: 61 %
PLATELETS: 230 10*3/uL (ref 140–400)
RBC: 4.4 MIL/uL (ref 3.80–5.10)
RDW: 14.8 % (ref 11.0–15.0)
WBC: 6.7 10*3/uL (ref 3.8–10.8)

## 2017-06-20 NOTE — Telephone Encounter (Signed)
Please reorder labs from earlier this year pt is on the way to do her labs now.

## 2017-06-21 LAB — VITAMIN D 25 HYDROXY (VIT D DEFICIENCY, FRACTURES): VIT D 25 HYDROXY: 17 ng/mL — AB (ref 30–100)

## 2017-06-21 LAB — COMPLETE METABOLIC PANEL WITH GFR
ALBUMIN: 3.9 g/dL (ref 3.6–5.1)
ALK PHOS: 66 U/L (ref 33–130)
ALT: 26 U/L (ref 6–29)
AST: 46 U/L — AB (ref 10–35)
BILIRUBIN TOTAL: 0.5 mg/dL (ref 0.2–1.2)
BUN: 10 mg/dL (ref 7–25)
CALCIUM: 9 mg/dL (ref 8.6–10.4)
CO2: 26 mmol/L (ref 20–31)
CREATININE: 0.86 mg/dL (ref 0.50–1.05)
Chloride: 104 mmol/L (ref 98–110)
GFR, Est African American: 87 mL/min (ref >=60)
GFR, Est Non African American: 76 mL/min (ref >=60)
GLUCOSE: 93 mg/dL (ref 65–99)
POTASSIUM: 4.7 mmol/L (ref 3.5–5.3)
SODIUM: 138 mmol/L (ref 135–146)
TOTAL PROTEIN: 6.2 g/dL (ref 6.1–8.1)

## 2017-06-21 LAB — LIPID PANEL
Cholesterol: 182 mg/dL (ref <200)
HDL: 59 mg/dL (ref >50)
LDL CALC: 106 mg/dL — AB (ref <100)
TRIGLYCERIDES: 87 mg/dL (ref <150)
Total CHOL/HDL Ratio: 3.1 Ratio (ref <5.0)
VLDL: 17 mg/dL (ref <30)

## 2017-06-21 LAB — HEMOGLOBIN A1C
Hgb A1c MFr Bld: 5.2 % (ref <5.7)
MEAN PLASMA GLUCOSE: 103 mg/dL

## 2017-06-21 LAB — TSH: TSH: 1.63 m[IU]/L

## 2017-06-22 ENCOUNTER — Other Ambulatory Visit: Payer: Self-pay | Admitting: Family Medicine

## 2017-06-22 LAB — INSULIN, FASTING: INSULIN FASTING, SERUM: 12.7 u[IU]/mL (ref 2.0–19.6)

## 2017-06-22 MED ORDER — VITAMIN D (ERGOCALCIFEROL) 1.25 MG (50000 UNIT) PO CAPS: 50000.0000 [IU] | ORAL_CAPSULE | ORAL | 0 refills | Status: DC

## 2017-06-28 ENCOUNTER — Encounter: Payer: Self-pay | Admitting: Family Medicine

## 2017-07-03 ENCOUNTER — Ambulatory Visit (INDEPENDENT_AMBULATORY_CARE_PROVIDER_SITE_OTHER): Payer: BC Managed Care – PPO | Admitting: Family Medicine

## 2017-07-03 ENCOUNTER — Encounter: Payer: Self-pay | Admitting: Family Medicine

## 2017-07-03 VITALS — BP 106/68 | HR 115 | Temp 98.2°F | Resp 18 | Ht 66.0 in | Wt 198.5 lb

## 2017-07-03 DIAGNOSIS — R000 Tachycardia, unspecified: Secondary | ICD-10-CM

## 2017-07-03 DIAGNOSIS — F411 Generalized anxiety disorder: Secondary | ICD-10-CM

## 2017-07-03 DIAGNOSIS — M79604 Pain in right leg: Secondary | ICD-10-CM | POA: Diagnosis not present

## 2017-07-03 DIAGNOSIS — R238 Other skin changes: Secondary | ICD-10-CM | POA: Diagnosis not present

## 2017-07-03 DIAGNOSIS — R062 Wheezing: Secondary | ICD-10-CM | POA: Diagnosis not present

## 2017-07-03 DIAGNOSIS — R233 Spontaneous ecchymoses: Secondary | ICD-10-CM

## 2017-07-03 NOTE — Progress Notes (Addendum)
Name: Teresa Gallagher   MRN: [id number]    DOB: May 09, 1961   Date:07/03/2017       Progress Note  Subjective  Chief Complaint  Chief Complaint  Patient presents with  . Leg Pain    rt leg pain follow up  . Shortness of Breath    spirometry  . Tachycardia    for few weeks  . bruising    pt stated that she has been bruising very easily    HPI  Chronic right leg pain: she was seen 2 weeks ago with worsening symptoms after a long car ride, doppler US, since last visit she has joined the [facility] and is going to water aerobics, pain has resolved  Palpitation: they seem to be unrelated. She states HR can go from 60 to 120 while at rest, no association with SOB or chest pain, but can have some upper extremities weakness with sensation of palpitation. Symptoms resolves with deep breaths. No associated with dizziness. Symptoms started since Feb, when she got her fitbit.  SOB/wheezing: she states that she has SOB with moderate activity, she also has episodes of wheezing when laughing, symptoms started over the past 6 months. Initially she had a dry cough, but that has resolved.   GAD: she has a lot of stress at work, trying to find another job  Obesity: she has joined the gym, she is eating healthier, reviewed labs and hgbA1C and lipid panel has improved. Continue the hard work   Patient Active Problem List   Diagnosis Date Noted  . Episodic tension-type headache, not intractable 12/20/2016  . Rib pain on right side 11/27/2015  . History of blood transfusion 07/30/2015  . GAD (generalized anxiety disorder) 07/30/2015  . Chronic constipation 07/13/2015  . Insomnia 07/13/2015  . Depression, major, recurrent, mild ([place]) 07/13/2015  . Fibromyalgia syndrome 07/13/2015  . 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 Morbid obesity ([place]) 07/13/2015  . Hypersomnia with sleep apnea 07/13/2015  . Vitamin D deficiency 07/13/2015    Past Surgical History:  Procedure Laterality Date  .  BUNIONECTOMY Right 11/07/2014   Dr. Hulda Humphrey at [facility]   . CARPAL TUNNEL RELEASE Left 2012  . CESAREAN SECTION    . LAPAROSCOPIC HYSTERECTOMY     followed by 3 cuff repairs  . LIPOMA EXCISION  2012  . REFRACTIVE SURGERY Bilateral   . TONSILLECTOMY      Family History  Problem Relation Age of Onset  . Heart attack Father     Social History   Social History  . Marital status: Divorced    Spouse name: N/A  . Number of children: N/A  . Years of education: N/A   Occupational History  . Not on file.   Social History Main Topics  . Smoking status: Never Smoker  . Smokeless tobacco: Never Used  . Alcohol use No  . Drug use: No  . Sexual activity: Not Currently   Other Topics Concern  . Not on file   Social History Narrative  . No narrative on file     Current Outpatient Prescriptions:  .  aspirin 81 MG tablet, Take 81 mg by mouth daily., Disp: , Rfl:  .  naproxen (NAPROSYN) 500 MG tablet, Take 1 tablet (500 mg total) by mouth 2 (two) times daily., Disp: 90 tablet, Rfl: 0 .  NUVIGIL 250 MG tablet, Take 1 tablet (250 mg total) by mouth daily. (Patient taking differently: Take 125 mg by mouth daily. ), Disp: 30  tablet, Rfl: 2 .  valACYclovir (VALTREX) 500 MG tablet, Take 1 tablet (500 mg total) by mouth daily. And twice daily for outbreaks, Disp: 35 tablet, Rfl: 12 .  Vitamin D, Ergocalciferol, (DRISDOL) 50000 units CAPS capsule, Take 1 capsule (50,000 Units total) by mouth every 7 (seven) days., Disp: 12 capsule, Rfl: 0  No Known Allergies   ROS  Constitutional: Negative for fever or significant  weight change.  Respiratory: Negative for cough, positive for  shortness of breath.   Cardiovascular: Negative for chest pain , positive for intermittent palpitations.  Gastrointestinal: Negative for abdominal pain, no bowel changes.  Musculoskeletal: Negative for gait problem or joint swelling.  Skin: Negative for rash.  Neurological: Negative for dizziness or  headache.  No other specific complaints in a complete review of systems (except as listed in HPI above).  Objective  Vitals:   07/03/17 1145  BP: 106/68  Pulse: (!) 115  Resp: 18  Temp: 98.2 F (36.8 C)  SpO2: 97%  Weight: 198 lb 8 oz (90 kg)  Height: '5\' 6"'  (1.676 m)    Body mass index is 32.04 kg/m.  Physical Exam  Constitutional: Patient appears well-developed and well-nourished. Obese  No distress.  HEENT: head atraumatic, normocephalic, pupils equal and reactive to light,  neck supple, throat within normal limits Cardiovascular: Normal rate, regular rhythm and normal heart sounds.  No murmur heard. No edema lower extremities Pulmonary/Chest: Effort normal and breath sounds normal. No respiratory distress. Abdominal: Soft.  There is no tenderness. Psychiatric: Patient has a normal mood and affect. behavior is normal. Judgment and thought content normal. Muscular Skeletal: right knee effusion has resolved she has crepitus with extension of left knee Skin: small bruises on both lower legs  Recent Results (from the past 2160 hour(s))  COMPLETE METABOLIC PANEL WITH GFR     Status: Abnormal   Collection Time: 06/20/17 11:22 AM  Result Value Ref Range   Sodium 138 135 - 146 mmol/L   Potassium 4.7 3.5 - 5.3 mmol/L   Chloride 104 98 - 110 mmol/L   CO2 26 20 - 31 mmol/L   Glucose, Bld 93 65 - 99 mg/dL   BUN 10 7 - 25 mg/dL   Creat 0.86 0.50 - 1.05 mg/dL    Comment:   For patients > or = 57 years of age: The upper reference limit for Creatinine is approximately 13% higher for people identified as African-American.      Total Bilirubin 0.5 0.2 - 1.2 mg/dL   Alkaline Phosphatase 66 33 - 130 U/L   AST 46 (H) 10 - 35 U/L   ALT 26 6 - 29 U/L   Total Protein 6.2 6.1 - 8.1 g/dL   Albumin 3.9 3.6 - 5.1 g/dL   Calcium 9.0 8.6 - 10.4 mg/dL   GFR, Est African American 87 >=60 mL/min   GFR, Est Non African American 76 >=60 mL/min  CBC with Differential/Platelet     Status: None    Collection Time: 06/20/17 11:22 AM  Result Value Ref Range   WBC 6.7 3.8 - 10.8 K/uL   RBC 4.40 3.80 - 5.10 MIL/uL   Hemoglobin 12.9 11.7 - 15.5 g/dL   HCT 38.2 35.0 - 45.0 %   MCV 86.8 80.0 - 100.0 fL   MCH 29.3 27.0 - 33.0 pg   MCHC 33.8 32.0 - 36.0 g/dL   RDW 14.8 11.0 - 15.0 %   Platelets 230 140 - 400 K/uL   MPV 10.8 7.5 -  12.5 fL   Neutro Abs 4,087 1,500 - 7,800 cells/uL   Lymphs Abs 2,144 850 - 3,900 cells/uL   Monocytes Absolute 335 200 - 950 cells/uL   Eosinophils Absolute 134 15 - 500 cells/uL   Basophils Absolute 0 0 - 200 cells/uL   Neutrophils Relative % 61 %   Lymphocytes Relative 32 %   Monocytes Relative 5 %   Eosinophils Relative 2 %   Basophils Relative 0 %   Smear Review Criteria for review not met   Lipid panel     Status: Abnormal   Collection Time: 06/20/17 11:22 AM  Result Value Ref Range   Cholesterol 182 <200 mg/dL   Triglycerides 87 <150 mg/dL   HDL 59 >50 mg/dL   Total CHOL/HDL Ratio 3.1 <5.0 Ratio   VLDL 17 <30 mg/dL   LDL Cholesterol 106 (H) <100 mg/dL  TSH     Status: None   Collection Time: 06/20/17 11:22 AM  Result Value Ref Range   TSH 1.63 mIU/L    Comment:   Reference Range   > or = 20 Years  0.40-4.50   Pregnancy Range First trimester  0.26-2.66 Second trimester 0.55-2.73 Third trimester  0.43-2.91     Hemoglobin A1c     Status: None   Collection Time: 06/20/17 11:22 AM  Result Value Ref Range   Hgb A1c MFr Bld 5.2 <5.7 %    Comment:   For the purpose of screening for the presence of diabetes:   <5.7%       Consistent with the absence of diabetes 5.7-6.4 %   Consistent with increased risk for diabetes (prediabetes) >=6.5 %     Consistent with diabetes   This assay result is consistent with a decreased risk of diabetes.   Currently, no consensus exists regarding use of hemoglobin A1c for diagnosis of diabetes in children.   According to American Diabetes Association (ADA) guidelines, hemoglobin A1c <7.0% represents  optimal control in non-pregnant diabetic patients. Different metrics may apply to specific patient populations. Standards of Medical Care in Diabetes (ADA).      Mean Plasma Glucose 103 mg/dL  Insulin, fasting     Status: None   Collection Time: 06/20/17 11:22 AM  Result Value Ref Range   Insulin fasting, serum 12.7 2.0 - 19.6 uIU/mL    Comment:   This insulin assay shows strong cross-reactivity for some insulin analogs (lispro, aspart, and glargine) and much lower cross-reactivity with others (detemir, glulisine).   Stimulated Insulin reference intervals were established using the Siemens Immulite assay. These values are provided for general guidance only.   VITAMIN D 25 Hydroxy (Vit-D Deficiency, Fractures)     Status: Abnormal   Collection Time: 06/20/17 11:22 AM  Result Value Ref Range   Vit D, 25-Hydroxy 17 (L) 30 - 100 ng/mL    Comment: Vitamin D Status           25-OH Vitamin D        Deficiency                <20 ng/mL        Insufficiency         20 - 29 ng/mL        Optimal             > or = 30 ng/mL   For 25-OH Vitamin D testing on patients on D2-supplementation and patients for whom quantitation of D2 and D3 fractions is required, the QuestAssureD 25-OH VIT  D, (D2,D3), LC/MS/MS is recommended: order code [id number] (patients > 2 yrs).       PHQ2/9: Depression screen [facility] 2/9 06/19/2017 12/20/2016 11/05/2015 07/30/2015  Decreased Interest 0 0 0 3  Down, Depressed, Hopeless 0 0 1 3  PHQ - 2 Score 0 0 1 6  Altered sleeping - - - 3  Tired, decreased energy - - - 3  Change in appetite - - - 3  Feeling bad or failure about yourself  - - - 0  Trouble concentrating - - - 3  Moving slowly or fidgety/restless - - - 3  Suicidal thoughts - - - 0  PHQ-9 Score - - - 21  Difficult doing work/chores - - - Very difficult     Fall Risk: Fall Risk  06/19/2017 12/20/2016 11/05/2015 07/30/2015  Falls in the past year? No No No No     Assessment & Plan  1. Tachycardia  -  Ambulatory referral to Cardiology  2. Easy bruising  On legs only, last CBC normal, reassurance for now  3. GAD (generalized anxiety disorder)  She has some symptoms of anxiety  4. Morbid obesity ([place])  Discussed with the patient the risk posed by an increased BMI. Discussed importance of portion control, calorie counting and at least 150 minutes of physical activity weekly. Avoid sweet beverages and drink more water. Eat at least 6 servings of fruit and vegetables daily   5. Wheezing  - Ambulatory referral to Pulmonology - spirometry normal FeV1/FVC - low FEV1 at 70% possible restrictive disease - it may be secondary to obesity   6. Right leg pain  Doing better

## 2017-07-03 NOTE — Addendum Note (Signed)
Addended by: Inda Coke on: 07/03/2017 12:16 PM   Modules accepted: Orders

## 2017-07-17 ENCOUNTER — Other Ambulatory Visit: Payer: Self-pay | Admitting: Family Medicine

## 2017-07-17 DIAGNOSIS — G4710 Hypersomnia, unspecified: Secondary | ICD-10-CM

## 2017-07-17 DIAGNOSIS — G4730 Sleep apnea, unspecified: Principal | ICD-10-CM

## 2017-07-17 NOTE — Telephone Encounter (Signed)
Patient requesting ref[facility] of [facility].  

## 2017-08-02 ENCOUNTER — Ambulatory Visit (HOSPITAL_COMMUNITY)
Admission: RE | Admit: 2017-08-02 | Discharge: 2017-08-02 | Disposition: A | Discharge status: Home / Self Care | Payer: BC Managed Care – PPO | Source: Ambulatory Visit | Attending: Internal Medicine | Admitting: Internal Medicine

## 2017-08-02 VITALS — BP 102/58 | HR 85 | Wt 202.8 lb

## 2017-08-02 DIAGNOSIS — E559 Vitamin D deficiency, unspecified: Secondary | ICD-10-CM | POA: Insufficient documentation

## 2017-08-02 DIAGNOSIS — Z7982 Long term (current) use of aspirin: Secondary | ICD-10-CM | POA: Insufficient documentation

## 2017-08-02 DIAGNOSIS — R918 Other nonspecific abnormal finding of lung field: Secondary | ICD-10-CM | POA: Insufficient documentation

## 2017-08-02 DIAGNOSIS — R0600 Dyspnea, unspecified: Secondary | ICD-10-CM

## 2017-08-02 DIAGNOSIS — Z8249 Family history of ischemic heart disease and other diseases of the circulatory system: Secondary | ICD-10-CM | POA: Diagnosis not present

## 2017-08-02 DIAGNOSIS — R0609 Other forms of dyspnea: Secondary | ICD-10-CM | POA: Insufficient documentation

## 2017-08-02 DIAGNOSIS — Z79899 Other long term (current) drug therapy: Secondary | ICD-10-CM | POA: Diagnosis not present

## 2017-08-02 MED ORDER — FUROSEMIDE 20 MG PO TABS: 20.0000 mg | ORAL_TABLET | ORAL | 2 refills | Status: DC | PRN

## 2017-08-02 MED ORDER — POTASSIUM CHLORIDE ER 20 MEQ PO TBCR: 20.0000 meq | EXTENDED_RELEASE_TABLET | ORAL | 2 refills | Status: DC | PRN

## 2017-08-02 NOTE — Progress Notes (Signed)
ADVANCED HF CLINIC CONSULT NOTE    HPI:  Teresa Gallagher is a 56 y/o woman (sister of Teresa Gallagher) who presents for new patient evauation for dyspnea.  For almost a year has noticed progressive DOE. Now dyspneic with ADLs. In June drove to [place] (took breaks along the way). During that trip developed leg swelling. Went to the hospital there and got LE u/s no clots. Did not do echo. Edema improved subsequently.   Denies HTN or known CAD. Non-smoker. Denies h/o asthma. No CP or pressure associated with it. Has gained about 10 pounds in last 2 months. Has been doing water aerobics 2x/week and going to exercise camp 2x/week and doing with that. No orthopnea or PND. No wheezing. No significant coughing.  Labs 06/20/17: LDL 106 HDL 59 Cr 0.86  Review of Systems: [y] = yes, [ ]  = no   General: Weight gain Blue.Reese ]; Weight loss [ ] ; Anorexia [ ] ; Fatigue [ ] ; Fever [ ] ; Chills [ ] ; Weakness [ ]   Cardiac: Chest pain/pressure [ ] ; Resting SOB [ ] ; Exertional SOB [ y]; Orthopnea [ ] ; Pedal Edema [ ] ; Palpitations [ ] ; Syncope [ ] ; Presyncope [ ] ; Paroxysmal nocturnal dyspnea[ ]   Pulmonary: Cough [ ] ; Wheezing[ ] ; Hemoptysis[ ] ; Sputum [ ] ; Snoring [ ]   GI: Vomiting[ ] ; Dysphagia[ ] ; Melena[ ] ; Hematochezia [ ] ; Heartburn[ ] ; Abdominal pain [ ] ; Constipation [ ] ; Diarrhea [ ] ; BRBPR [ ]   GU: Hematuria[ ] ; Dysuria [ ] ; Nocturia[ ]   Vascular: Pain in legs with walking [ ] ; Pain in feet with lying flat [ ] ; Non-healing sores [ ] ; Stroke [ ] ; TIA [ ] ; Slurred speech [ ] ;  Neuro: Headaches[ ] ; Vertigo[ ] ; Seizures[ ] ; Paresthesias[ ] ;Blurred vision [ ] ; Diplopia [ ] ; Vision changes [ ]   Ortho/Skin: Arthritis Blue.Reese ]; Joint pain [ ] ; Muscle pain [ ] ; Joint swelling [ ] ; Back Pain [ ] ; Rash [ ]   Psych: Depression[ ] ; Anxiety[ ]   Heme: Bleeding problems [ ] ; Clotting disorders [ ] ; Anemia [ ]   Endocrine: Diabetes [ ] ; Thyroid dysfunction[ ]    Past Medical History:  Diagnosis Date  . Acne   . Arrhythmia   .  Blood transfusion without reported diagnosis   . Bunion   . Cervicalgia   . Depression   . Herpes simplex without mention of complication   . Mastodynia   . Myalgia and myositis, unspecified   . Other acne   . Other malaise and fatigue   . Painful respiration   . Palpitations   . Seborrhea capitis   . Symptomatic menopausal or female climacteric states   . Unspecified constipation   . Unspecified sleep apnea   . Unspecified vitamin D deficiency   . Vitamin D deficiency     Current Outpatient Prescriptions  Medication Sig Dispense Refill  . Armodafinil 250 MG tablet TAKE 1 TABLET BY MOUTH EVERY DAY 30 tablet 0  . aspirin 81 MG tablet Take 81 mg by mouth daily.    . naproxen (NAPROSYN) 500 MG tablet Take 1 tablet (500 mg total) by mouth 2 (two) times daily. 90 tablet 0  . valACYclovir (VALTREX) 500 MG tablet Take 1 tablet (500 mg total) by mouth daily. And twice daily for outbreaks 35 tablet 12  . Vitamin D, Ergocalciferol, (DRISDOL) 50000 units CAPS capsule Take 1 capsule (50,000 Units total) by mouth every 7 (seven) days. 12 capsule 0   No current facility-administered medications for this encounter.  No Known Allergies    Social History   Social History  . Marital status: Divorced    Spouse name: N/A  . Number of children: N/A  . Years of education: N/A   Occupational History  . Not on file.   Social History Main Topics  . Smoking status: Never Smoker  . Smokeless tobacco: Never Used  . Alcohol use No  . Drug use: No  . Sexual activity: Not Currently   Other Topics Concern  . Not on file   Social History Narrative  . No narrative on file      Family History  Problem Relation Age of Onset  . Heart attack Father     Vitals:   08/02/17 1425  BP: (!) 102/58  Pulse: 85  SpO2: 98%  Weight: 202 lb 12 oz (92 kg)    PHYSICAL EXAM: General:  Well appearing. No respiratory difficulty HEENT: normal Neck: supple. no JVD. Carotids 2+ bilat; no bruits.  No lymphadenopathy or thryomegaly appreciated. Cor: PMI nondisplaced. Regular rate & rhythm. No rubs, gallops or murmurs. Lungs: clear Abdomen: obese soft, nontender, nondistended. No hepatosplenomegaly. No bruits or masses. Good bowel sounds. Extremities: no cyanosis, clubbing, rash, 1+ edema Neuro: alert & oriented x 3, cranial nerves grossly intact. moves all 4 extremities w/o difficulty. Affect pleasant.  ECG: NSR No ST-T wave abnormalities.   ASSESSMENT & PLAN: 1. Dyspnea on exertion - unclear etiology. Will start with CXR and echo.  - does seem to have mild diastolic HF with some edema on exam. Will use lasix 20mg /kcl 20 on prn basis only. - plan CPX testing to further evaluate - discussed role that obesity may be playing in symptoms as well  Teresa Gallagher, Teresa Quince, MD  11:05 PM

## 2017-08-02 NOTE — Patient Instructions (Signed)
Echocardiogram and cardiopulmonary test has been ordered for you, we will schedule at checkout.   Chest xray today  Take lasix 20 mg as needed for swelling or weight gain of 3 lbs overnight or 5 lbs in a week.  Take potassium 20 mEq (1 Tablet) anytime you to take lasix.  Follow up in 6-8 weeks.

## 2017-08-07 ENCOUNTER — Telehealth (HOSPITAL_COMMUNITY): Payer: Self-pay | Admitting: *Deleted

## 2017-08-07 NOTE — Telephone Encounter (Signed)
Patient called asking if it was necessary for her to keep her pulmonologist appointment that she has scheduled tomorrow since she is also scheduled for a cardiopulmonary test.  I advised her to keep both appointment. No further questions.

## 2017-08-08 ENCOUNTER — Ambulatory Visit (INDEPENDENT_AMBULATORY_CARE_PROVIDER_SITE_OTHER): Payer: BC Managed Care – PPO | Admitting: Internal Medicine

## 2017-08-08 ENCOUNTER — Encounter: Payer: Self-pay | Admitting: Internal Medicine

## 2017-08-08 VITALS — BP 136/88 | HR 85 | Ht 66.0 in | Wt 200.0 lb

## 2017-08-08 DIAGNOSIS — J4540 Moderate persistent asthma, uncomplicated: Secondary | ICD-10-CM | POA: Diagnosis not present

## 2017-08-08 NOTE — Patient Instructions (Addendum)
Start symbicort inhaler 2 puffs twice daily, rinse mouth after use. Call back for prescription if it is helping.   Increase your activity level, exercise daily for 30 minutes.

## 2017-08-08 NOTE — Progress Notes (Signed)
[facility] Consultation      Assessment and Plan:  The patient is a 56 year old female who presents with chronic slowly progressive exertional dyspnea.  Asthma/Chronic bronchitis.  -Appears mild, given sample of Symbicort 80 asked to use 2 puffs twice daily, rinse mouth after each use. -She is asked to call us back after completing the sample, we can give her a prescription. If it helps. -The meantime, she is being referred for a full pulmonary function test.  Dyspnea on exertion.  -Discussed that this could be secondary to asthma versus mild early deconditioning. She notes that she has gained some weight over the last year. -I've asked her to increase her physical activity level, try to get at least 30 minutes of aerobic activity and per day, as this may help with her breathing.  Date: 08/08/2017  MRN# [id number] Teresa Gallagher February 16, 1961   Teresa Gallagher is a 56 y.o. old female seen in consultation for chief complaint of:    Chief Complaint  Patient presents with  . Wheezing    ref by Ancil Boozer:   Marland Kitchen Shortness of Breath    w/activity; cough    HPI:   She has noticed that she has been getting dyspnea over the past 6 months to a year. It has been progressive over the past year, with coughing. She has been noted to have leg swelling with trips in the past and has been started on lasix prn, she thinks that her breathing is a bit better than before since starting the lasix. She has never been a smoker, she has a dog, not in bedroom.  She has seasonal allergies. She has been on no inhalers. Her father smoked.  She has OSA and is on PAP, she uses it most nights.   I personally reviewed. Chest x-ray 08/02/2017; mild hyperinflation, increase markings which may be consistent with chronic bronchitis.  Review of the outpatient spirometry 07/03/2017; FVC was 69% of predicted, FEV1 was 70% of predicted, ratio was 80%. Overall test is indicative of mild restrictive lung  disease  CBC 06/20/17; Eos=200.    PMHX:   Past Medical History:  Diagnosis Date  . Acne   . Arrhythmia   . Blood transfusion without reported diagnosis   . Bunion   . Cervicalgia   . Depression   . Herpes simplex without mention of complication   . Mastodynia   . Myalgia and myositis, unspecified   . Other acne   . Other malaise and fatigue   . Painful respiration   . Palpitations   . Seborrhea capitis   . Symptomatic menopausal or female climacteric states   . Unspecified constipation   . Unspecified sleep apnea   . Unspecified vitamin D deficiency   . Vitamin D deficiency    Surgical Hx:  Past Surgical History:  Procedure Laterality Date  . BUNIONECTOMY Right 11/07/2014   Dr. Hulda Humphrey at [facility]   . CARPAL TUNNEL RELEASE Left 2012  . CESAREAN SECTION    . LAPAROSCOPIC HYSTERECTOMY     followed by 3 cuff repairs  . LIPOMA EXCISION  2012  . REFRACTIVE SURGERY Bilateral   . TONSILLECTOMY     Family Hx:  Family History  Problem Relation Age of Onset  . Heart attack Father    Social Hx:   Social History  Substance Use Topics  . Smoking status: Never Smoker  . Smokeless tobacco: Never Used  . Alcohol use No   Medication:  Current Outpatient Prescriptions:  .  Armodafinil 250 MG tablet, TAKE 1 TABLET BY MOUTH EVERY DAY, Disp: 30 tablet, Rfl: 0 .  aspirin 81 MG tablet, Take 81 mg by mouth daily., Disp: , Rfl:  .  furosemide (LASIX) 20 MG tablet, Take 1 tablet (20 mg total) by mouth as needed for fluid or edema., Disp: 30 tablet, Rfl: 2 .  naproxen (NAPROSYN) 500 MG tablet, Take 1 tablet (500 mg total) by mouth 2 (two) times daily., Disp: 90 tablet, Rfl: 0 .  potassium chloride 20 MEQ TBCR, Take 20 mEq by mouth as needed. Take 1 tablet with lasix., Disp: 30 tablet, Rfl: 2 .  valACYclovir (VALTREX) 500 MG tablet, Take 1 tablet (500 mg total) by mouth daily. And twice daily for outbreaks, Disp: 35 tablet, Rfl: 12 .  Vitamin D, Ergocalciferol, (DRISDOL)  50000 units CAPS capsule, Take 1 capsule (50,000 Units total) by mouth every 7 (seven) days., Disp: 12 capsule, Rfl: 0   Allergies:  Patient has no known allergies.  Review of Systems: Gen:  Denies  fever, sweats, chills HEENT: Denies blurred vision, double vision. bleeds, sore throat Cvc:  No dizziness, chest pain. Resp:   Denies cough or sputum production, shortness of breath Gi: Denies swallowing difficulty, stomach pain. Gu:  Denies bladder incontinence, burning urine Ext:   No Joint pain, stiffness. Skin: No skin rash,  hives  Endoc:  No polyuria, polydipsia. Psych: No depression, insomnia. Other:  All other systems were reviewed with the patient and were negative other that what is mentioned in the HPI.   Physical Examination:   VS: BP 136/88 (BP Location: Left Arm, Cuff Size: Normal)   Pulse 85   Ht 5\' 6"  (1.676 m)   Wt 200 lb (90.7 kg)   SpO2 97%   BMI 32.28 kg/m   General Appearance: No distress  Neuro:without focal findings,  speech normal,  HEENT: PERRLA, EOM intact.   Pulmonary: normal breath sounds, No wheezing.  CardiovascularNormal S1,S2.  No m/r/g.   Abdomen: Benign, Soft, non-tender. Renal:  No costovertebral tenderness  GU:  No performed at this time. Endoc: No evident thyromegaly, no signs of acromegaly. Skin:   warm, no rashes, no ecchymosis  Extremities: normal, no cyanosis, clubbing.  Other findings:    LABORATORY PANEL:   CBC No results for input(s): WBC, HGB, HCT, PLT in the last 168 hours. ------------------------------------------------------------------------------------------------------------------  Chemistries  No results for input(s): NA, K, CL, CO2, GLUCOSE, BUN, CREATININE, CALCIUM, MG, AST, ALT, ALKPHOS, BILITOT in the last 168 hours.  Invalid input(s): GFRCGP ------------------------------------------------------------------------------------------------------------------  Cardiac Enzymes No results for input(s): TROPONINI in  the last 168 hours. ------------------------------------------------------------  RADIOLOGY:  No results found.     Thank  you for the consultation and for allowing [facility] to assist in the care of your patient. Our recommendations are noted above.  Please contact us if we can be of further service.   Marda Stalker, MD.  Board Certified in Internal Medicine, Pulmonary Medicine, [place], and Sleep Medicine.  [facility] Office Number: [phone]  Patricia Pesa, M.D.  Merton Border, M.D  08/08/2017

## 2017-08-14 ENCOUNTER — Ambulatory Visit (HOSPITAL_COMMUNITY)
Admission: RE | Admit: 2017-08-14 | Discharge: 2017-08-14 | Disposition: A | Discharge status: Home / Self Care | Payer: BC Managed Care – PPO | Source: Ambulatory Visit | Attending: Family Medicine | Admitting: Family Medicine

## 2017-08-14 DIAGNOSIS — E669 Obesity, unspecified: Secondary | ICD-10-CM | POA: Insufficient documentation

## 2017-08-14 DIAGNOSIS — R0600 Dyspnea, unspecified: Secondary | ICD-10-CM

## 2017-08-14 DIAGNOSIS — I071 Rheumatic tricuspid insufficiency: Secondary | ICD-10-CM | POA: Insufficient documentation

## 2017-08-14 NOTE — Progress Notes (Signed)
  Echocardiogram 2D Echocardiogram has been performed.  Teresa Gallagher 08/14/2017, 3:45 PM

## 2017-08-15 ENCOUNTER — Ambulatory Visit (HOSPITAL_COMMUNITY): Payer: BC Managed Care – PPO | Attending: Internal Medicine

## 2017-08-15 DIAGNOSIS — R0600 Dyspnea, unspecified: Secondary | ICD-10-CM | POA: Diagnosis present

## 2017-08-16 DIAGNOSIS — R0600 Dyspnea, unspecified: Secondary | ICD-10-CM | POA: Diagnosis not present

## 2017-08-29 DIAGNOSIS — R0602 Shortness of breath: Secondary | ICD-10-CM | POA: Insufficient documentation

## 2017-08-29 NOTE — Progress Notes (Addendum)
Cardiology Office Note  Date:  08/30/2017   ID:  Teresa Gallagher, DOB [birth year], MRN [id number]  PCP:  Steele Sizer, MD   Chief Complaint  Patient presents with  . other    Tachycardia c/o sob and edema ankles/legs. Meds reviewed verbally with pt.    HPI:  Ms Teresa Gallagher is a 56 y/o woman with a history of progressive DOE dyspneic with ADLs Echocardiogram ejection fraction 55-60%, started on Lasix for diastolic CHF After being seen by CHF clinic Negative Exercise stress test, limited due to her body habitus Seen by pulmonary, started on Symbicort, weight loss recommended Who presents by referral from Dr. Ancil Boozer for consultation of her tachycardia and shortness of breath  Previous notes reviewed, In June drove to [place] (took breaks along the way). During that trip developed leg swelling. Went to the hospital and got LE u/s,  no clots. Edema improved subsequently.   Previously very active, 2 years ago doing lots of physical activity and exercise Had high exercise endurance Since then has been sedentary, working more, traveling more Less exercise Now short of breath with low-grade exercise Recently restarted water aerobics 2 times per week   She is concerned about elevated heart rate with minimal exertion Heart rate sometimes will be elevated even at rest   Significant weight gain over the past year  Concerned about swelling around her neck around her clavicles bilaterally  Denies any PND, orthopnea  Tried Lasix on and off, typically takes 10 mg as needed, nothing on a regular basis  Labs reviewed with her in detail Labs 06/20/17: LDL 106 HDL 59 Cr 0.86:  Hemoglobin A1c 5.2  Shows normal sinus rhythm with rate 74 bpm no significant ST or T-wave changesEKG personally reviewed by myself on todays visit   In terms of family history Mom died 5 child birth several males in the family with underlying coronary disease   PMH:   has a past medical history of Acne;  Arrhythmia; Blood transfusion without reported diagnosis; Bunion; Cervicalgia; Chronic kidney disease; Depression; Herpes simplex without mention of complication; Mastodynia; Myalgia and myositis, unspecified; Other acne; Other malaise and fatigue; Painful respiration; Palpitations; Seborrhea capitis; Symptomatic menopausal or female climacteric states; Unspecified constipation; Unspecified sleep apnea; Unspecified vitamin D deficiency; and Vitamin D deficiency.  PSH:    Past Surgical History:  Procedure Laterality Date  . BUNIONECTOMY Right 11/07/2014   Dr. Hulda Humphrey at [facility]   . CARPAL TUNNEL RELEASE Left 2012  . CESAREAN SECTION    . LAPAROSCOPIC HYSTERECTOMY     followed by 3 cuff repairs  . LIPOMA EXCISION  2012  . REFRACTIVE SURGERY Bilateral   . TONSILLECTOMY      Current Outpatient Prescriptions  Medication Sig Dispense Refill  . Armodafinil 250 MG tablet TAKE 1 TABLET BY MOUTH EVERY DAY 30 tablet 0  . aspirin 81 MG tablet Take 81 mg by mouth daily.    . furosemide (LASIX) 20 MG tablet Take 1 tablet (20 mg total) by mouth as needed for fluid or edema. 30 tablet 6  . naproxen (NAPROSYN) 500 MG tablet Take 1 tablet (500 mg total) by mouth 2 (two) times daily. 90 tablet 0  . Potassium Chloride ER 20 MEQ TBCR Take 20 mEq by mouth as needed. Take 1 tablet with lasix. 30 tablet 6  . valACYclovir (VALTREX) 500 MG tablet Take 1 tablet (500 mg total) by mouth daily. And twice daily for outbreaks 35 tablet 12  . Vitamin D, Ergocalciferol, (DRISDOL)  50000 units CAPS capsule Take 1 capsule (50,000 Units total) by mouth every 7 (seven) days. 12 capsule 0   No current facility-administered medications for this visit.      Allergies:   Patient has no known allergies.   Social History:  The patient  reports that she has never smoked. She has never used smokeless tobacco. She reports that she drinks alcohol. She reports that she does not use drugs.   Family History:   family history  includes Heart attack in her father and paternal uncle.    Review of Systems: Review of Systems  Constitutional: Negative.   Respiratory: Negative.   Cardiovascular: Negative.   Gastrointestinal: Negative.   Musculoskeletal: Negative.   Neurological: Negative.   Psychiatric/Behavioral: Negative.   All other systems reviewed and are negative.    PHYSICAL EXAM: VS:  BP 108/77 (BP Location: Right Arm, Patient Position: Sitting, Cuff Size: Large)   Pulse 74   Ht 5' 5.5" (1.664 m)   Wt 201 lb (91.2 kg)   BMI 32.94 kg/m  , BMI Body mass index is 32.94 kg/m. GEN: Well nourished, well developed, in no acute distress ,  obese  HEENT: normal  Neck: no JVD, carotid bruits, or masses Cardiac: RRR; no murmurs, rubs, or gallops,no edema  Respiratory:  clear to auscultation bilaterally, normal work of breathing GI: soft, nontender, nondistended, + BS MS: no deformity or atrophy  Skin: warm and dry, no rash Neuro:  Strength and sensation are intact Psych: euthymic mood, full affect    Recent Labs: 06/20/2017: ALT 26; BUN 10; Creat 0.86; Hemoglobin 12.9; Platelets 230; Potassium 4.7; Sodium 138; TSH 1.63    Lipid Panel Lab Results  Component Value Date   CHOL 182 06/20/2017   HDL 59 06/20/2017   LDLCALC 106 (H) 06/20/2017   TRIG 87 06/20/2017      Wt Readings from Last 3 Encounters:  08/30/17 201 lb (91.2 kg)  08/08/17 200 lb (90.7 kg)  08/02/17 202 lb 12 oz (92 kg)       ASSESSMENT AND PLAN:  Morbid obesity ([place]) We have encouraged continued exercise, careful diet management in an effort to lose weight.  SOB (shortness of breath) - Plan: EKG 12-Lead Suspect given previous workup reviewed including echocardiogram and ventilatory exercise stress tests and after workup by pulmonary, that much of her symptoms are secondary to deconditioning and weight. Recommended regular exercise program -We did discuss other testing to rule out ischemia . She is low risk of ischemia  given minimal risk factors . Likely no further workup needed but she is interested in CT coronary calcium scoring . This will be arranged for her   Tachycardia Rate well controlled, appropriate rate response to exercise Recommended we work on her conditioning  Depression, major, recurrent, mild ([place])  managed by primary care  GAD (generalized anxiety disorder)  tried to reassure her with symptoms of shortness of breath above  Disposition:   F/U  as needed    Total encounter time more than 60 minutes  Greater than 50% was spent in counseling and coordination of care with the patient   Patient was seen in consultation for Dr.  Ancil Boozer and will be referred back to her office from going care of the issues detailed above   Orders Placed This Encounter  Procedures  . CT CARDIAC SCORING  . EKG 12-Lead     Signed, Esmond Plants, M.D., Ph.D. 08/30/2017  [place], [place]

## 2017-08-30 ENCOUNTER — Ambulatory Visit (INDEPENDENT_AMBULATORY_CARE_PROVIDER_SITE_OTHER): Payer: BC Managed Care – PPO | Admitting: Cardiovascular Disease

## 2017-08-30 ENCOUNTER — Encounter: Payer: Self-pay | Admitting: Cardiovascular Disease

## 2017-08-30 DIAGNOSIS — R0602 Shortness of breath: Secondary | ICD-10-CM

## 2017-08-30 DIAGNOSIS — F411 Generalized anxiety disorder: Secondary | ICD-10-CM

## 2017-08-30 DIAGNOSIS — F330 Major depressive disorder, recurrent, mild: Secondary | ICD-10-CM | POA: Diagnosis not present

## 2017-08-30 MED ORDER — VITAMIN D (ERGOCALCIFEROL) 1.25 MG (50000 UNIT) PO CAPS: 50000.0000 [IU] | ORAL_CAPSULE | ORAL | 0 refills | Status: DC

## 2017-08-30 MED ORDER — POTASSIUM CHLORIDE ER 20 MEQ PO TBCR: 20.0000 meq | EXTENDED_RELEASE_TABLET | ORAL | 6 refills | Status: DC | PRN

## 2017-08-30 MED ORDER — FUROSEMIDE 20 MG PO TABS: 20.0000 mg | ORAL_TABLET | ORAL | 6 refills | Status: DC | PRN

## 2017-08-30 NOTE — Patient Instructions (Signed)
Medication Instructions:   No medication changes made  Labwork:  No new labs needed  Testing/Procedures:  We will order CT coronary calcium score for shortness of breath   Follow-Up: It was a pleasure seeing you in the office today. Please call us if you have new issues that need to be addressed before your next appt.  [phone]  Your physician wants you to follow-up in:  As needed  If you need a refill on your cardiac medications before your next appointment, please call your pharmacy.

## 2017-08-31 ENCOUNTER — Other Ambulatory Visit: Payer: Self-pay | Admitting: Family Medicine

## 2017-08-31 DIAGNOSIS — G4710 Hypersomnia, unspecified: Secondary | ICD-10-CM

## 2017-08-31 DIAGNOSIS — G4730 Sleep apnea, unspecified: Principal | ICD-10-CM

## 2017-08-31 NOTE — Telephone Encounter (Signed)
Patient requesting refill of Armodafinil to [facility].

## 2017-09-25 ENCOUNTER — Encounter (HOSPITAL_COMMUNITY): Payer: Self-pay | Admitting: Internal Medicine

## 2017-09-25 ENCOUNTER — Ambulatory Visit (HOSPITAL_COMMUNITY)
Admission: RE | Admit: 2017-09-25 | Discharge: 2017-09-25 | Disposition: A | Discharge status: Home / Self Care | Payer: BC Managed Care – PPO | Source: Ambulatory Visit | Attending: Internal Medicine | Admitting: Internal Medicine

## 2017-09-25 VITALS — BP 122/88 | HR 87 | Wt 201.8 lb

## 2017-09-25 DIAGNOSIS — E559 Vitamin D deficiency, unspecified: Secondary | ICD-10-CM | POA: Insufficient documentation

## 2017-09-25 DIAGNOSIS — F329 Major depressive disorder, single episode, unspecified: Secondary | ICD-10-CM | POA: Diagnosis not present

## 2017-09-25 DIAGNOSIS — R0600 Dyspnea, unspecified: Secondary | ICD-10-CM | POA: Diagnosis present

## 2017-09-25 DIAGNOSIS — M791 Myalgia, unspecified site: Secondary | ICD-10-CM | POA: Diagnosis not present

## 2017-09-25 DIAGNOSIS — Z7982 Long term (current) use of aspirin: Secondary | ICD-10-CM | POA: Diagnosis not present

## 2017-09-25 DIAGNOSIS — R071 Chest pain on breathing: Secondary | ICD-10-CM | POA: Insufficient documentation

## 2017-09-25 DIAGNOSIS — R0609 Other forms of dyspnea: Secondary | ICD-10-CM | POA: Insufficient documentation

## 2017-09-25 DIAGNOSIS — N644 Mastodynia: Secondary | ICD-10-CM | POA: Insufficient documentation

## 2017-09-25 DIAGNOSIS — N189 Chronic kidney disease, unspecified: Secondary | ICD-10-CM | POA: Insufficient documentation

## 2017-09-25 DIAGNOSIS — R0602 Shortness of breath: Secondary | ICD-10-CM

## 2017-09-25 DIAGNOSIS — M609 Myositis, unspecified: Secondary | ICD-10-CM | POA: Diagnosis not present

## 2017-09-25 LAB — BASIC METABOLIC PANEL
ANION GAP: 8 (ref 5–15)
BUN: 14 mg/dL (ref 6–20)
CALCIUM: 9.1 mg/dL (ref 8.9–10.3)
CHLORIDE: 100 mmol/L — AB (ref 101–111)
CO2: 27 mmol/L (ref 22–32)
Creatinine, Ser: 0.94 mg/dL (ref 0.44–1.00)
GFR calc non Af Amer: >60 mL/min (ref >60)
Glucose, Bld: 90 mg/dL (ref 65–99)
Potassium: 4.3 mmol/L (ref 3.5–5.1)
SODIUM: 135 mmol/L (ref 135–145)

## 2017-09-25 NOTE — Progress Notes (Signed)
ADVANCED HF CLINIC NOTE    HPI:  Teresa Gallagher is a 56 y/o woman (sister of [name]) who returns for f/u on her dyspnea  For almost a year has noticed progressive DOE. We saw her for the first time in August 2018. Underwent echo and CPX (see below).   Denies HTN or known CAD. Non-smoker. Denies h/o asthma.   She presents today for follow up. At last visit we started lasix 20 on prn basis. Takes it 2-3x/week when she is swelling. Feels remarkably better the next day. Mild edema. No orthopnea or PND. Still gets SOB  With going up steps or other strenuous activity. No SOB with routine walking. 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 Now having problems sleeping.   Labs 06/20/17: LDL 106 HDL 59 Cr 0.86  Echo 08/14/17 LVEF 55-60%, Grade 1 DD  CPX 08/16/17 Pre-Exercise PFTs  FVC 2.46 (82%)    FEV1 1.95 (81%)     FEV1/FVC 79 (99%)     MVV 95 (98%)  Post-Exercise PFTs ((from lowest post-exercise trial (%change from rest)) FVC performed IPE, 5, 10 mins FVC 2.33 (-5%) IPE    FEV1 1.88 (-3%)  IPE     FEV1/FVC 77 (-2%) 10 mins     Exercise Time:  14:45  Speed (mph): 3.0    Grade (%): 15.0   RPE: 17 Reason stopped: dyspnea (8/10)  Additional symptoms: None reported Resting HR: 94 Peak HR: 164  (100% age predicted max HR) BP rest: 106/82 BP peak: 174/96 Peak VO2: 22.6 (116% predicted peak VO2) VE/VCO2 slope: 28 OUES: 2.31 Peak RER: 1.03 Ventilatory Threshold: 16.2 (83% predicted and 72% measured peak VO2) Peak RR 50 Peak Ventilation: 64.3 VE/MVV: 68% PETCO2 at peak: 39 O2pulse: 13  (118% predicted O2pulse)  Review of systems complete and found to be negative unless listed in HPI.    Past Medical History:  Diagnosis Date  . Acne   . Arrhythmia   . Blood transfusion without reported diagnosis   . Bunion   . Cervicalgia   . Chronic kidney disease    Kidney Stents  . Depression   . Herpes simplex without  mention of complication   . Mastodynia   . Myalgia and myositis, unspecified   . Other acne   . Other malaise and fatigue   . Painful respiration   . Palpitations   . Seborrhea capitis   . Symptomatic menopausal or female climacteric states   . Unspecified constipation   . Unspecified sleep apnea   . Unspecified vitamin D deficiency   . Vitamin D deficiency     Current Outpatient Prescriptions  Medication Sig Dispense Refill  . Armodafinil 250 MG tablet TAKE 1 TABLET BY MOUTH EVERY DAY 30 tablet 0  . aspirin 81 MG tablet Take 81 mg by mouth daily.    . furosemide (LASIX) 20 MG tablet Take 1 tablet (20 mg total) by mouth as needed for fluid or edema. 30 tablet 6  . naproxen (NAPROSYN) 500 MG tablet Take 1 tablet (500 mg total) by mouth 2 (two) times daily. 90 tablet 0  . Potassium Chloride ER 20 MEQ TBCR Take 20 mEq by mouth as needed. Take 1 tablet with lasix. 30 tablet 6  . valACYclovir (VALTREX) 500 MG tablet Take 1 tablet (500 mg total) by mouth daily. And twice daily for outbreaks 35 tablet 12  . Vitamin D, Ergocalciferol, (DRISDOL) 50000 units CAPS capsule Take 1 capsule (50,000 Units total)  by mouth every 7 (seven) days. 12 capsule 0   No current facility-administered medications for this encounter.     No Known Allergies    Social History   Social History  . Marital status: Divorced    Spouse name: N/A  . Number of children: N/A  . Years of education: N/A   Occupational History  . Not on file.   Social History Main Topics  . Smoking status: Never Smoker  . Smokeless tobacco: Never Used  . Alcohol use 0.0 oz/week     Comment: occassional  . Drug use: No  . Sexual activity: Not Currently   Other Topics Concern  . Not on file   Social History Narrative  . No narrative on file      Family History  Problem Relation Age of Onset  . Heart attack Father   . Heart attack Paternal Uncle     Vitals:   09/25/17 1540  BP: 122/88  Pulse: 87  SpO2: 95%    Weight: 201 lb 12.8 oz (91.5 kg)   Wt Readings from Last 3 Encounters:  09/25/17 201 lb 12.8 oz (91.5 kg)  08/30/17 201 lb (91.2 kg)  08/08/17 200 lb (90.7 kg)    PHYSICAL EXAM: General: Well appearing. No resp difficulty. HEENT: Normal Neck: Supple. JVP 5-6. Carotids 2+ bilat; no bruits. No thyromegaly or nodule noted. Cor: PMI nondisplaced. RRR, No M/G/R noted Lungs: CTAB, normal effort. Abdomen: Obese, soft, non-tender, non-distended, no HSM. No bruits or masses. +BS  Extremities: No cyanosis, clubbing, rash, trace edema.  Neuro: Alert & orientedx3, cranial nerves grossly intact. moves all 4 extremities w/o difficulty. Affect pleasant   ECG: 08/30/17 NSR 74 bpm. No ST-T wave abnormalities  ASSESSMENT & PLAN: 1. Dyspnea on exertion - Echo 08/14/17 with Normal EF and Grade 1 DD.  - CPX  08/16/17 with no clear evidence of cardiac limitation. Mild FEV1 decrease post exercise, but otherwise unremarkable.  - We reviewed all of her testing in detail. No evidence of cardiac limitation. I suspect main issue is her stress at work and we discussed strategies to reduce. Have suggested she get involved with an exercise group and protect time to exercise and relieve stress with friend 3-4x/week.  - Can also use lasix on daily basis as needed. - Will get BMET today - No further testing at this time.  - RTC in 6 months.   Glori Bickers, MD  10:38 PM

## 2017-09-25 NOTE — Patient Instructions (Signed)
Labs today (will call for abnormal results, otherwise no news is good news)  Follow up in 6 months, please call us to schedule your appointment.

## 2017-09-28 ENCOUNTER — Ambulatory Visit: Payer: BC Managed Care – PPO

## 2017-10-02 ENCOUNTER — Ambulatory Visit: Payer: BC Managed Care – PPO | Admitting: Internal Medicine

## 2017-10-03 ENCOUNTER — Ambulatory Visit: Payer: BC Managed Care – PPO | Admitting: Family Medicine

## 2017-10-05 ENCOUNTER — Ambulatory Visit: Payer: BC Managed Care – PPO

## 2017-10-11 ENCOUNTER — Ambulatory Visit: Payer: BC Managed Care – PPO | Admitting: Internal Medicine

## 2017-10-17 ENCOUNTER — Encounter: Payer: Self-pay | Admitting: Family Medicine

## 2017-10-17 ENCOUNTER — Ambulatory Visit (INDEPENDENT_AMBULATORY_CARE_PROVIDER_SITE_OTHER): Payer: BC Managed Care – PPO | Admitting: Family Medicine

## 2017-10-17 VITALS — BP 110/76 | HR 81 | Resp 14 | Ht 65.0 in | Wt 201.2 lb

## 2017-10-17 DIAGNOSIS — Z23 Encounter for immunization: Secondary | ICD-10-CM | POA: Diagnosis not present

## 2017-10-17 DIAGNOSIS — R0602 Shortness of breath: Secondary | ICD-10-CM | POA: Diagnosis not present

## 2017-10-17 DIAGNOSIS — G4730 Sleep apnea, unspecified: Secondary | ICD-10-CM

## 2017-10-17 DIAGNOSIS — G4710 Hypersomnia, unspecified: Secondary | ICD-10-CM

## 2017-10-17 DIAGNOSIS — F330 Major depressive disorder, recurrent, mild: Secondary | ICD-10-CM

## 2017-10-17 DIAGNOSIS — F411 Generalized anxiety disorder: Secondary | ICD-10-CM

## 2017-10-17 MED ORDER — ESCITALOPRAM OXALATE 10 MG PO TABS: 10.0000 mg | ORAL_TABLET | Freq: Every day | ORAL | 0 refills | Status: DC

## 2017-10-17 MED ORDER — MODAFINIL 200 MG PO TABS: 200.0000 mg | ORAL_TABLET | Freq: Every day | ORAL | 2 refills | Status: DC

## 2017-10-17 NOTE — Progress Notes (Signed)
Name: Teresa Gallagher   MRN: [id number]    DOB: [birth year]   Date:10/17/2017       Progress Note  Subjective  Chief Complaint  Chief Complaint  Patient presents with  . Tachycardia  . Anxiety  . Shortness of Breath    HPI   Palpitation: she states symptoms are better, very seldom has symptoms, no chest pain  SOB/wheezing: she states that she has SOB with moderate activity, she also has episodes of wheezing when laughing, she has seen pulmonologist and cardiologist. Tests reviewed and within normal limits so far, except for mild concentric hypertrophy, and some wheezing at end of stress test, but negative for EIB  GAD: she has a lot of stress at work, trying to find another job, she still feels overwhelmed , willing to try medication, never took for too long. She has no energy. She feels nervous an anxious. Denies suicidal thoughts or ideation.   Obesity: reviewed labs and hgbA1C and lipid panel has improved. She is feeling SOB and not able to exercise   Patient Active Problem List   Diagnosis Date Noted  . SOB (shortness of breath) 08/29/2017  . Episodic tension-type headache, not intractable 12/20/2016  . Rib pain on right side 11/27/2015  . History of blood transfusion 07/30/2015  . GAD (generalized anxiety disorder) 07/30/2015  . Chronic constipation 07/13/2015  . Insomnia 07/13/2015  . Depression, major, recurrent, mild ([place]) 07/13/2015  . Fibromyalgia syndrome 07/13/2015  . 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 Morbid obesity ([place]) 07/13/2015  . Hypersomnia with sleep apnea 07/13/2015  . Vitamin D deficiency 07/13/2015    Past Surgical History:  Procedure Laterality Date  . BUNIONECTOMY Right 11/07/2014   Dr. Hulda Humphrey at [facility]   . CARPAL TUNNEL RELEASE Left 2012  . CESAREAN SECTION    . LAPAROSCOPIC HYSTERECTOMY     followed by 3 cuff repairs  . LIPOMA EXCISION  2012  . REFRACTIVE SURGERY Bilateral   . TONSILLECTOMY      Family History   Problem Relation Age of Onset  . Heart attack Father   . Heart attack Paternal Uncle     Social History   Social History  . Marital status: Divorced    Spouse name: N/A  . Number of children: N/A  . Years of education: N/A   Occupational History  . Not on file.   Social History Main Topics  . Smoking status: Never Smoker  . Smokeless tobacco: Never Used  . Alcohol use 0.0 oz/week     Comment: occassional  . Drug use: No  . Sexual activity: Not Currently   Other Topics Concern  . Not on file   Social History Narrative   Works at 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      Current Outpatient Prescriptions:  .  aspirin 81 MG tablet, Take 81 mg by mouth daily., Disp: , Rfl:  .  furosemide (LASIX) 20 MG tablet, Take 1 tablet (20 mg total) by mouth as needed for fluid or edema., Disp: 30 tablet, Rfl: 6 .  naproxen (NAPROSYN) 500 MG tablet, Take 1 tablet (500 mg total) by mouth 2 (two) times daily., Disp: 90 tablet, Rfl: 0 .  Potassium Chloride ER 20 MEQ TBCR, Take 20 mEq by mouth as needed. Take 1 tablet with lasix., Disp: 30 tablet, Rfl: 6 .  valACYclovir (VALTREX) 500 MG tablet, Take 1 tablet (500 mg total) by mouth daily. And twice daily for outbreaks, Disp: 35  tablet, Rfl: 12 .  Vitamin D, Ergocalciferol, (DRISDOL) 50000 units CAPS capsule, Take 1 capsule (50,000 Units total) by mouth every 7 (seven) days., Disp: 12 capsule, Rfl: 0 .  escitalopram (LEXAPRO) 10 MG tablet, Take 1 tablet (10 mg total) by mouth daily., Disp: 30 tablet, Rfl: 0 .  modafinil (PROVIGIL) 200 MG tablet, Take 1 tablet (200 mg total) by mouth daily., Disp: 30 tablet, Rfl: 2  No Known Allergies   ROS  Constitutional: Negative for fever or weight change.  Respiratory: Negative for cough , positive for shortness of breath.   Cardiovascular: Negative for chest pain , occasional  palpitations.  Gastrointestinal: Negative for abdominal pain, no bowel changes.  Musculoskeletal: Negative for  gait problem or joint swelling.  Skin: Negative for rash.  Neurological: Negative for dizziness or headache.  No other specific complaints in a complete review of systems (except as listed in HPI above).  Objective  Vitals:   10/17/17 0815  BP: 110/76  Pulse: 81  Resp: 14  SpO2: 98%  Weight: 201 lb 3.2 oz (91.3 kg)  Height: _0  (1.651 m)    Body mass index is 33.48 kg/m.  Physical Exam  Constitutional: Patient appears well-developed and well-nourished. Obese No distress.  HEENT: head atraumatic, normocephalic, pupils equal and reactive to light, neck supple, throat within normal limits Cardiovascular: Normal rate, regular rhythm and normal heart sounds.  No murmur heard. No BLE edema. Pulmonary/Chest: Effort normal and breath sounds normal. No respiratory distress. Abdominal: Soft.  There is no tenderness. Psychiatric: Patient has a normal mood and affect. behavior is normal. Judgment and thought content normal.  Recent Results (from the past 2160 hour(s))  Basic Metabolic Panel (BMET)     Status: Abnormal   Collection Time: 09/25/17  4:15 PM  Result Value Ref Range   Sodium 135 135 - 145 mmol/L   Potassium 4.3 3.5 - 5.1 mmol/L   Chloride 100 (L) 101 - 111 mmol/L   CO2 27 22 - 32 mmol/L   Glucose, Bld 90 65 - 99 mg/dL   BUN 14 6 - 20 mg/dL   Creatinine, Ser 0.94 0.44 - 1.00 mg/dL   Calcium 9.1 8.9 - 10.3 mg/dL   GFR calc non Af Amer >60 >60 mL/min   GFR calc Af Amer >60 >60 mL/min    Comment: (NOTE) The eGFR has been calculated using the CKD EPI equation. This calculation has not been validated in all clinical situations. eGFR's persistently <60 mL/min signify possible Chronic Kidney Disease.    Anion gap 8 5 - 15      PHQ2/9: Depression screen [facility] 2/9 06/19/2017 12/20/2016 11/05/2015 07/30/2015  Decreased Interest 0 0 0 3  Down, Depressed, Hopeless 0 0 1 3  PHQ - 2 Score 0 0 1 6  Altered sleeping - - - 3  Tired, decreased energy - - - 3  Change in appetite -  - - 3  Feeling bad or failure about yourself  - - - 0  Trouble concentrating - - - 3  Moving slowly or fidgety/restless - - - 3  Suicidal thoughts - - - 0  PHQ-9 Score - - - 21  Difficult doing work/chores - - - Very difficult     Fall Risk: Fall Risk  10/17/2017 06/19/2017 12/20/2016 11/05/2015 07/30/2015  Falls in the past year? _1      Functional Status Survey: Is the patient deaf or have difficulty hearing?: No Does the patient have difficulty seeing,  even when wearing glasses/contacts?: No Does the patient have difficulty concentrating, remembering, or making decisions?: No Does the patient have difficulty walking or climbing stairs?: No Does the patient have difficulty dressing or bathing?: No Does the patient have difficulty doing errands alone such as visiting a doctor's office or shopping?: No    Assessment & Plan   1. SOB (shortness of breath)  Advised to go back to pulmonologist and we will start pulmonary rehab  2. Flu vaccine need  - Flu Vaccine QUAD 36+ mos IM  3. GAD (generalized anxiety disorder) * - escitalopram (LEXAPRO) 10 MG tablet; Take 1 tablet (10 mg total) by mouth daily.  Dispense: 30 tablet; Refill: 0  4. Morbid obesity ([place])  Discussed with the patient the risk posed by an increased BMI. Discussed importance of portion control, calorie counting and at least 150 minutes of physical activity weekly. Avoid sweet beverages and drink more water. Eat at least 6 servings of fruit and vegetables daily   5. Depression, major, recurrent, mild ([facility])  - escitalopram (LEXAPRO) 10 MG tablet; Take 1 tablet (10 mg total) by mouth daily.  Dispense: 30 tablet; Refill: 0  6. Sleep apnea with hypersomnolence  - modafinil (PROVIGIL) 200 MG tablet; Take 1 tablet (200 mg total) by mouth daily.  Dispense: 30 tablet; Refill: 2

## 2017-11-21 ENCOUNTER — Telehealth: Payer: Self-pay | Admitting: Family Medicine

## 2017-11-21 ENCOUNTER — Other Ambulatory Visit: Payer: Self-pay

## 2017-11-21 DIAGNOSIS — F411 Generalized anxiety disorder: Secondary | ICD-10-CM

## 2017-11-21 DIAGNOSIS — F330 Major depressive disorder, recurrent, mild: Secondary | ICD-10-CM

## 2017-11-21 MED ORDER — ESCITALOPRAM OXALATE 10 MG PO TABS
10.0000 mg | ORAL_TABLET | Freq: Every day | ORAL | 0 refills | Status: DC
Start: 2017-11-21 — End: 2017-12-20

## 2017-11-21 NOTE — Telephone Encounter (Signed)
Refill request for general medication: Lexapro 10 mg  Last office visit: 07/03/2017  Last physical exam: 12/20/2016  Follow up visit: None indicated

## 2017-11-21 NOTE — Telephone Encounter (Signed)
Copied from [place]. Topic: Quick Communication - Rx Refill/Question >> Nov 21, 2017 10:25 AM Synthia Innocent wrote: Has the patient contacted their pharmacy? No.   (Agent: If no, request that the patient contact the pharmacy for the refill.)   Preferred Pharmacy (with phone number or street name): [facility]   Agent: Please be advised that RX refills may take up to 3 business days. We ask that you follow-up with your pharmacy. Requesting refill on escitalopram (LEXAPRO) 10 MG tablet

## 2017-11-21 NOTE — Telephone Encounter (Signed)
Pt. requesting refill on Lexapro

## 2017-11-24 ENCOUNTER — Ambulatory Visit: Payer: BC Managed Care – PPO | Admitting: Family Medicine

## 2017-11-24 ENCOUNTER — Other Ambulatory Visit: Payer: Self-pay | Admitting: Cardiovascular Disease

## 2017-11-24 NOTE — Telephone Encounter (Signed)
Rx refilled with message for patient to contact PCP for further refills.

## 2017-11-24 NOTE — Telephone Encounter (Signed)
Please review for refill.  Does Dr. Rockey Situ want to continue to refill the Vitamin D?

## 2017-12-20 ENCOUNTER — Ambulatory Visit: Payer: BC Managed Care – PPO | Admitting: Family Medicine

## 2017-12-20 ENCOUNTER — Encounter: Payer: Self-pay | Admitting: Family Medicine

## 2017-12-20 VITALS — BP 114/70 | HR 82 | Temp 97.9°F | Resp 18 | Ht 65.0 in | Wt 201.2 lb

## 2017-12-20 DIAGNOSIS — R233 Spontaneous ecchymoses: Secondary | ICD-10-CM

## 2017-12-20 DIAGNOSIS — F411 Generalized anxiety disorder: Secondary | ICD-10-CM | POA: Diagnosis not present

## 2017-12-20 DIAGNOSIS — R238 Other skin changes: Secondary | ICD-10-CM

## 2017-12-20 DIAGNOSIS — L989 Disorder of the skin and subcutaneous tissue, unspecified: Secondary | ICD-10-CM | POA: Diagnosis not present

## 2017-12-20 DIAGNOSIS — F330 Major depressive disorder, recurrent, mild: Secondary | ICD-10-CM | POA: Diagnosis not present

## 2017-12-20 MED ORDER — ESCITALOPRAM OXALATE 10 MG PO TABS: 10.0000 mg | ORAL_TABLET | Freq: Every day | ORAL | 1 refills | Status: DC

## 2017-12-20 MED ORDER — LIDOCAINE HCL (PF) 1 % IJ SOLN
2.0000 mL | Freq: Once | INTRAMUSCULAR | Status: AC
  Administered 2017-12-20: 2 mL via INTRADERMAL

## 2017-12-20 NOTE — Progress Notes (Signed)
Name: Teresa Gallagher   MRN: [id number]    DOB: [birth year]   Date:12/20/2017       Progress Note  Subjective  Chief Complaint  Chief Complaint  Patient presents with  . Follow-up    1 month F/U  . Depression    Doing well with Lexapro-helping with moods   . GAD  . Nevus    Would like to discuss taking off a mole that is bothering her on the right inner upper thigh    HPI   GAD: she has a lot of stress at work, trying to find another job, she still feels overwhelmed , we started Lexapro Nov 2018 and is responding well, helping her calm down more, able to deal with her supervisor that stresses her out.   Obesity: reviewed labs and hgbA1C and lipid panel has improved. She states SOB has resolved with Lexapro, likely from anxiety . She snacking less since started on Lexapro, no nervous eating.   Skin lesions: she has noticed increase in size of lesion on her right inner thigh, no change in color, also has a lesion on left axilla and wants to have it biopsied today  Easy bruising: since started on aspirin, advised her to back down on aspirin to a few times a week  Patient Active Problem List   Diagnosis Date Noted  . SOB (shortness of breath) 08/29/2017  . Episodic tension-type headache, not intractable 12/20/2016  . Rib pain on right side 11/27/2015  . History of blood transfusion 07/30/2015  . GAD (generalized anxiety disorder) 07/30/2015  . Chronic constipation 07/13/2015  . Insomnia 07/13/2015  . Depression, major, recurrent, mild ([place]) 07/13/2015  . Fibromyalgia syndrome 07/13/2015  . 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 Morbid obesity ([place]) 07/13/2015  . Hypersomnia with sleep apnea 07/13/2015  . Vitamin D deficiency 07/13/2015    Past Surgical History:  Procedure Laterality Date  . BUNIONECTOMY Right 11/07/2014   Dr. Hulda Humphrey at [facility]   . CARPAL TUNNEL RELEASE Left 2012  . CESAREAN SECTION    . LAPAROSCOPIC HYSTERECTOMY     followed by 3 cuff  repairs  . LIPOMA EXCISION  2012  . REFRACTIVE SURGERY Bilateral   . TONSILLECTOMY      Family History  Problem Relation Age of Onset  . Heart attack Father   . Heart attack Paternal Uncle     Social History   Socioeconomic History  . Marital status: Divorced    Spouse name: Not on file  . Number of children: Not on file  . Years of education: Not on file  . Highest education level: Not on file  Social Needs  . Financial resource strain: Not on file  . Food insecurity - worry: Not on file  . Food insecurity - inability: Not on file  . Transportation needs - medical: Not on file  . Transportation needs - non-medical: Not on file  Occupational History  . Not on file  Tobacco Use  . Smoking status: Never Smoker  . Smokeless tobacco: Never Used  Substance and Sexual Activity  . Alcohol use: Yes    Alcohol/week: 0.0 oz    Comment: occassional  . Drug use: No  . Sexual activity: Not Currently  Other Topics Concern  . Not on file  Social History Narrative   Works at 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      Current Outpatient Medications:  .  aspirin 81 MG tablet, Take 81 mg  by mouth daily., Disp: , Rfl:  .  escitalopram (LEXAPRO) 10 MG tablet, Take 1 tablet (10 mg total) by mouth daily., Disp: 30 tablet, Rfl: 0 .  furosemide (LASIX) 20 MG tablet, Take 1 tablet (20 mg total) by mouth as needed for fluid or edema., Disp: 30 tablet, Rfl: 6 .  KLOR-CON M20 20 MEQ tablet, TAKE 1 TABLET BY MOUTH AS NEEDED. TAKE 1 TABLET WITH LASIX., Disp: , Rfl: 6 .  naproxen (NAPROSYN) 500 MG tablet, Take 1 tablet (500 mg total) by mouth 2 (two) times daily., Disp: 90 tablet, Rfl: 0 .  Potassium Chloride ER 20 MEQ TBCR, Take 20 mEq by mouth as needed. Take 1 tablet with lasix., Disp: 30 tablet, Rfl: 6 .  Vitamin D, Ergocalciferol, (DRISDOL) 50000 units CAPS capsule, Take 1 capsule (50,000 Units total) by mouth every 7 (seven) days. Please contact PCP for further refills., Disp: 12  capsule, Rfl: 0 .  modafinil (PROVIGIL) 200 MG tablet, Take 1 tablet (200 mg total) by mouth daily., Disp: 30 tablet, Rfl: 2 .  valACYclovir (VALTREX) 500 MG tablet, Take 1 tablet (500 mg total) by mouth daily. And twice daily for outbreaks (Patient not taking: Reported on 12/20/2017), Disp: 35 tablet, Rfl: 12  No Known Allergies   ROS  Constitutional: Negative for fever or weight change.  Respiratory: Negative for cough and shortness of breath.   Cardiovascular: Negative for chest pain or palpitations.  Gastrointestinal: Negative for abdominal pain, no bowel changes.  Musculoskeletal: Negative for gait problem or joint swelling.  Skin: Negative for rash.  Neurological: Negative for dizziness or headache.  No other specific complaints in a complete review of systems (except as listed in HPI above).  Objective  Vitals:   12/20/17 1600  BP: 114/70  Pulse: 82  Resp: 18  Temp: 97.9 F (36.6 C)  TempSrc: Oral  SpO2: 98%  Weight: 201 lb 3.2 oz (91.3 kg)  Height: '5\' 5"'  (1.651 m)    Body mass index is 33.48 kg/m.  Physical Exam  Constitutional: Patient appears well-developed and well-nourished. Obese  No distress.  HEENT: head atraumatic, normocephalic, pupils equal and reactive to light,  neck supple, throat within normal limits Cardiovascular: Normal rate, regular rhythm and normal heart sounds.  No murmur heard. No BLE edema. Pulmonary/Chest: Effort normal and breath sounds normal. No respiratory distress. Abdominal: Soft.  There is no tenderness. Psychiatric: Patient has a normal mood and affect. behavior is normal. Judgment and thought content normal. Skin: large pedunculated lesion on right inner thigh, non-tender, skin colored. Small skin tag on left axilla  Recent Results (from the past 2160 hour(s))  Basic Metabolic Panel (BMET)     Status: Abnormal   Collection Time: 09/25/17  4:15 PM  Result Value Ref Range   Sodium 135 135 - 145 mmol/L   Potassium 4.3 3.5 - 5.1  mmol/L   Chloride 100 (L) 101 - 111 mmol/L   CO2 27 22 - 32 mmol/L   Glucose, Bld 90 65 - 99 mg/dL   BUN 14 6 - 20 mg/dL   Creatinine, Ser 0.94 0.44 - 1.00 mg/dL   Calcium 9.1 8.9 - 10.3 mg/dL   GFR calc non Af Amer >60 >60 mL/min   GFR calc Af Amer >60 >60 mL/min    Comment: (NOTE) The eGFR has been calculated using the CKD EPI equation. This calculation has not been validated in all clinical situations. eGFR's persistently <60 mL/min signify possible Chronic Kidney Disease.    Anion  gap 8 5 - 15      PHQ2/9: Depression screen [facility] 2/9 12/20/2017 10/17/2017 06/19/2017 12/20/2016 11/05/2015  Decreased Interest 1 2 0 0 0  Down, Depressed, Hopeless 1 2 0 0 1  PHQ - 2 Score 2 4 0 0 1  Altered sleeping 1 2 - - -  Tired, decreased energy 2 3 - - -  Change in appetite 1 1 - - -  Feeling bad or failure about yourself  0 0 - - -  Trouble concentrating 1 1 - - -  Moving slowly or fidgety/restless 1 1 - - -  Suicidal thoughts 0 0 - - -  PHQ-9 Score 8 12 - - -  Difficult doing work/chores Not difficult at all Very difficult - - -     Fall Risk: Fall Risk  12/20/2017 10/17/2017 06/19/2017 12/20/2016 11/05/2015  Falls in the past year? No No No No No     Functional Status Survey: Is the patient deaf or have difficulty hearing?: No Does the patient have difficulty seeing, even when wearing glasses/contacts?: No Does the patient have difficulty concentrating, remembering, or making decisions?: No Does the patient have difficulty walking or climbing stairs?: No Does the patient have difficulty dressing or bathing?: No Does the patient have difficulty doing errands alone such as visiting a doctor's office or shopping?: No    Assessment & Plan   1. GAD (generalized anxiety disorder)  - escitalopram (LEXAPRO) 10 MG tablet; Take 1 tablet (10 mg total) by mouth daily.  Dispense: 30 tablet; Refill: 1  2. Depression, major, recurrent, mild ([facility])  - escitalopram (LEXAPRO) 10 MG tablet; Take  1 tablet (10 mg total) by mouth daily.  Dispense: 30 tablet; Refill: 1  3. Morbid obesity ([place])  Discussed with the patient the risk posed by an increased BMI. Discussed importance of portion control, calorie counting and at least 150 minutes of physical activity weekly. Avoid sweet beverages and drink more water. Eat at least 6 servings of fruit and vegetables daily   4. Skin lesion, superficial  - PR DESTRUC BENIGN/PREMAL,2-14 LESIONS - pathology sent to labs  Consent signed: YES  Procedure: Skin Mass Removal Location: right inner thigh ( growing ) - large - left axilla  Equipment used: derma-blade, high temperature cautery, sterile scalpel, tweezers, curved scissors Anesthesia: 1% Lidocaine w/o Epinephrine  Cleaned and prepped: Betadine  After consent signed, are of skin prepped with betadine. Lidocaine w/o epinephrine injected into skin underneath skin mass. After properly numbed sterile equipment used to remove tag.  Specimen sent for pathology analysis. Instructed on proper care to allow for proper healing. F/U for nursing visit if needed.  5. Easy bruising  Back down on aspirin, to about 3 times a week

## 2017-12-22 LAB — PATHOLOGY

## 2017-12-22 LAB — TISSUE SPECIMEN

## 2017-12-28 ENCOUNTER — Inpatient Hospital Stay: Admission: RE | Admit: 2017-12-28 | Payer: BC Managed Care – PPO | Source: Ambulatory Visit

## 2018-01-31 ENCOUNTER — Telehealth: Payer: Self-pay

## 2018-01-31 DIAGNOSIS — F411 Generalized anxiety disorder: Secondary | ICD-10-CM

## 2018-01-31 DIAGNOSIS — F330 Major depressive disorder, recurrent, mild: Secondary | ICD-10-CM

## 2018-01-31 NOTE — Telephone Encounter (Signed)
Refill request for general medication: Escitalopram 10 mg tablets   Last office visit: 12/20/2017  Last physical exam: 12/20/2016  Follow-up on file.  None indicated

## 2018-02-02 NOTE — Telephone Encounter (Signed)
Tried calling patient to schedule an appt for her med refill, no answer no option for VM.

## 2018-02-02 NOTE — Telephone Encounter (Signed)
Appointment needed for further refills

## 2018-02-02 NOTE — Telephone Encounter (Signed)
Pt calling back and is unsure why she was contacted and stated that she needed an appt to see Dr. Ancil Boozer. She saw Dr. Ancil Boozer in January. Pt also states that she does not need a refill of escitalopram (LEXAPRO) She states that after the rx she has she will quit taking it that she is feeling better with her depression. She would like a call with instructions of how to wean off the medication.

## 2018-02-02 NOTE — Telephone Encounter (Signed)
Also would like to know the results of the biopsy of the skin growth she had removed at last appt.

## 2018-02-05 NOTE — Telephone Encounter (Signed)
Called to inform patient what Dr. Ancil Boozer advised but after ringing a few times it started beeping. No voicemail set up. Left a CRM in patient chart. Please give patient instructions.

## 2018-02-05 NOTE — Telephone Encounter (Signed)
The skin lesion was not cancer I sent the report by mychart. She really should come in, she is feeling better because she has been taking the medication, but if she wants to wean self off, she can take half pill for a week and stop

## 2018-02-12 ENCOUNTER — Other Ambulatory Visit: Payer: Self-pay

## 2018-02-12 DIAGNOSIS — F411 Generalized anxiety disorder: Secondary | ICD-10-CM

## 2018-02-12 DIAGNOSIS — F330 Major depressive disorder, recurrent, mild: Secondary | ICD-10-CM

## 2018-02-12 MED ORDER — ESCITALOPRAM OXALATE 10 MG PO TABS: 10.0000 mg | ORAL_TABLET | Freq: Every day | ORAL | 1 refills | Status: DC

## 2018-02-12 NOTE — Telephone Encounter (Signed)
Refill request for general medication: Escitalopram  10 mg  Last office visit: 12/20/2017  Last physical exam: 12/15/2015  Follow-up on file. None indicated

## 2018-02-14 ENCOUNTER — Other Ambulatory Visit: Payer: Self-pay | Admitting: Cardiovascular Disease

## 2018-02-14 NOTE — Telephone Encounter (Signed)
Please review for refill. Thanks!  

## 2018-02-14 NOTE — Telephone Encounter (Signed)
Needs to be filled by the patient's PCP. Thanks!

## 2018-07-26 ENCOUNTER — Ambulatory Visit (HOSPITAL_COMMUNITY)
Admission: EM | Admit: 2018-07-26 | Discharge: 2018-07-26 | Disposition: A | Discharge status: Home / Self Care | Payer: BC Managed Care – PPO

## 2018-07-26 ENCOUNTER — Other Ambulatory Visit: Payer: Self-pay | Admitting: Occupational Medicine

## 2018-07-26 ENCOUNTER — Ambulatory Visit: Payer: Self-pay

## 2018-07-26 DIAGNOSIS — M25561 Pain in right knee: Secondary | ICD-10-CM

## 2018-08-16 ENCOUNTER — Ambulatory Visit: Payer: BC Managed Care – PPO | Admitting: Family Medicine

## 2018-08-16 ENCOUNTER — Encounter: Payer: Self-pay | Admitting: Family Medicine

## 2018-08-16 VITALS — BP 116/72 | HR 102 | Temp 97.8°F | Resp 12 | Ht 65.0 in | Wt 213.5 lb

## 2018-08-16 DIAGNOSIS — Z23 Encounter for immunization: Secondary | ICD-10-CM | POA: Diagnosis not present

## 2018-08-16 DIAGNOSIS — S8991XA Unspecified injury of right lower leg, initial encounter: Secondary | ICD-10-CM

## 2018-08-16 NOTE — Progress Notes (Signed)
Name: Teresa Gallagher   MRN: [id number]    DOB: [birth year]   Date:08/16/2018       Progress Note  Subjective  Chief Complaint  Chief Complaint  Patient presents with  . 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  She does have FMS, but no other chronic pain issues.  She has spent the last 3 weekends resting, feels better by Monday, but throughout the week it gets worse - pain is 1/10-8/10 depending on the day and how much she uses it.   Patient Active Problem List   Diagnosis Date Noted  . SOB (shortness of breath) 08/29/2017  . Episodic tension-type headache, not intractable 12/20/2016  . Rib pain on right side 11/27/2015  . History of blood transfusion 07/30/2015  . GAD (generalized anxiety disorder) 07/30/2015  . Chronic constipation 07/13/2015  . Insomnia 07/13/2015  . Depression, major, recurrent, mild ([place]) 07/13/2015  . Fibromyalgia syndrome 07/13/2015  . 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 Morbid obesity ([place]) 07/13/2015  . Hypersomnia with sleep apnea 07/13/2015  . Vitamin D deficiency 07/13/2015    Social History   Tobacco Use  . Smoking status: Never Smoker  . Smokeless tobacco: Never Used  Substance Use Topics  . Alcohol use: Yes    Alcohol/week: 0.0 standard drinks    Comment: occassional     Current Outpatient Medications:  .  furosemide (LASIX) 20 MG tablet, Take 1 tablet (20 mg total) by mouth as needed for fluid or edema., Disp: 30 tablet, Rfl: 6 .  KLOR-CON M20 20 MEQ tablet, TAKE 1 TABLET BY MOUTH AS NEEDED. TAKE  1 TABLET WITH LASIX., Disp: , Rfl: 6 .  modafinil (PROVIGIL) 200 MG tablet, Take 1 tablet (200 mg total) by mouth daily., Disp: 30 tablet, Rfl: 2 .  naproxen (NAPROSYN) 500 MG tablet, Take 1 tablet (500 mg total) by mouth 2 (two) times daily., Disp: 90 tablet, Rfl: 0 .  valACYclovir (VALTREX) 500 MG tablet, Take 1 tablet (500 mg total) by mouth daily. And twice daily for outbreaks, Disp: 35 tablet, Rfl: 12 .  aspirin 81 MG tablet, Take 81 mg by mouth daily., Disp: , Rfl:  .  escitalopram (LEXAPRO) 10 MG tablet, Take 1 tablet (10 mg total) by mouth daily. (Patient not taking: Reported on 08/16/2018), Disp: 30 tablet, Rfl: 1 .  Potassium Chloride ER 20 MEQ TBCR, Take 20 mEq by mouth as needed. Take 1 tablet with lasix. (Patient not taking: Reported on 08/16/2018), Disp: 30 tablet, Rfl: 6 .  Vitamin D, Ergocalciferol, (DRISDOL) 50000 units CAPS capsule, Take 1 capsule (50,000 Units total) by mouth every 7 (seven) days. Please contact PCP for further refills. (Patient not taking: Reported on 08/16/2018), Disp: 12 capsule, Rfl: 0  No Known Allergies  ROS  Ten systems reviewed and is negative except as mentioned in HPI.  Objective  Vitals:   08/16/18 1310  BP: 116/72  Pulse: (!) 102  Resp: 12  Temp: 97.8 F (36.6 C)  TempSrc: Oral  SpO2: 98%  Weight: 213 lb 8 oz (96.8 kg)  Height: 5\' 5"  (1.651 m)   Body mass index is 35.53 kg/m.  Nursing Note and Vital Signs reviewed.  Physical Exam  Constitutional: Patient appears well-developed and well-nourished. No distress.  HENT: Head: Normocephalic and atraumatic. Eyes: Conjunctivae and EOM are normal. Pupils are equal, round, and reactive to light. No scleral icterus.  Neck: Normal range of motion. Neck supple. No JVD present.  Cardiovascular: Normal rate, regular rhythm and normal heart sounds.  No murmur heard. No BLE edema. Pulmonary/Chest: Effort normal and breath sounds normal. No respiratory distress. Musculoskeletal: Normal range of  motion, no joint effusions. No gross deformities. RIGHT medial knee exhibits mild amount of swelling; it is non-tender to palpation; normal popliteal fossa. No laxity to bilateral knees.  Neurological: he is alert and oriented to person, place, and time. No cranial nerve deficit. Coordination, balance, strength, speech are normal. +Antalgic gait Skin: Skin is warm and dry. No rash noted. No erythema.  Psychiatric: Patient has a normal mood and affect. behavior is normal. Judgment and thought content normal.  No results found for this or any previous visit (from the past 72 hour(s)).  Assessment & Plan  1. Injury of right knee, initial encounter - Ambulatory referral to Orthopedic Surgery

## 2018-08-16 NOTE — Addendum Note (Signed)
Addended by: Kanya Potteiger, Ulla Potash on: 08/16/2018 01:37 PM   Modules accepted: Orders

## 2018-08-16 NOTE — Patient Instructions (Signed)
Please go to [facility] between 1p-7p today for evaluation.

## 2018-09-18 ENCOUNTER — Encounter: Payer: Self-pay | Admitting: Family Medicine

## 2018-09-18 ENCOUNTER — Ambulatory Visit: Payer: BC Managed Care – PPO | Admitting: Family Medicine

## 2018-09-18 VITALS — BP 110/72 | HR 91 | Temp 98.3°F | Resp 14 | Ht 65.0 in | Wt 209.5 lb

## 2018-09-18 DIAGNOSIS — F411 Generalized anxiety disorder: Secondary | ICD-10-CM

## 2018-09-18 DIAGNOSIS — Z1159 Encounter for screening for other viral diseases: Secondary | ICD-10-CM

## 2018-09-18 DIAGNOSIS — Z131 Encounter for screening for diabetes mellitus: Secondary | ICD-10-CM

## 2018-09-18 DIAGNOSIS — M25561 Pain in right knee: Secondary | ICD-10-CM | POA: Diagnosis not present

## 2018-09-18 DIAGNOSIS — G4710 Hypersomnia, unspecified: Secondary | ICD-10-CM

## 2018-09-18 DIAGNOSIS — R0602 Shortness of breath: Secondary | ICD-10-CM

## 2018-09-18 DIAGNOSIS — E559 Vitamin D deficiency, unspecified: Secondary | ICD-10-CM

## 2018-09-18 DIAGNOSIS — Z13220 Encounter for screening for lipoid disorders: Secondary | ICD-10-CM

## 2018-09-18 DIAGNOSIS — F330 Major depressive disorder, recurrent, mild: Secondary | ICD-10-CM

## 2018-09-18 DIAGNOSIS — B009 Herpesviral infection, unspecified: Secondary | ICD-10-CM

## 2018-09-18 DIAGNOSIS — G4730 Sleep apnea, unspecified: Secondary | ICD-10-CM

## 2018-09-18 DIAGNOSIS — E669 Obesity, unspecified: Secondary | ICD-10-CM

## 2018-09-18 DIAGNOSIS — R600 Localized edema: Secondary | ICD-10-CM

## 2018-09-18 DIAGNOSIS — Z113 Encounter for screening for infections with a predominantly sexual mode of transmission: Secondary | ICD-10-CM

## 2018-09-18 MED ORDER — MODAFINIL 200 MG PO TABS: 200.0000 mg | ORAL_TABLET | Freq: Every day | ORAL | 2 refills | Status: DC

## 2018-09-18 NOTE — Progress Notes (Signed)
Name: Teresa Gallagher   MRN: [id number]    DOB: 16-Feb-1961   Date:09/18/2018       Progress Note  Subjective  Chief Complaint  Chief Complaint  Patient presents with  . Medication Refill    provigil  . Knee Pain    patient has been having some ongoing right knee pain. she got a cortisone injection on 08/16/18 and it helped some but the pain comes and goes.     HPI   Sleep Apnea: she was diagnosed with OSA in 2016 , she has been compliant with CPAP machine, she states she keeps it on for at least 4 hours per night. During the Summer she was keeping it on for about 10 hours. She denies morning headaches, she states Provigil helps her stay alert throughout the day. She has been out of medication since May and has noticed fatigue since.   SOB/wheezing: she states that she has SOB with moderate activity, she also has episodes of wheezing when laughing, she has seen pulmonologist and cardiologist. Tests reviewed and within normal limits so far, except for mild concentric hypertrophy, and some wheezing at end of stress test, she states symptoms improved with physical activity, however since right knee injury she has been unable to exercise. She has occasional cough   GAD/mild recurrent depression: she has a long history of depression, anxiety and depression got worse in 2018, however since her boss left , the stress has improved at work, she states still not in remission, but not feeling as down on stressed as before. She stopped lexapro on her own and states she wants to stay off medications for now. Denies suicidal thoughts or ideation  Obesity: she states she has gained 9 lbs since since last year, but she has lost 7 lbs since August when she came in for an acute visit. She states once her right knee pain improves she will resume physical activity. She will try walking in the [facility] pool. Eating less carbs this past 6 weeks   Lower extremity edema: getting lasix and potassium from Dr. Rockey Situ and  states it helps with leg edema.   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 She heard a pop and developed pain immediately, she had effusion and unable to bear weight within an hour of the injury. She went to [facility] and had normal x-ray of right knee, she came here and  was given NSAID's and referred to Ortho, she was given steroid injection on right knee but still has pain and some effusion with activity. She denies instability . She states she has to take breaks when cleaning her house or going shopping.    Patient Active Problem List   Diagnosis Date Noted  . SOB (shortness of breath) 08/29/2017  . Episodic tension-type headache, not intractable 12/20/2016  . Rib pain on right side 11/27/2015  . History of blood transfusion 07/30/2015  . GAD (generalized anxiety disorder) 07/30/2015  . Chronic constipation 07/13/2015  . Insomnia 07/13/2015  . Depression, major, recurrent, mild ([place]) 07/13/2015  . Fibromyalgia syndrome 07/13/2015  . 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 Morbid obesity ([place]) 07/13/2015  . Hypersomnia with sleep apnea 07/13/2015  . Vitamin D deficiency 07/13/2015    Past Surgical History:  Procedure Laterality Date  . BUNIONECTOMY Right 11/07/2014   Dr. Hulda Humphrey at [facility]   . CARPAL TUNNEL RELEASE  Left 2012  . CESAREAN SECTION    . LAPAROSCOPIC HYSTERECTOMY     followed by 3 cuff repairs  . LIPOMA EXCISION  2012  . REFRACTIVE SURGERY Bilateral   . TONSILLECTOMY      Family History  Problem Relation Age of Onset  . Heart attack Father   . Heart attack Paternal Uncle   . COPD Sister        end stages    Social History   Socioeconomic History  . Marital status: Divorced    Spouse name: Not on file  . Number of children: 3  . Years of education: Not on file  . Highest education level: Bachelor's degree (e.g., BA, AB, BS)  Occupational History   . Not on file  Social Needs  . Financial resource strain: Somewhat hard  . Food insecurity:    Worry: Sometimes true    Inability: Sometimes true  . Transportation needs:    Medical: No    Non-medical: No  Tobacco Use  . Smoking status: Never Smoker  . Smokeless tobacco: Never Used  Substance and Sexual Activity  . Alcohol use: Yes    Alcohol/week: 0.0 standard drinks    Comment: occassional  . Drug use: No  . Sexual activity: Not Currently  Lifestyle  . Physical activity:    Days per week: 0 days    Minutes per session: 0 min  . Stress: Rather much  Relationships  . Social connections:    Talks on phone: More than three times a week    Gets together: Once a week    Attends religious service: More than 4 times per year    Active member of club or organization: No    Attends meetings of clubs or organizations: Never    Relationship status: Divorced  . Intimate partner violence:    Fear of current or ex partner: No    Emotionally abused: No    Physically abused: No    Forced sexual activity: No  Other Topics Concern  . Not on file  Social History Narrative   Works at 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       Some financial issues       She has the final say over what happens with her sister who is in the end stages of COPD     Current Outpatient Medications:  .  furosemide (LASIX) 20 MG tablet, Take 1 tablet (20 mg total) by mouth as needed for fluid or edema., Disp: 30 tablet, Rfl: 6 .  KLOR-CON M20 20 MEQ tablet, TAKE 1 TABLET BY MOUTH AS NEEDED. TAKE 1 TABLET WITH LASIX., Disp: , Rfl: 6 .  modafinil (PROVIGIL) 200 MG tablet, Take 1 tablet (200 mg total) by mouth daily., Disp: 30 tablet, Rfl: 2 .  naproxen (NAPROSYN) 500 MG tablet, Take 1 tablet (500 mg total) by mouth 2 (two) times daily., Disp: 90 tablet, Rfl: 0 .  valACYclovir (VALTREX) 500 MG tablet, Take 1 tablet (500 mg total) by mouth daily. And twice daily for outbreaks, Disp: 35 tablet, Rfl:  12  No Known Allergies  I personally reviewed active problem list, medication list, allergies, family history, social history with the patient/caregiver today.   ROS  Constitutional: Negative for fever or weight change.  Respiratory: Negative for cough and shortness of breath.   Cardiovascular: Negative for chest pain or palpitations.  Gastrointestinal: Negative for abdominal pain, no bowel changes.  Musculoskeletal: Negative for gait problem or joint swelling.  Skin: Negative for rash.  Neurological: Negative for dizziness or headache.  No other specific complaints in a complete review of systems (except as listed in HPI above).  Objective  Vitals:   09/18/18 0801  BP: 110/72  Pulse: 91  Resp: 14  Temp: 98.3 F (36.8 C)  TempSrc: Oral  SpO2: 98%  Weight: 209 lb 8 oz (95 kg)  Height: 5\' 5"  (1.651 m)    Body mass index is 34.86 kg/m.  Physical Exam  Constitutional: Patient appears well-developed and well-nourished. Obese  No distress.  HEENT: head atraumatic, normocephalic, pupils equal and reactive to light, neck supple, throat within normal limits Cardiovascular: Normal rate, regular rhythm and normal heart sounds.  No murmur heard. No BLE edema. Pulmonary/Chest: Effort normal and breath sounds normal. No respiratory distress. Abdominal: Soft.  There is no tenderness. Muscular Skeletal:  Psychiatric: Patient has a normal mood and affect. behavior is normal. Judgment and thought content normal.  PHQ2/9: Depression screen [facility] 2/9 09/18/2018 08/16/2018 12/20/2017 10/17/2017 06/19/2017  Decreased Interest 1 0 1 2 0  Down, Depressed, Hopeless 1 0 1 2 0  PHQ - 2 Score 2 0 2 4 0  Altered sleeping 0 - 1 2 -  Tired, decreased energy 2 - 2 3 -  Change in appetite 1 - 1 1 -  Feeling bad or failure about yourself  0 - 0 0 -  Trouble concentrating 0 - 1 1 -  Moving slowly or fidgety/restless 0 - 1 1 -  Suicidal thoughts 0 - 0 0 -  PHQ-9 Score 5 - 8 12 -  Difficult doing  work/chores - - Not difficult at all Very difficult -     Fall Risk: Fall Risk  09/18/2018 08/16/2018 12/20/2017 10/17/2017 06/19/2017  Falls in the past year? No No No No No    Functional Status Survey: Is the patient deaf or have difficulty hearing?: No Does the patient have difficulty seeing, even when wearing glasses/contacts?: No Does the patient have difficulty concentrating, remembering, or making decisions?: No Does the patient have difficulty walking or climbing stairs?: Yes(due to right knee pain) Does the patient have difficulty dressing or bathing?: No Does the patient have difficulty doing errands alone such as visiting a doctor's office or shopping?: No    Assessment & Plan  1. Sleep apnea with hypersomnolence  - modafinil (PROVIGIL) 200 MG tablet; Take 1 tablet (200 mg total) by mouth daily.  Dispense: 30 tablet; Refill: 2 - CBC with Differential/Platelet  2. Acute pain of right knee  She will follow up with [facility]   3. GAD (generalized anxiety disorder)   4. Vitamin D deficiency  - VITAMIN D 25 Hydroxy (Vit-D Deficiency, Fractures)  5. Herpes simplex type 2 infection  No recent episodes  6. Lower extremity edema  - COMPLETE METABOLIC PANEL WITH GFR  7. Depression, major, recurrent, mild ([facility])  Refuses medication   8. SOB (shortness of breath)   9. Screening for diabetes mellitus  - Hemoglobin A1c  10. Lipid screening  - Lipid panel  11. Obesity (BMI 30.0-34.9)  - TSH  12. Need for hepatitis C screening test  - Hepatitis C Antibody  13. Routine screening for STI (sexually transmitted infection)  - RPR - HIV Antibody (routine testing w rflx)

## 2018-09-20 LAB — HEMOGLOBIN A1C
EAG (MMOL/L): 6.3 (calc)
HEMOGLOBIN A1C: 5.6 %{Hb} (ref <5.7)
MEAN PLASMA GLUCOSE: 114 (calc)

## 2018-09-20 LAB — COMPLETE METABOLIC PANEL WITH GFR
AG Ratio: 1.8 (calc) (ref 1.0–2.5)
ALT: 13 U/L (ref 6–29)
AST: 16 U/L (ref 10–35)
Albumin: 4.4 g/dL (ref 3.6–5.1)
Alkaline phosphatase (APISO): 68 U/L (ref 33–130)
BILIRUBIN TOTAL: 0.6 mg/dL (ref 0.2–1.2)
BUN: 17 mg/dL (ref 7–25)
CHLORIDE: 102 mmol/L (ref 98–110)
CO2: 28 mmol/L (ref 20–32)
CREATININE: 0.93 mg/dL (ref 0.50–1.05)
Calcium: 9.5 mg/dL (ref 8.6–10.4)
GFR, Est African American: 79 mL/min/{1.73_m2} (ref >=60)
GFR, Est Non African American: 68 mL/min/{1.73_m2} (ref >=60)
GLOBULIN: 2.5 g/dL (ref 1.9–3.7)
Glucose, Bld: 91 mg/dL (ref 65–99)
POTASSIUM: 4.2 mmol/L (ref 3.5–5.3)
SODIUM: 138 mmol/L (ref 135–146)
Total Protein: 6.9 g/dL (ref 6.1–8.1)

## 2018-09-20 LAB — CBC WITH DIFFERENTIAL/PLATELET
BASOS ABS: 38 {cells}/uL (ref 0–200)
Basophils Relative: 0.6 %
EOS ABS: 122 {cells}/uL (ref 15–500)
EOS PCT: 1.9 %
HEMATOCRIT: 40.6 % (ref 35.0–45.0)
HEMOGLOBIN: 14 g/dL (ref 11.7–15.5)
Lymphs Abs: 1683 cells/uL (ref 850–3900)
MCH: 28.4 pg (ref 27.0–33.0)
MCHC: 34.5 g/dL (ref 32.0–36.0)
MCV: 82.4 fL (ref 80.0–100.0)
MPV: 12.3 fL (ref 7.5–12.5)
Monocytes Relative: 6.4 %
Neutro Abs: 4147 cells/uL (ref 1500–7800)
Neutrophils Relative %: 64.8 %
Platelets: 238 10*3/uL (ref 140–400)
RBC: 4.93 10*6/uL (ref 3.80–5.10)
RDW: 15.2 % — AB (ref 11.0–15.0)
TOTAL LYMPHOCYTE: 26.3 %
WBC: 6.4 10*3/uL (ref 3.8–10.8)
WBCMIX: 410 {cells}/uL (ref 200–950)

## 2018-09-20 LAB — LIPID PANEL
Cholesterol: 193 mg/dL (ref <200)
HDL: 67 mg/dL (ref >50)
LDL Cholesterol (Calc): 110 mg/dL (calc) — ABNORMAL HIGH
Non-HDL Cholesterol (Calc): 126 mg/dL (calc) (ref <130)
Total CHOL/HDL Ratio: 2.9 (calc) (ref <5.0)
Triglycerides: 69 mg/dL (ref <150)

## 2018-09-20 LAB — RPR: RPR: NONREACTIVE

## 2018-09-20 LAB — VITAMIN D 25 HYDROXY (VIT D DEFICIENCY, FRACTURES): VIT D 25 HYDROXY: 18 ng/mL — AB (ref 30–100)

## 2018-09-20 LAB — HIV ANTIBODY (ROUTINE TESTING W REFLEX): HIV 1&2 Ab, 4th Generation: NONREACTIVE

## 2018-09-20 LAB — HEPATITIS C ANTIBODY
HEP C AB: NONREACTIVE
SIGNAL TO CUT-OFF: 0.02 (ref <1.00)

## 2018-09-20 LAB — TSH: TSH: 2.03 m[IU]/L (ref 0.40–4.50)

## 2018-10-15 ENCOUNTER — Ambulatory Visit: Payer: BC Managed Care – PPO | Admitting: Family Medicine

## 2018-10-19 DIAGNOSIS — M7121 Synovial cyst of popliteal space [Baker], right knee: Secondary | ICD-10-CM

## 2018-10-19 HISTORY — DX: Synovial cyst of popliteal space (Baker), right knee: M71.21

## 2018-10-24 ENCOUNTER — Emergency Department (HOSPITAL_COMMUNITY)
Admission: EM | Admit: 2018-10-24 | Discharge: 2018-10-24 | Disposition: A | Discharge status: Home / Self Care | Payer: BC Managed Care – PPO | Attending: Emergency Medicine | Admitting: Emergency Medicine

## 2018-10-24 ENCOUNTER — Encounter (HOSPITAL_COMMUNITY): Payer: Self-pay

## 2018-10-24 ENCOUNTER — Other Ambulatory Visit: Payer: Self-pay

## 2018-10-24 ENCOUNTER — Emergency Department (HOSPITAL_BASED_OUTPATIENT_CLINIC_OR_DEPARTMENT_OTHER): Payer: BC Managed Care – PPO

## 2018-10-24 DIAGNOSIS — H1031 Unspecified acute conjunctivitis, right eye: Secondary | ICD-10-CM | POA: Diagnosis not present

## 2018-10-24 DIAGNOSIS — M7121 Synovial cyst of popliteal space [Baker], right knee: Secondary | ICD-10-CM

## 2018-10-24 DIAGNOSIS — N189 Chronic kidney disease, unspecified: Secondary | ICD-10-CM | POA: Diagnosis not present

## 2018-10-24 DIAGNOSIS — R600 Localized edema: Secondary | ICD-10-CM | POA: Insufficient documentation

## 2018-10-24 DIAGNOSIS — Z79899 Other long term (current) drug therapy: Secondary | ICD-10-CM | POA: Diagnosis not present

## 2018-10-24 DIAGNOSIS — M7989 Other specified soft tissue disorders: Secondary | ICD-10-CM

## 2018-10-24 DIAGNOSIS — R52 Pain, unspecified: Secondary | ICD-10-CM | POA: Diagnosis not present

## 2018-10-24 DIAGNOSIS — M79604 Pain in right leg: Secondary | ICD-10-CM

## 2018-10-24 DIAGNOSIS — R609 Edema, unspecified: Secondary | ICD-10-CM | POA: Diagnosis not present

## 2018-10-24 DIAGNOSIS — M79661 Pain in right lower leg: Secondary | ICD-10-CM | POA: Diagnosis present

## 2018-10-24 LAB — URINALYSIS, ROUTINE W REFLEX MICROSCOPIC
BILIRUBIN URINE: NEGATIVE
Glucose, UA: NEGATIVE mg/dL
Hgb urine dipstick: NEGATIVE
KETONES UR: NEGATIVE mg/dL
Nitrite: NEGATIVE
PH: 5 (ref 5.0–8.0)
Protein, ur: NEGATIVE mg/dL
Specific Gravity, Urine: 1.023 (ref 1.005–1.030)

## 2018-10-24 LAB — COMPREHENSIVE METABOLIC PANEL
ALBUMIN: 4.2 g/dL (ref 3.5–5.0)
ALT: 20 U/L (ref 0–44)
AST: 27 U/L (ref 15–41)
Alkaline Phosphatase: 65 U/L (ref 38–126)
Anion gap: 6 (ref 5–15)
BUN: 17 mg/dL (ref 6–20)
CHLORIDE: 105 mmol/L (ref 98–111)
CO2: 28 mmol/L (ref 22–32)
Calcium: 9 mg/dL (ref 8.9–10.3)
Creatinine, Ser: 0.88 mg/dL (ref 0.44–1.00)
GFR calc Af Amer: >60 mL/min (ref >60)
GFR calc non Af Amer: >60 mL/min (ref >60)
GLUCOSE: 106 mg/dL — AB (ref 70–99)
POTASSIUM: 3.9 mmol/L (ref 3.5–5.1)
Sodium: 139 mmol/L (ref 135–145)
Total Bilirubin: 0.8 mg/dL (ref 0.3–1.2)
Total Protein: 7.4 g/dL (ref 6.5–8.1)

## 2018-10-24 LAB — CBC WITH DIFFERENTIAL/PLATELET
ABS IMMATURE GRANULOCYTES: 0.02 10*3/uL (ref 0.00–0.07)
Basophils Absolute: 0 10*3/uL (ref 0.0–0.1)
Basophils Relative: 0 %
EOS PCT: 3 %
Eosinophils Absolute: 0.2 10*3/uL (ref 0.0–0.5)
HEMATOCRIT: 40.7 % (ref 36.0–46.0)
HEMOGLOBIN: 13.7 g/dL (ref 12.0–15.0)
Immature Granulocytes: 0 %
LYMPHS PCT: 31 %
Lymphs Abs: 2.3 10*3/uL (ref 0.7–4.0)
MCH: 28.7 pg (ref 26.0–34.0)
MCHC: 33.7 g/dL (ref 30.0–36.0)
MCV: 85.1 fL (ref 80.0–100.0)
Monocytes Absolute: 0.6 10*3/uL (ref 0.1–1.0)
Monocytes Relative: 8 %
NEUTROS ABS: 4.4 10*3/uL (ref 1.7–7.7)
Neutrophils Relative %: 58 %
PLATELETS: 246 10*3/uL (ref 150–400)
RBC: 4.78 MIL/uL (ref 3.87–5.11)
RDW: 14.1 % (ref 11.5–15.5)
WBC: 7.6 10*3/uL (ref 4.0–10.5)
nRBC: 0 % (ref 0.0–0.2)

## 2018-10-24 LAB — BRAIN NATRIURETIC PEPTIDE: B Natriuretic Peptide: 27.5 pg/mL (ref 0.0–100.0)

## 2018-10-24 MED ORDER — ERYTHROMYCIN 5 MG/GM OP OINT: TOPICAL_OINTMENT | OPHTHALMIC | 0 refills | Status: DC

## 2018-10-24 NOTE — Progress Notes (Signed)
Preliminary notes--Right lower extremity venous duplex exam completed. Negative for DVT.    A complex cystic structure with internal echoes, non-vascularity, measuring 1.21x2.84x1.59cm at upper calf medially.  Teresa Gallagher(RDMS RVT) 10/24/18 6:11 PM

## 2018-10-24 NOTE — ED Notes (Signed)
Bed: WA07 Expected date:  Expected time:  Means of arrival:  Comments: Equipment install

## 2018-10-24 NOTE — Discharge Instructions (Addendum)
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  Your work-up also showed normal BNP and labs.  We feel you are safe for discharge home and for your trip.  Due to the eye discharge and crustiness, you will be treated with antibiotics for conjunctivitis.  Please stay hydrated and follow-up with your primary doctor.  If any symptoms change or worsen, please return to the nearest emergency department.

## 2018-10-24 NOTE — ED Triage Notes (Signed)
Pt states that she has been having right calf pain for 2-3 days. Pt states that she has an old injury on that side, but that this pain is new. Pt states she is due to get on a plane tomorrow.  Pt also states she also some :white mucous" draining out of the right eye.

## 2018-10-24 NOTE — ED Notes (Addendum)
Pt reports RLE swelling and pain since August.  SHe reports hearing a "snap" with immediate pain.  She has been to doctors that her work sent her and was given steroid injections without relief.  Swelling to BLE, L pedal pulse +4 strong and bounding, R pedal pulse +3  moderate

## 2018-10-24 NOTE — ED Provider Notes (Signed)
[facility] DEPT Provider Note   CSN: [id number] Arrival date & time: 10/24/18  1249     History   Chief Complaint Chief Complaint  Patient presents with  . Leg Swelling    HPI Teresa Gallagher is a 57 y.o. female.  The history is provided by the patient and medical records. No language interpreter was used.  Leg Pain   This is a new problem. The current episode started more than 1 week ago. The problem occurs constantly. The problem has been gradually worsening. The pain is present in the right lower leg. The quality of the pain is described as aching. The pain is at a severity of 8/10. The pain is moderate. Associated symptoms include tingling (occasional tingling). Pertinent negatives include no numbness and no stiffness. She has tried nothing for the symptoms. The treatment provided no relief. There has been a history of trauma (several months ago).    Past Medical History:  Diagnosis Date  . Acne   . Arrhythmia   . Blood transfusion without reported diagnosis   . Bunion   . Cervicalgia   . Chronic kidney disease    Kidney Stents  . Depression   . Herpes simplex without mention of complication   . Mastodynia   . Myalgia and myositis, unspecified   . Other acne   . Other malaise and fatigue   . Painful respiration   . Palpitations   . Seborrhea capitis   . Symptomatic menopausal or female climacteric states   . Unspecified constipation   . Unspecified sleep apnea   . Unspecified vitamin D deficiency   . Vitamin D deficiency     Patient Active Problem List   Diagnosis Date Noted  . SOB (shortness of breath) 08/29/2017  . Episodic tension-type headache, not intractable 12/20/2016  . Rib pain on right side 11/27/2015  . History of blood transfusion 07/30/2015  . GAD (generalized anxiety disorder) 07/30/2015  . Chronic constipation 07/13/2015  . Insomnia 07/13/2015  . Depression, major, recurrent, mild ([place]) 07/13/2015  .  Fibromyalgia syndrome 07/13/2015  . 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 Morbid obesity ([place]) 07/13/2015  . Hypersomnia with sleep apnea 07/13/2015  . Vitamin D deficiency 07/13/2015    Past Surgical History:  Procedure Laterality Date  . BUNIONECTOMY Right 11/07/2014   Dr. Hulda Humph[facility] at [facility]   . CARPAL TUNNEL RELEASE Left 2012  . CESAREAN SECTION    . LAPAROSCOPIC HYSTERECTOMY     followed by 3 cuff repairs  . LIPOMA EXCISION  2012  . REFRACTIVE SURGERY Bilateral   . TONSILLECTOMY       OB History   None      Home Medications    Prior to Admission medications   Medication Sig Start Date End Date Taking? Authorizing Provider  furosemide (LASIX) 20 MG tablet Take 1 tablet (20 mg total) by mouth as needed for fluid or edema. 08/30/17   Minna Merritts, MD  KLOR-CON M20 20 MEQ tablet TAKE 1 TABLET BY MOUTH AS NEEDED. TAKE 1 TABLET WITH LASIX. 11/21/17   [provider]  modafinil (PROVIGIL) 200 MG tablet Take 1 tablet (200 mg total) by mouth daily. 09/18/18   Steele Sizer, MD  naproxen (NAPROSYN) 500 MG tablet Take 1 tablet (500 mg total) by mouth 2 (two) times daily. 12/21/16   Steele Sizer, MD  valACYclovir (VALTREX) 500 MG tablet Take 1 tablet (500 mg total) by mouth daily. And twice daily  for outbreaks 11/25/16   Steele Sizer, MD    Family History Family History  Problem Relation Age of Onset  . Heart attack Father   . Heart attack Paternal Uncle   . COPD Sister        end stages    Social History Social History   Tobacco Use  . Smoking status: Never Smoker  . Smokeless tobacco: Never Used  Substance Use Topics  . Alcohol use: Yes    Alcohol/week: 0.0 standard drinks    Comment: occassional  . Drug use: No     Allergies   Patient has no known allergies.   Review of Systems Review of Systems  Constitutional: Negative for chills, diaphoresis, fatigue and fever.  HENT: Negative for congestion.   Eyes: Negative for visual  disturbance.  Respiratory: Negative for cough, chest tightness, shortness of breath and wheezing.   Cardiovascular: Positive for leg swelling. Negative for chest pain and palpitations.  Gastrointestinal: Negative for constipation, diarrhea, nausea and vomiting.  Genitourinary: Negative for flank pain.  Musculoskeletal: Negative for back pain, neck pain, neck stiffness and stiffness.  Skin: Negative for rash and wound.  Neurological: Positive for tingling (occasional tingling). Negative for light-headedness, numbness and headaches.  All other systems reviewed and are negative.    Physical Exam Updated Vital Signs BP 135/83 (BP Location: Left Arm)   Pulse 93   Temp 99.1 F (37.3 C) (Oral)   Resp 15   Ht 5' 5.5" (1.664 m)   Wt 94.8 kg   SpO2 97%   BMI 34.25 kg/m   Physical Exam  Constitutional: She is oriented to person, place, and time. She appears well-developed and well-nourished. No distress.  HENT:  Head: Normocephalic and atraumatic.  Right Ear: External ear normal.  Left Ear: External ear normal.  Nose: Nose normal.  Mouth/Throat: Oropharynx is clear and moist. No oropharyngeal exudate.  Eyes: Pupils are equal, round, and reactive to light. Conjunctivae and EOM are normal.  Neck: Normal range of motion. Neck supple.  Cardiovascular: Normal rate and intact distal pulses.  No murmur heard. Pulmonary/Chest: No stridor. No respiratory distress. She has no wheezes. She has no rales. She exhibits no tenderness.  Abdominal: She exhibits no distension. There is no tenderness. There is no rebound.  Musculoskeletal: She exhibits edema and tenderness.       Right lower leg: She exhibits tenderness and swelling.       Legs: Patient has swelling and tenderness in her right lower extremity below the knee primarily in the calf.  Palpable DP and PT pulse.  Normal sensation and strength on exam.  Normal cap refill.  Neurological: She is alert and oriented to person, place, and time. She  has normal reflexes. No sensory deficit. She exhibits normal muscle tone. Coordination normal.  Skin: Skin is warm. Capillary refill takes less than 2 seconds. No rash noted. She is not diaphoretic. No erythema.  Psychiatric: She has a normal mood and affect.  Nursing note and vitals reviewed.    ED Treatments / Results  Labs (all labs ordered are listed, but only abnormal results are displayed) Labs Reviewed  COMPREHENSIVE METABOLIC PANEL - Abnormal; Notable for the following components:      Result Value   Glucose, Bld 106 (*)    All other components within normal limits  URINALYSIS, ROUTINE W REFLEX MICROSCOPIC - Abnormal; Notable for the following components:   Leukocytes, UA SMALL (*)    Bacteria, UA FEW (*)    All  other components within normal limits  URINE CULTURE  CBC WITH DIFFERENTIAL/PLATELET  BRAIN NATRIURETIC PEPTIDE    EKG None  Radiology No results found.  Procedures Procedures (including critical care time)  Medications Ordered in ED Medications - No data to display   Initial Impression / Assessment and Plan / ED Course  I have reviewed the triage vital signs and the nursing notes.  Pertinent labs & imaging results that were available during my care of the patient were reviewed by me and considered in my medical decision making (see chart for details).     BERLINDA FARVE is a 57 y.o. female with a past medical history significant for obesity, anxiety, and prior unspecified peripheral edema on as needed Lasix who presents with worsening right calf pain, and right leg swelling.  Patient says that she has never been diagnosed with a DVT but reports that in the past she has had work-up for this.  She says that she saw a cardiologist who said she did not have CHF but said that she had some fluid overload and this was causing her peripheral edema.  They started her on as needed Lasix and she has initially taken at several doses a month but currently takes it  several doses a week.  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  She reports that she is seen orthopedics who gave her cortisone injection that helped some with the pain but it has hurt worse recently.  She also reports that over the last few days she is started having right calf pain which is slightly different.  She reports she is flying to [place] and wanted to make sure she did not have a blood clot, fluid overload, or other abnormality.  Patient denies any chest pain, shortness of breath, palpitations.  She denies any fevers, chills, cough, congestion, abdominal pain, or back pain.  She reports that the swelling is worsened after she stands on her feet at work.  On exam, patient has tenderness in her right calf.  She has normal sensation, pulse, and strength in the legs.  She has pain with knee manipulation which she reports she has had since her injury several months ago.  She denies any new injuries.  She denies other complaints and exam is otherwise unremarkable.  Clinically I suspect patient has had worsened fluid overload causing the worsened pain in her right leg.  Patient is here for reassurance and peace of mind.  Patient will have blood work to look for kidney function abnormality, she will have a BMP look for worsened fluid overload, and she will have an ultrasound to rule out DVT.  If these tests are reassuring, anticipate patient will be stable for follow-up with her cardiologist and PCP for outpatient management.  DVT study showed no evidence of DVT.  Evidence of a cystic structure, possibly Baker's cyst was discovered.  Labs are reassuring including a normal BNP.  Patient was reassessed and said that she was having crustiness and redness in her right eye.  She was assessed and does have evidence of conjunctivitis with crusting.   Patient was given prescription for Romycin.  I suspect the patient's pain and swelling of the R calf were due to a ruptured Baker's cyst.  Patient was informed of these findings and she feels comfortable being discharged home.  Patient will continue her medications and follow-up as directed.  She understood return precautions.  Patient other worsens or concerns and was discharged in good condition.   Final Clinical Impressions(s) / ED Diagnoses   Final diagnoses:  Right leg pain  Baker cyst, right  Acute bacterial conjunctivitis of right eye    ED Discharge Orders         Ordered    erythromycin ophthalmic ointment     10/24/18 2243          Clinical Impression: 1. Right leg pain   2. Baker cyst, right   3. Acute bacterial conjunctivitis of right eye     Disposition: Discharge  Condition: Good  I have discussed the results, Dx and Tx plan with the pt(& family if present). He/she/they expressed understanding and agree(s) with the plan. Discharge instructions discussed at great length. Strict return precautions discussed and pt &/or family have verbalized understanding of the instructions. No further questions at time of discharge.    Discharge Medication List as of 10/24/2018 10:44 PM    START taking these medications   Details  erythromycin ophthalmic ointment Place a 1/2 inch ribbon of ointment into the lower eyelid., Print        Follow Up: Steele Sizer, MD [address] [place] [address] [facility] [place] [id number] mc [address] [place] [phone]       Tegeler, Gwenyth Allegra, MD 10/24/18 [id number]

## 2018-10-24 NOTE — ED Notes (Signed)
U/S was at bedside.

## 2018-10-26 LAB — URINE CULTURE

## 2018-12-18 LAB — HM MAMMOGRAPHY: HM MAMMO: NORMAL (ref 0–4)

## 2019-01-03 ENCOUNTER — Encounter: Payer: BC Managed Care – PPO | Admitting: Family Medicine

## 2019-02-18 IMAGING — DX DG CHEST 2V
2 series · 2 of 2 positions shown · non-contrast
Comparison: Chest x-ray of July 09, 2014.

CLINICAL DATA: For 5 months of worsening shortness of breath. Also
recent onset of cough and wheezing which is a new finding. No chest
pain. Nonsmoker.

EXAM:
CHEST  2 VIEW

[chest pa]
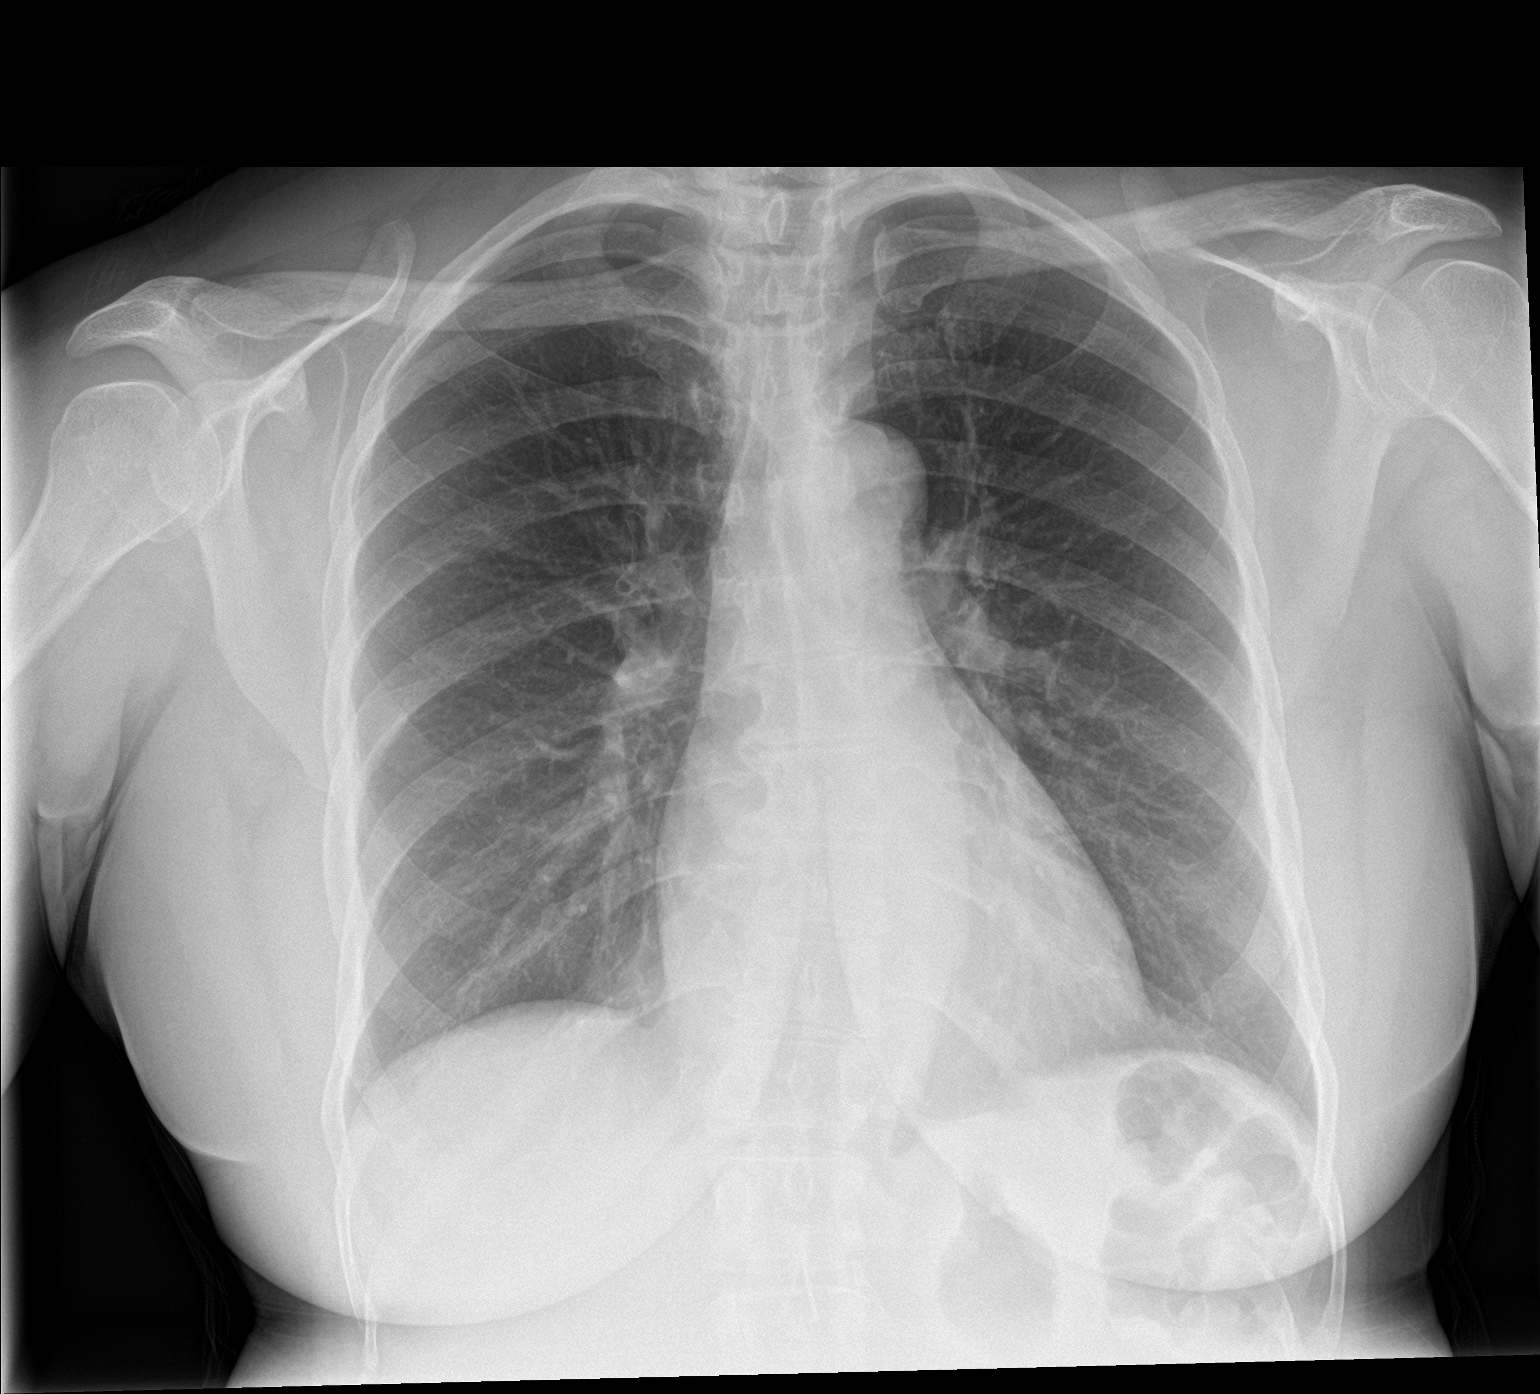

[chest lat]
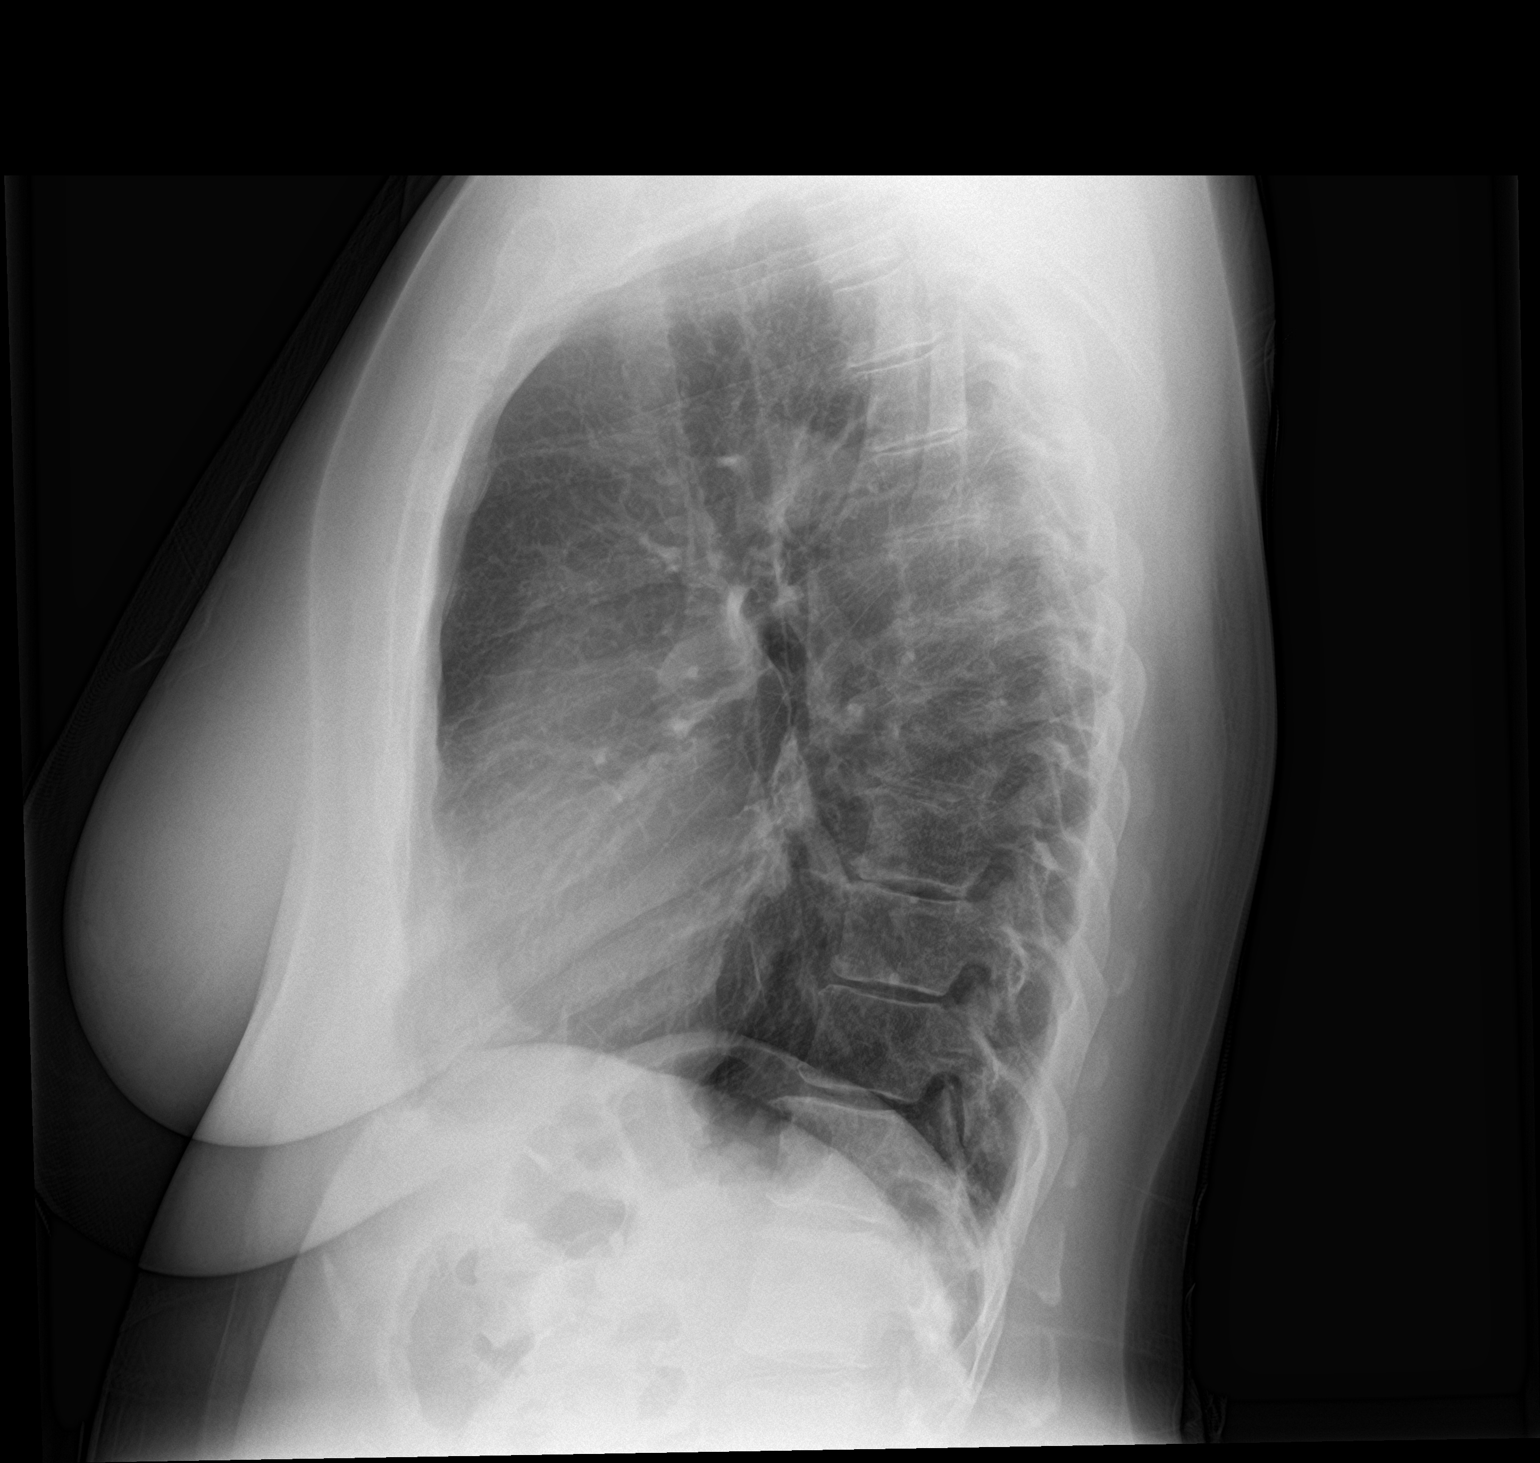

[2 of 2 positions shown; findings below may reference images not displayed]

FINDINGS: The lungs are adequately inflated and clear. There is no pleural
effusion or pneumothorax. The heart and mediastinal structures are
normal. There is mild multilevel degenerative disc disease and
gentle levocurvature of the thoracic spine.
IMPRESSION: Mild hyperinflation which may reflect air trapping or deep
inspiratory effort. No evidence of pneumonia, CHF, nor other acute
cardiopulmonary abnormality.

## 2019-03-07 ENCOUNTER — Encounter: Payer: Self-pay | Admitting: Family Medicine

## 2019-03-19 ENCOUNTER — Encounter: Payer: Self-pay | Admitting: Family Medicine

## 2019-03-20 ENCOUNTER — Encounter: Payer: Self-pay | Admitting: Family Medicine

## 2019-03-20 ENCOUNTER — Ambulatory Visit (INDEPENDENT_AMBULATORY_CARE_PROVIDER_SITE_OTHER): Payer: BC Managed Care – PPO | Admitting: Family Medicine

## 2019-03-20 DIAGNOSIS — E559 Vitamin D deficiency, unspecified: Secondary | ICD-10-CM

## 2019-03-20 DIAGNOSIS — G4710 Hypersomnia, unspecified: Secondary | ICD-10-CM

## 2019-03-20 DIAGNOSIS — R600 Localized edema: Secondary | ICD-10-CM

## 2019-03-20 DIAGNOSIS — M23206 Derangement of unspecified meniscus due to old tear or injury, right knee: Secondary | ICD-10-CM | POA: Insufficient documentation

## 2019-03-20 DIAGNOSIS — F411 Generalized anxiety disorder: Secondary | ICD-10-CM

## 2019-03-20 DIAGNOSIS — F330 Major depressive disorder, recurrent, mild: Secondary | ICD-10-CM

## 2019-03-20 DIAGNOSIS — G4730 Sleep apnea, unspecified: Secondary | ICD-10-CM

## 2019-03-20 DIAGNOSIS — R0602 Shortness of breath: Secondary | ICD-10-CM

## 2019-03-20 MED ORDER — DULOXETINE HCL 30 MG PO CPEP: 30.0000 mg | ORAL_CAPSULE | Freq: Every day | ORAL | 0 refills | Status: DC

## 2019-03-20 MED ORDER — FUROSEMIDE 20 MG PO TABS: 20.0000 mg | ORAL_TABLET | ORAL | 2 refills | Status: DC | PRN

## 2019-03-20 MED ORDER — DICLOFENAC SODIUM 50 MG PO TBEC: 50.0000 mg | DELAYED_RELEASE_TABLET | Freq: Two times a day (BID) | ORAL | 0 refills | Status: DC

## 2019-03-20 MED ORDER — VITAMIN D (ERGOCALCIFEROL) 1.25 MG (50000 UNIT) PO CAPS: 50000.0000 [IU] | ORAL_CAPSULE | ORAL | 0 refills | Status: DC

## 2019-03-20 MED ORDER — POTASSIUM CHLORIDE CRYS ER 20 MEQ PO TBCR: 20.0000 meq | EXTENDED_RELEASE_TABLET | Freq: Every day | ORAL | 2 refills | Status: DC | PRN

## 2019-03-20 MED ORDER — HYDROXYZINE HCL 10 MG PO TABS: 10.0000 mg | ORAL_TABLET | Freq: Three times a day (TID) | ORAL | 0 refills | Status: DC | PRN

## 2019-03-20 MED ORDER — MODAFINIL 200 MG PO TABS: 200.0000 mg | ORAL_TABLET | Freq: Every day | ORAL | 3 refills | Status: DC

## 2019-03-20 NOTE — Progress Notes (Signed)
Name: Teresa Gallagher   MRN: [id number]    DOB: [birth year]   Date:03/20/2019       Progress Note  Subjective  Chief Complaint  Chief Complaint  Patient presents with  . Anxiety  . Depression    I connected with@ on 03/20/19 at  7:40 AM EDT by a video enabled telemedicine application and verified that I am speaking with the correct person using two identifiers.  I discussed the limitations of evaluation and management by telemedicine and the availability of in person appointments. The patient expressed understanding and agreed to proceed. Staff also discussed with the patient that there may be a patient responsible charge related to this service. Patient Location: at home  Provider Location: [facility]    HPI   Sleep Apnea: she was diagnosed with OSA in 2016 , she has been compliant with CPAP machine, she states she keeps it on for at least 7-9  hours per night.  She denies morning headaches, she states Provigil helps her stay alert throughout the day. She need Provigil to help her stay alert .   SOB/wheezing: she states that she has SOB with moderate activity, she also has episodes of wheezing when laughing,she has seen pulmonologist and cardiologist. Tests reviewed and within normal limits so far, except for mild concentric hypertrophy, and some wheezing at end of stress test. Still has edema on legs intermittently and takes lasix and potassium prn. Seen by Dr. Rockey Situ in the past   GAD/mild recurrent depression: she has a long history of depression, anxiety and depression got worse in 2018, however when your her boss left , the stress has improved at work. 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 PHQ9 is higher   Obesity:  she has not been as active because of knee pain, she was using the pool at [facility] but no longer able to go because [employer] is closed secondary to COVID-19.   Patient Active Problem List   Diagnosis Date Noted  . Old tear of meniscus of right knee 03/20/2019  . SOB (shortness of breath) 08/29/2017  . Episodic tension-type headache, not intractable 12/20/2016  . Rib pain on right side 11/27/2015  . History of blood transfusion 07/30/2015  . GAD (generalized anxiety disorder) 07/30/2015  . Chronic constipation 07/13/2015  . Insomnia 07/13/2015  . Depression, major, recurrent, mild ([place]) 07/13/2015  . Fibromyalgia syndrome 07/13/2015  . 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 Morbid obesity ([place]) 07/13/2015  . Hypersomnia with sleep apnea 07/13/2015  . Vitamin D deficiency 07/13/2015    Past Surgical History:  Procedure Laterality Date  . BUNIONECTOMY Right 11/07/2014   Dr. Hulda Humphrey at [facility]   . CARPAL TUNNEL RELEASE Left 2012  . CESAREAN SECTION    . LAPAROSCOPIC HYSTERECTOMY     followed by 3 cuff repairs  . LIPOMA EXCISION  2012  . REFRACTIVE SURGERY Bilateral   . TONSILLECTOMY      Family History  Problem Relation Age of Onset  . Heart attack Father   . Heart attack Paternal Uncle   . COPD Sister        end stages    Social History   Socioeconomic History  . Marital status: Divorced  Spouse name: Not on file  . Number of children: 3  . Years of education: Not on file  . Highest education level: Bachelor's degree (e.g., BA, AB, BS)  Occupational History  . Not on file  Social Needs  . Financial resource strain: Somewhat hard  . Food insecurity:    Worry: Sometimes true    Inability: Sometimes true  . Transportation needs:    Medical: No    Non-medical: No  Tobacco Use  . Smoking status: Never Smoker  . Smokeless tobacco: Never Used  Substance and Sexual Activity  . Alcohol use: Yes    Alcohol/week: 0.0 standard drinks    Comment: occassional   . Drug use: No  . Sexual activity: Not Currently  Lifestyle  . Physical activity:    Days per week: 0 days    Minutes per session: 0 min  . Stress: Rather much  Relationships  . Social connections:    Talks on phone: More than three times a week    Gets together: Once a week    Attends religious service: More than 4 times per year    Active member of club or organization: No    Attends meetings of clubs or organizations: Never    Relationship status: Divorced  . Intimate partner violence:    Fear of current or ex partner: No    Emotionally abused: No    Physically abused: No    Forced sexual activity: No  Other Topics Concern  . Not on file  Social History Narrative   Works at 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       Some financial issues       She has the final say over what happens with her sister who is in the end stages of COPD     Current Outpatient Medications:  .  Ascorbic Acid (VITAMIN C PO), Take 1 tablet by mouth daily., Disp: , Rfl:  .  furosemide (LASIX) 20 MG tablet, Take 1 tablet (20 mg total) by mouth as needed for fluid or edema., Disp: 30 tablet, Rfl: 2 .  modafinil (PROVIGIL) 200 MG tablet, Take 1 tablet (200 mg total) by mouth daily., Disp: 30 tablet, Rfl: 3 .  Multiple Vitamins-Minerals (ZINC PO), Take 1 tablet by mouth daily., Disp: , Rfl:  .  potassium chloride SA (KLOR-CON M20) 20 MEQ tablet, Take 1 tablet (20 mEq total) by mouth daily as needed (take with lasix)., Disp: 30 tablet, Rfl: 2 .  Probiotic Product (PROBIOTIC PO), Take 1 tablet by mouth daily., Disp: , Rfl:  .  valACYclovir (VALTREX) 500 MG tablet, Take 1 tablet (500 mg total) by mouth daily. And twice daily for outbreaks, Disp: 35 tablet, Rfl: 12 .  diclofenac (VOLTAREN) 50 MG EC tablet, Take 1 tablet (50 mg total) by mouth 2 (two) times daily., Disp: 60 tablet, Rfl: 0 .  DULoxetine (CYMBALTA) 30 MG capsule, Take 1-2 capsules (30-60 mg total) by mouth daily. One first week with  food in am and after that 2 daily, Disp: 60 capsule, Rfl: 0 .  hydrOXYzine (ATARAX/VISTARIL) 10 MG tablet, Take 1 tablet (10 mg total) by mouth 3 (three) times daily as needed., Disp: 30 tablet, Rfl: 0  No Known Allergies  I personally reviewed active problem list, medication list, allergies, family history, social history with the patient/caregiver today.   ROS  Constitutional: Negative for fever or weight change.  Respiratory: Negative for cough and shortness of breath.   Cardiovascular: Negative  for chest pain or palpitations.  Gastrointestinal: Negative for abdominal pain, no bowel changes.  Musculoskeletal: Positive  for gait problem and right knee intermittent joint swelling.  Skin: Negative for rash.  Neurological: Negative for dizziness or headache.  No other specific complaints in a complete review of systems (except as listed in HPI above).  Objective  Virtual encounter, vitals not obtained.  There is no height or weight on file to calculate BMI.  Physical Exam  Awake, alert and oriented, in no distress    PHQ2/9: Depression screen [facility] 2/9 03/19/2019 09/18/2018 08/16/2018 12/20/2017 10/17/2017  Decreased Interest 2 1 0 1 2  Down, Depressed, Hopeless 2 1 0 1 2  PHQ - 2 Score 4 2 0 2 4  Altered sleeping 0 0 - 1 2  Tired, decreased energy 2 2 - 2 3  Change in appetite 1 1 - 1 1  Feeling bad or failure about yourself  0 0 - 0 0  Trouble concentrating 0 0 - 1 1  Moving slowly or fidgety/restless 1 0 - 1 1  Suicidal thoughts 0 0 - 0 0  PHQ-9 Score 8 5 - 8 12  Difficult doing work/chores Somewhat difficult - - Not difficult at all Very difficult   PHQ-2/9 Result is positive.    Fall Risk: Fall Risk  03/19/2019 09/18/2018 08/16/2018 12/20/2017 10/17/2017  Falls in the past year? 0 No No No No  Number falls in past yr: 0 - - - -  Injury with Fall? 0 - - - -     Assessment & Plan   1. Morbid obesity (HCC)  High BMI with co-morbidities: OA knee and sleep apnea,  discussed life style modification   2. Depression, major, recurrent, mild ([place])  She would like to resume medication , she is willing to resume Duloxetine Worried about her mother being current in [place] being treated for COVID-19   3. GAD (generalized anxiety disorder)  - hydrOXYzine (ATARAX/VISTARIL) 10 MG tablet; Take 1 tablet (10 mg total) by mouth 3 (three) times daily as needed.  Dispense: 30 tablet; Refill: 0  4. Vitamin D deficiency  Advised to continue vitamin D supplementation   5. SOB (shortness of breath)  - furosemide (LASIX) 20 MG tablet; Take 1 tablet (20 mg total) by mouth as needed for fluid or edema.  Dispense: 30 tablet; Refill: 2 - potassium chloride SA (KLOR-CON M20) 20 MEQ tablet; Take 1 tablet (20 mEq total) by mouth daily as needed (take with lasix).  Dispense: 30 tablet; Refill: 2  6. Other old tear of meniscus of right knee, unspecified meniscus  - diclofenac (VOLTAREN) 50 MG EC tablet; Take 1 tablet (50 mg total) by mouth 2 (two) times daily.  Dispense: 60 tablet; Refill: 0  7. Sleep apnea with hypersomnolence  - modafinil (PROVIGIL) 200 MG tablet; Take 1 tablet (200 mg total) by mouth daily.  Dispense: 30 tablet; Refill: 3  8. Lower extremity edema  - furosemide (LASIX) 20 MG tablet; Take 1 tablet (20 mg total) by mouth as needed for fluid or edema.  Dispense: 30 tablet; Refill: 2 - potassium chloride SA (KLOR-CON M20) 20 MEQ tablet; Take 1 tablet (20 mEq total) by mouth daily as needed (take with lasix).  Dispense: 30 tablet; Refill: 2  The patient was advised to call back or seek an in-person evaluation if the symptoms worsen or if the condition fails to improve as anticipated.  I provided 25 minutes of non-face-to-face time during this encounter.

## 2019-03-25 ENCOUNTER — Encounter: Payer: Self-pay | Admitting: Family Medicine

## 2019-04-11 ENCOUNTER — Other Ambulatory Visit: Payer: Self-pay | Admitting: Family Medicine

## 2019-04-11 DIAGNOSIS — F330 Major depressive disorder, recurrent, mild: Secondary | ICD-10-CM

## 2019-04-11 DIAGNOSIS — F411 Generalized anxiety disorder: Secondary | ICD-10-CM

## 2019-04-22 ENCOUNTER — Other Ambulatory Visit: Payer: Self-pay | Admitting: Family Medicine

## 2019-04-22 ENCOUNTER — Ambulatory Visit (INDEPENDENT_AMBULATORY_CARE_PROVIDER_SITE_OTHER): Payer: BC Managed Care – PPO | Admitting: Family Medicine

## 2019-04-22 ENCOUNTER — Encounter: Payer: Self-pay | Admitting: Family Medicine

## 2019-04-22 VITALS — BP 128/74 | HR 88 | Ht 65.0 in | Wt 205.0 lb

## 2019-04-22 DIAGNOSIS — F330 Major depressive disorder, recurrent, mild: Secondary | ICD-10-CM | POA: Diagnosis not present

## 2019-04-22 DIAGNOSIS — F411 Generalized anxiety disorder: Secondary | ICD-10-CM | POA: Diagnosis not present

## 2019-04-22 MED ORDER — ALPRAZOLAM 0.5 MG PO TABS: 0.2500 mg | ORAL_TABLET | Freq: Every day | ORAL | 0 refills | Status: DC | PRN

## 2019-04-22 NOTE — Progress Notes (Signed)
Name: Teresa Gallagher   MRN: [id number]    DOB: 08-10-1961   Date:04/22/2019       Progress Note  Subjective  Chief Complaint  Chief Complaint  Patient presents with  . Follow-up    1 month F/U  . Depression    Tried Duloxetine for a few days but was unable to tolerate it   . Anxiety    Made her too sleepy and wanted to talk about alternatives-Duloxetine    I connected with  [name]  on 04/22/19 at 11:40 AM EDT by a video enabled telemedicine application and verified that I am speaking with the correct person using two identifiers.  I discussed the limitations of evaluation and management by telemedicine and the availability of in person appointments. The patient expressed understanding and agreed to proceed. Staff also discussed with the patient that there may be a patient responsible charge related to this service. Patient Location: in [place] with her mother  Provider Location: [facility]   HPI  GAD/mild recurrent depression: she has a long history of depression, anxiety and depression got worse in 2018, however when your her boss left , the stress has improved at work. 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 We gave her Duloxetine April 1st, 2020 but it made her too sleepy, she stopped after a few days, she states she was worried since she was taking care of her mother.    Patient Active Problem List   Diagnosis Date Noted  . Old tear of meniscus of right knee 03/20/2019  . SOB (shortness of breath) 08/29/2017  . Episodic tension-type headache, not intractable 12/20/2016  . Rib pain on right side 11/27/2015  . History of blood transfusion 07/30/2015  . GAD (generalized anxiety disorder) 07/30/2015  . Chronic  constipation 07/13/2015  . Insomnia 07/13/2015  . Depression, major, recurrent, mild ([place]) 07/13/2015  . Fibromyalgia syndrome 07/13/2015  . 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 Morbid obesity ([place]) 07/13/2015  . Hypersomnia with sleep apnea 07/13/2015  . Vitamin D deficiency 07/13/2015    Past Surgical History:  Procedure Laterality Date  . BUNIONECTOMY Right 11/07/2014   Dr. Hulda Humphrey at [facility]   . CARPAL TUNNEL RELEASE Left 2012  . CESAREAN SECTION    . LAPAROSCOPIC HYSTERECTOMY     followed by 3 cuff repairs  . LIPOMA EXCISION  2012  . REFRACTIVE SURGERY Bilateral   . TONSILLECTOMY      Family History  Problem Relation Age of Onset  . Heart attack Father   . Heart attack Paternal Uncle   . COPD Sister        end stages    Social History   Socioeconomic History  . Marital status: Divorced    Spouse name: Not on file  . Number of children: 3  . Years of education: Not on file  . Highest education level: Bachelor's degree (e.g., BA, AB, BS)  Occupational History  . Not on file  Social Needs  . Financial resource strain: Somewhat hard  . Food insecurity:    Worry: Sometimes true    Inability: Sometimes true  . Transportation needs:    Medical: No    Non-medical: No  Tobacco Use  .  Smoking status: Never Smoker  . Smokeless tobacco: Never Used  Substance and Sexual Activity  . Alcohol use: Yes    Alcohol/week: 0.0 standard drinks    Comment: occassional  . Drug use: No  . Sexual activity: Not Currently  Lifestyle  . Physical activity:    Days per week: 0 days    Minutes per session: 0 min  . Stress: Rather much  Relationships  . Social connections:    Talks on phone: More than three times a week    Gets together: Once a week    Attends religious service: More than 4 times per year    Active member of club or organization: No    Attends meetings of clubs or organizations: Never    Relationship status: Divorced  . Intimate partner  violence:    Fear of current or ex partner: No    Emotionally abused: No    Physically abused: No    Forced sexual activity: No  Other Topics Concern  . Not on file  Social History Narrative   Works at 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       Some financial issues       She has the final say over what happens with her sister who is in the end stages of COPD     Current Outpatient Medications:  .  Ascorbic Acid (VITAMIN C PO), Take 1 tablet by mouth daily., Disp: , Rfl:  .  diclofenac (VOLTAREN) 50 MG EC tablet, Take 1 tablet (50 mg total) by mouth 2 (two) times daily., Disp: 60 tablet, Rfl: 0 .  furosemide (LASIX) 20 MG tablet, Take 1 tablet (20 mg total) by mouth as needed for fluid or edema., Disp: 30 tablet, Rfl: 2 .  hydrOXYzine (ATARAX/VISTARIL) 10 MG tablet, Take 1 tablet (10 mg total) by mouth 3 (three) times daily as needed., Disp: 30 tablet, Rfl: 0 .  modafinil (PROVIGIL) 200 MG tablet, Take 1 tablet (200 mg total) by mouth daily., Disp: 30 tablet, Rfl: 3 .  Multiple Vitamins-Minerals (ZINC PO), Take 1 tablet by mouth daily., Disp: , Rfl:  .  potassium chloride SA (KLOR-CON M20) 20 MEQ tablet, Take 1 tablet (20 mEq total) by mouth daily as needed (take with lasix)., Disp: 30 tablet, Rfl: 2 .  Probiotic Product (PROBIOTIC PO), Take 1 tablet by mouth daily., Disp: , Rfl:  .  valACYclovir (VALTREX) 500 MG tablet, Take 1 tablet (500 mg total) by mouth daily. And twice daily for outbreaks, Disp: 35 tablet, Rfl: 12 .  Vitamin D, Ergocalciferol, (DRISDOL) 1.25 MG (50000 UT) CAPS capsule, Take 1 capsule (50,000 Units total) by mouth every 7 (seven) days., Disp: 12 capsule, Rfl: 0  Allergies  Allergen Reactions  . Duloxetine     Somnolence     I personally reviewed active problem list, medication list, allergies, family history, social history with the patient/caregiver today.   ROS  Ten systems reviewed and is negative except as mentioned in HPI   Objective   Virtual encounter, vitals not obtained.  Body mass index is 34.11 kg/m.  Physical Exam  Awake, alert and oriented     PHQ2/9: Depression screen [facility] 2/9 04/22/2019 03/19/2019 09/18/2018 08/16/2018 12/20/2017  Decreased Interest 1 2 1  0 1  Down, Depressed, Hopeless 0 2 1 0 1  PHQ - 2 Score 1 4 2  0 2  Altered sleeping 0 0 0 - 1  Tired, decreased energy 1 2 2  - 2  Change  in appetite 0 1 1 - 1  Feeling bad or failure about yourself  0 0 0 - 0  Trouble concentrating 1 0 0 - 1  Moving slowly or fidgety/restless 1 1 0 - 1  Suicidal thoughts 0 0 0 - 0  PHQ-9 Score 4 8 5  - 8  Difficult doing work/chores Somewhat difficult Somewhat difficult - - Not difficult at all   PHQ-2/9 Result is positive.    GAD 7 : Generalized Anxiety Score 04/22/2019 03/19/2019 09/18/2018 10/17/2017  Nervous, Anxious, on Edge 1 1 1 2   Control/stop worrying 0 1 0 2  Worry too much - different things 1 1 0 3  Trouble relaxing 1 0 1 2  Restless 0 0 0 1  Easily annoyed or irritable 0 0 0 2  Afraid - awful might happen 0 1 1 1   Total GAD 7 Score 3 4 3 13   Anxiety Difficulty Somewhat difficult Somewhat difficult - Extremely difficult     Fall Risk: Fall Risk  04/22/2019 03/19/2019 09/18/2018 08/16/2018 12/20/2017  Falls in the past year? 0 0 No No No  Number falls in past yr: 0 0 - - -  Injury with Fall? 0 0 - - -    Assessment & Plan  1. GAD (generalized anxiety disorder)  - ALPRAZolam (XANAX) 0.5 MG tablet; Take 0.5 tablets (0.25 mg total) by mouth daily as needed for anxiety.  Dispense: 10 tablet; Refill: 0  2. Depression, major, recurrent, mild ([place])  Doing better   I discussed the assessment and treatment plan with the patient. The patient was provided an opportunity to ask questions and all were answered. The patient agreed with the plan and demonstrated an understanding of the instructions.  The patient was advised to call back or seek an in-person evaluation if the symptoms worsen or if the condition fails  to improve as anticipated.  I provided 15  minutes of non-face-to-face time during this encounter.

## 2019-05-07 ENCOUNTER — Other Ambulatory Visit: Payer: Self-pay | Admitting: Family Medicine

## 2019-05-07 DIAGNOSIS — R0602 Shortness of breath: Secondary | ICD-10-CM

## 2019-05-07 DIAGNOSIS — R600 Localized edema: Secondary | ICD-10-CM

## 2019-05-08 NOTE — Telephone Encounter (Signed)
Refill request for general medication.  Lasix 20 [facility] to [facility]   Last office visit 04/22/2019   Follow up on 07/31/2019

## 2019-06-05 ENCOUNTER — Other Ambulatory Visit: Payer: Self-pay | Admitting: Family Medicine

## 2019-06-05 DIAGNOSIS — E559 Vitamin D deficiency, unspecified: Secondary | ICD-10-CM

## 2019-06-11 ENCOUNTER — Other Ambulatory Visit: Payer: Self-pay | Admitting: Family Medicine

## 2019-06-11 DIAGNOSIS — R600 Localized edema: Secondary | ICD-10-CM

## 2019-06-11 DIAGNOSIS — R0602 Shortness of breath: Secondary | ICD-10-CM

## 2019-06-14 ENCOUNTER — Encounter: Payer: Self-pay | Admitting: Family Medicine

## 2019-07-10 ENCOUNTER — Encounter: Payer: Self-pay | Admitting: Family Medicine

## 2019-07-31 ENCOUNTER — Encounter: Payer: BC Managed Care – PPO | Admitting: Family Medicine

## 2019-08-02 ENCOUNTER — Encounter: Payer: Self-pay | Admitting: Family Medicine

## 2019-08-02 ENCOUNTER — Ambulatory Visit (INDEPENDENT_AMBULATORY_CARE_PROVIDER_SITE_OTHER): Payer: BC Managed Care – PPO | Admitting: Family Medicine

## 2019-08-02 ENCOUNTER — Other Ambulatory Visit: Payer: Self-pay

## 2019-08-02 VITALS — BP 100/72 | HR 80 | Temp 96.8°F | Resp 16 | Ht 63.5 in | Wt 211.6 lb

## 2019-08-02 DIAGNOSIS — Z131 Encounter for screening for diabetes mellitus: Secondary | ICD-10-CM

## 2019-08-02 DIAGNOSIS — F331 Major depressive disorder, recurrent, moderate: Secondary | ICD-10-CM

## 2019-08-02 DIAGNOSIS — R739 Hyperglycemia, unspecified: Secondary | ICD-10-CM

## 2019-08-02 DIAGNOSIS — Z01419 Encounter for gynecological examination (general) (routine) without abnormal findings: Secondary | ICD-10-CM | POA: Diagnosis not present

## 2019-08-02 DIAGNOSIS — G4730 Sleep apnea, unspecified: Secondary | ICD-10-CM

## 2019-08-02 DIAGNOSIS — Z1239 Encounter for other screening for malignant neoplasm of breast: Secondary | ICD-10-CM

## 2019-08-02 DIAGNOSIS — Z13220 Encounter for screening for lipoid disorders: Secondary | ICD-10-CM

## 2019-08-02 DIAGNOSIS — E559 Vitamin D deficiency, unspecified: Secondary | ICD-10-CM

## 2019-08-02 DIAGNOSIS — G4710 Hypersomnia, unspecified: Secondary | ICD-10-CM

## 2019-08-02 DIAGNOSIS — F411 Generalized anxiety disorder: Secondary | ICD-10-CM

## 2019-08-02 MED ORDER — MODAFINIL 200 MG PO TABS: 200.0000 mg | ORAL_TABLET | Freq: Every day | ORAL | 3 refills | Status: DC

## 2019-08-02 MED ORDER — VITAMIN D (ERGOCALCIFEROL) 1.25 MG (50000 UNIT) PO CAPS: 50000.0000 [IU] | ORAL_CAPSULE | ORAL | 0 refills | Status: DC

## 2019-08-02 MED ORDER — DULOXETINE HCL 60 MG PO CPEP: 60.0000 mg | ORAL_CAPSULE | Freq: Every day | ORAL | 0 refills | Status: DC

## 2019-08-02 NOTE — Patient Instructions (Signed)

## 2019-08-02 NOTE — Progress Notes (Signed)
Name: Teresa Gallagher   MRN: [id number]    DOB: Nov 05, 1961   Date:08/02/2019       Progress Note  Subjective  Chief Complaint  Chief Complaint  Patient presents with  . Annual Exam    HPI   Patient presents for annual CPE and follow up  Sleep Apnea: she was diagnosed with OSA in 02-10-15 , she has been compliant with CPAP machine, she states she keeps it on for at least 4 hours per night.  She denies morning headaches, she states Provigil helps her stay alert throughout the day, but not recently since depression is out of control.   GAD/mild recurrent depression: she has a long history of depression, anxiety and depression got worse in 02-10-2017, howeverwhen yourher boss left , the stress has improved at work. 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  We gave her Duloxetine April 1st, 2020 but it made her too sleepy, she stopped after a few days. She states she is very concerned about her depression now, she has difficulty with focus, needs to have stick notes everywhere, lack of motivation, she lost money from not submitting paperwork for short term disability after knee surgery in June, did not get stimulus money because she did not pay her taxes, she has not been cleaning her house , for a period of time she was not showering or taking care or herself. She was sleeping in until 10 am and going to bed very late, however recently she has been setting an alarm to get up and shower and changing her clothe. She states she stayed at home for 3 weeks, not doing anything. She is willing to get help. 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 She is feeling overwhelmed with setting it up. She states her employer would  probably help her if she asked. She states she has a staff meeting coming up and she is worried about it.   Obesity: she was down to 206 lbs at home, but weight is up to 211 lbs, she has morbid obesity based on BMI above 35 , knee pain and OSA, also has OA. She is at higher risk of complications of [id number]-[id number]   Diet: she usually eats at home Exercise: she is trying to do some senior exercises, but not over this past week.   USPSTF grade A and B recommendations    Office Visit from 08/02/2019 in [facility]  AUDIT-C Score  0    Hypertension: BP Readings from Last 3 Encounters:  08/02/19 100/72  04/22/19 128/74  10/24/18 123/69   Obesity: Wt Readings from Last 3 Encounters:  08/02/19 211 lb 9.6 oz (96 kg)  04/22/19 205 lb (93 kg)  10/24/18 209 lb (94.8 kg)   BMI Readings from Last 3 Encounters:  08/02/19 36.90 kg/m  04/22/19 34.11 kg/m  10/24/18 34.25 kg/m    Hep C Screening: 09/2018 STD testing and prevention (HIV/chl/gon/syphilis): N/A Intimate partner violence: negative  Sexual History/Pain during Intercourse: not sexually active  Menstrual History/LMP/Abnormal Bleeding: s/p hystrectomy  Incontinence Symptoms:   Advanced Care Planning: A voluntary discussion about advance care planning including the explanation and discussion of advance  directives.  Discussed health care proxy and Living will, and the patient was able to identify a health care proxy as daughter Teresa Gallagher  .  Patient does not have a living will at present time.  Breast cancer:  HM Mammogram  Date Value Ref Range Status  12/18/2018 Self Reported Normal 0-4 Bi-Rad, Self Reported Normal Final    BRCA gene screening: N/A Cervical cancer screening: N/A  Osteoporosis Screening: discussed screening, also importance of high calcium and vitamin D diet  HM Dexa Scan  Date Value Ref Range Status  09/18/2009 2.5/1.6  Final    Lipids:  Lab Results  Component Value Date   CHOL 193  09/18/2018   CHOL 182 06/20/2017   CHOL 190 12/11/2015   Lab Results  Component Value Date   HDL 67 09/18/2018   HDL 59 06/20/2017   HDL 57 12/11/2015   Lab Results  Component Value Date   LDLCALC 110 (H) 09/18/2018   LDLCALC 106 (H) 06/20/2017   LDLCALC 120 (H) 12/11/2015   Lab Results  Component Value Date   TRIG 69 09/18/2018   TRIG 87 06/20/2017   TRIG 65 12/11/2015   Lab Results  Component Value Date   CHOLHDL 2.9 09/18/2018   CHOLHDL 3.1 06/20/2017   CHOLHDL 3.3 12/11/2015   No results found for: LDLDIRECT  Glucose:  Glucose, Bld  Date Value Ref Range Status  10/24/2018 106 (H) 70 - 99 mg/dL Final  09/18/2018 91 65 - 99 mg/dL Final    Comment:    .            Fasting reference interval .   09/25/2017 90 65 - 99 mg/dL Final    Skin cancer: discussed atypical lesions Colorectal cancer: up to date Lung cancer:  Low Dose CT Chest recommended if Age 12-80 years, 30 pack-year currently smoking OR have quit w/in 15years. Patient does not qualify.   ECG:08/2017   Patient Active Problem List   Diagnosis Date Noted  . Old tear of meniscus of right knee 03/20/2019  . SOB (shortness of breath) 08/29/2017  . Episodic tension-type headache, not intractable 12/20/2016  . Rib pain on right side 11/27/2015  . History of blood transfusion 07/30/2015  . GAD (generalized anxiety disorder) 07/30/2015  . Chronic constipation 07/13/2015  . Insomnia 07/13/2015  . Depression, major, recurrent, mild ([place]) 07/13/2015  . Fibromyalgia syndrome 07/13/2015  . 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 Morbid obesity ([place]) 07/13/2015  . Hypersomnia with sleep apnea 07/13/2015  . Vitamin D deficiency 07/13/2015    Past Surgical History:  Procedure Laterality Date  . BUNIONECTOMY Right 11/07/2014   [facility]   . CARPAL TUNNEL RELEASE Left 2012  . CESAREAN SECTION    . LAPAROSCOPIC HYSTERECTOMY     followed by 3 cuff repairs  . LIPOMA EXCISION   2012  . REFRACTIVE SURGERY Bilateral   . TONSILLECTOMY      Family History  Problem Relation Age of Onset  . Heart attack Father   . Heart attack Paternal Uncle   . COPD Sister        end stages    Social History   Socioeconomic History  . Marital status: Divorced    Spouse name: Not on file  . Number of children: 3  . Years of education: Not on file  . Highest education level: Bachelor's degree (e.g., BA, AB, BS)  Occupational History  . Not on file  Social Needs  .  Financial resource strain: Somewhat hard  . Food insecurity    Worry: Sometimes true    Inability: Sometimes true  . Transportation needs    Medical: No    Non-medical: No  Tobacco Use  . Smoking status: Never Smoker  . Smokeless tobacco: Never Used  Substance and Sexual Activity  . Alcohol use: Yes    Alcohol/week: 0.0 standard drinks    Comment: occassional  . Drug use: No  . Sexual activity: Not Currently  Lifestyle  . Physical activity    Days per week: 0 days    Minutes per session: 0 min  . Stress: Rather much  Relationships  . Social connections    Talks on phone: More than three times a week    Gets together: Never    Attends religious service: More than 4 times per year    Active member of club or organization: No    Attends meetings of clubs or organizations: Never    Relationship status: Divorced  . Intimate partner violence    Fear of current or ex partner: No    Emotionally abused: No    Physically abused: No    Forced sexual activity: No  Other Topics Concern  . Not on file  Social History Narrative   Works at 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         Current Outpatient Medications:  .  ALPRAZolam (XANAX) 0.5 MG tablet, Take 0.5 tablets (0.25 mg total) by mouth daily as needed for anxiety., Disp: 10 tablet, Rfl: 0 .  Ascorbic Acid (VITAMIN C PO), Take 1 tablet by mouth daily., Disp: , Rfl:  .  diclofenac (VOLTAREN) 50 MG EC tablet, Take 1 tablet (50 mg total)  by mouth 2 (two) times daily., Disp: 60 tablet, Rfl: 0 .  furosemide (LASIX) 20 MG tablet, TAKE 1 TABLET (20 MG TOTAL) BY MOUTH AS NEEDED FOR FLUID OR EDEMA., Disp: 90 tablet, Rfl: 0 .  hydrOXYzine (ATARAX/VISTARIL) 10 MG tablet, Take 1 tablet (10 mg total) by mouth 3 (three) times daily as needed., Disp: 30 tablet, Rfl: 0 .  KLOR-CON M20 20 MEQ tablet, TAKE 1 TABLET (20 MEQ TOTAL) BY MOUTH DAILY AS NEEDED (TAKE WITH LASIX)., Disp: 90 tablet, Rfl: 0 .  modafinil (PROVIGIL) 200 MG tablet, Take 1 tablet (200 mg total) by mouth daily., Disp: 30 tablet, Rfl: 3 .  Multiple Vitamins-Minerals (ZINC PO), Take 1 tablet by mouth daily., Disp: , Rfl:  .  Probiotic Product (PROBIOTIC PO), Take 1 tablet by mouth daily., Disp: , Rfl:  .  valACYclovir (VALTREX) 500 MG tablet, Take 1 tablet (500 mg total) by mouth daily. And twice daily for outbreaks, Disp: 35 tablet, Rfl: 12 .  Vitamin D, Ergocalciferol, (DRISDOL) 1.25 MG (50000 UT) CAPS capsule, TAKE 1 CAPSULE (50,000 UNITS TOTAL) BY MOUTH EVERY 7 (SEVEN) DAYS. (Patient not taking: Reported on 08/02/2019), Disp: 12 capsule, Rfl: 0  Allergies  Allergen Reactions  . Duloxetine     Somnolence      ROS  Constitutional: Negative for fever or weight change.  Respiratory: Negative for cough and shortness of breath.   Cardiovascular: Negative for chest pain or palpitations.  Gastrointestinal: Negative for abdominal pain, no bowel changes.  Musculoskeletal: Negative for gait problem or joint swelling.  Skin: Negative for rash.  Neurological: Negative for dizziness or headache.  No other specific complaints in a complete review of systems (except as listed in HPI above).  Objective  Vitals:  08/02/19 0813  BP: 100/72  Pulse: 80  Resp: 16  Temp: (!) 96.8 F (36 C)  TempSrc: Temporal  SpO2: 98%  Weight: 211 lb 9.6 oz (96 kg)  Height: 5' 3.5" (1.613 m)    Body mass index is 36.9 kg/m.  Physical Exam  Constitutional: Patient appears  well-developed and well-nourished. No distress.  HENT: Head: Normocephalic and atraumatic. Ears: B TMs ok, no erythema or effusion; Nose: Nose normal. Mouth/Throat: Oropharynx is clear and moist. No oropharyngeal exudate.  Eyes: Conjunctivae and EOM are normal. Pupils are equal, round, and reactive to light. No scleral icterus.  Neck: Normal range of motion. Neck supple. No JVD present. No thyromegaly present.  Cardiovascular: Normal rate, regular rhythm and normal heart sounds.  No murmur heard. No BLE edema. Pulmonary/Chest: Effort normal and breath sounds normal. No respiratory distress. Abdominal: Soft. Bowel sounds are normal, no distension. There is no tenderness. no masses Breast: no lumps or masses, no nipple discharge or rashes FEMALE GENITALIA:  Not done  RECTAL: not done Musculoskeletal: Normal range of motion, no joint effusions. No gross deformities Neurological: he is alert and oriented to person, place, and time. No cranial nerve deficit. Coordination, balance, strength, speech and gait are normal.  Skin: Skin is warm and dry. No rash noted. No erythema.  Psychiatric: Patient has depressed mood, but  behavior is normal. Judgment and thought content normal.  PHQ2/9: Depression screen [facility] 2/9 08/02/2019 04/22/2019 03/19/2019 09/18/2018 08/16/2018  Decreased Interest _0 0  Down, Depressed, Hopeless 3 0 2 1 0  PHQ - 2 Score _1 0  Altered sleeping 2 0 0 0 -  Tired, decreased energy _2 -  Change in appetite 1 0 1 1 -  Feeling bad or failure about yourself  2 0 0 0 -  Trouble concentrating 3 1 0 0 -  Moving slowly or fidgety/restless _3 0 -  Suicidal thoughts 0 0 0 0 -  PHQ-9 Score _4 -  Difficult doing work/chores Extremely dIfficult Somewhat difficult Somewhat difficult - -   Positive   Fall Risk: Fall Risk  08/02/2019 04/22/2019 03/19/2019 09/18/2018 08/16/2018  Falls in the past year? 0 0 0 No No  Number falls in past yr: 0 0 0 - -  Injury with Fall? 0 0 0  - -     Functional Status Survey: Is the patient deaf or have difficulty hearing?: No Does the patient have difficulty seeing, even when wearing glasses/contacts?: No Does the patient have difficulty concentrating, remembering, or making decisions?: No Does the patient have difficulty walking or climbing stairs?: No Does the patient have difficulty dressing or bathing?: No Does the patient have difficulty doing errands alone such as visiting a doctor's office or shopping?: No   Assessment & Plan  1. Well woman exam  - CBC with Differential/Platelet - COMPLETE METABOLIC PANEL WITH GFR - Lipid panel - Hemoglobin A1c - VITAMIN D 25 Hydroxy (Vit-D Deficiency, Fractures) - TSH  2. Breast cancer screening  Mammogram in Dec   3. Vitamin D deficiency  - VITAMIN D 25 Hydroxy (Vit-D Deficiency, Fractures) - Vitamin D, Ergocalciferol, (DRISDOL) 1.25 MG (50000 UT) CAPS capsule; Take 1 capsule (50,000 Units total) by mouth every 7 (seven) days.  Dispense: 12 capsule; Refill: 0  4. Morbid obesity ([place])  Discussed with the patient the risk posed by an increased BMI. Discussed importance of portion control, calorie counting and at  least 150 minutes of physical activity weekly. Avoid sweet beverages and drink more water. Eat at least 6 servings of fruit and vegetables daily   5. Sleep apnea with hypersomnolence  - CBC with Differential/Platelet - modafinil (PROVIGIL) 200 MG tablet; Take 1 tablet (200 mg total) by mouth daily.  Dispense: 30 tablet; Refill: 3  6. GAD (generalized anxiety disorder)  - Ambulatory referral to Psychiatry - DULoxetine (CYMBALTA) 60 MG capsule; Take 1 capsule (60 mg total) by mouth daily.  Dispense: 30 capsule; Refill: 0  7. Hyperglycemia  - Hemoglobin A1c  8. Lipid screening  - Lipid panel  9. Moderate episode of recurrent major depressive disorder (HCC)  Explained we can fill an FMLA form for her , she has never seen a psychiatrist in the past, but  willing to go now  - TSH - Ambulatory referral to Psychiatry - DULoxetine (CYMBALTA) 60 MG capsule; Take 1 capsule (60 mg total) by mouth daily.  Dispense: 30 capsule; Refill: 0  -USPSTF grade A and B recommendations reviewed with patient; age-appropriate recommendations, preventive care, screening tests, etc discussed and encouraged; healthy living encouraged; see AVS for patient education given to patient -Discussed importance of 150 minutes of physical activity weekly, eat two servings of fish weekly, eat one serving of tree nuts ( cashews, pistachios, pecans, almonds.Marland Kitchen) every other day, eat 6 servings of fruit/vegetables daily and drink plenty of water and avoid sweet beverages.

## 2019-08-03 LAB — CBC WITH DIFFERENTIAL/PLATELET
Absolute Monocytes: 391 cells/uL (ref 200–950)
Basophils Absolute: 37 cells/uL (ref 0–200)
Basophils Relative: 0.6 %
Eosinophils Absolute: 161 cells/uL (ref 15–500)
Eosinophils Relative: 2.6 %
HCT: 38.8 % (ref 35.0–45.0)
Hemoglobin: 13.2 g/dL (ref 11.7–15.5)
Lymphs Abs: 1984 cells/uL (ref 850–3900)
MCH: 29.3 pg (ref 27.0–33.0)
MCHC: 34 g/dL (ref 32.0–36.0)
MCV: 86.2 fL (ref 80.0–100.0)
MPV: 12.1 fL (ref 7.5–12.5)
Monocytes Relative: 6.3 %
Neutro Abs: 3627 cells/uL (ref 1500–7800)
Neutrophils Relative %: 58.5 %
Platelets: 197 10*3/uL (ref 140–400)
RBC: 4.5 10*6/uL (ref 3.80–5.10)
RDW: 14.3 % (ref 11.0–15.0)
Total Lymphocyte: 32 %
WBC: 6.2 10*3/uL (ref 3.8–10.8)

## 2019-08-03 LAB — LIPID PANEL
Cholesterol: 193 mg/dL (ref <200)
HDL: 60 mg/dL (ref >=50)
LDL Cholesterol (Calc): 116 mg/dL (calc) — ABNORMAL HIGH
Non-HDL Cholesterol (Calc): 133 mg/dL (calc) — ABNORMAL HIGH (ref <130)
Total CHOL/HDL Ratio: 3.2 (calc) (ref <5.0)
Triglycerides: 80 mg/dL (ref <150)

## 2019-08-03 LAB — COMPLETE METABOLIC PANEL WITH GFR
AG Ratio: 1.6 (calc) (ref 1.0–2.5)
ALT: 13 U/L (ref 6–29)
AST: 16 U/L (ref 10–35)
Albumin: 4.1 g/dL (ref 3.6–5.1)
Alkaline phosphatase (APISO): 67 U/L (ref 37–153)
BUN: 15 mg/dL (ref 7–25)
CO2: 25 mmol/L (ref 20–32)
Calcium: 9.2 mg/dL (ref 8.6–10.4)
Chloride: 106 mmol/L (ref 98–110)
Creat: 0.94 mg/dL (ref 0.50–1.05)
GFR, Est African American: 78 mL/min/{1.73_m2} (ref >=60)
GFR, Est Non African American: 67 mL/min/{1.73_m2} (ref >=60)
Globulin: 2.5 g/dL (calc) (ref 1.9–3.7)
Glucose, Bld: 96 mg/dL (ref 65–99)
Potassium: 4.3 mmol/L (ref 3.5–5.3)
Sodium: 140 mmol/L (ref 135–146)
Total Bilirubin: 0.7 mg/dL (ref 0.2–1.2)
Total Protein: 6.6 g/dL (ref 6.1–8.1)

## 2019-08-03 LAB — VITAMIN D 25 HYDROXY (VIT D DEFICIENCY, FRACTURES): Vit D, 25-Hydroxy: 22 ng/mL — ABNORMAL LOW (ref 30–100)

## 2019-08-03 LAB — TSH: TSH: 1.42 mIU/L (ref 0.40–4.50)

## 2019-08-03 LAB — HEMOGLOBIN A1C
Hgb A1c MFr Bld: 5.4 % of total Hgb (ref <5.7)
Mean Plasma Glucose: 108 (calc)
eAG (mmol/L): 6 (calc)

## 2019-08-07 ENCOUNTER — Telehealth: Payer: Self-pay

## 2019-08-07 NOTE — Telephone Encounter (Signed)
Copied from [place] [id number]. Topic: Referral - Status >> Aug 07, 2019  2:45 PM Scherrie Gerlach wrote: Reason for CRM:  pt states Dr Ancil Boozer was going to refer her to a physiatrist, but she had not heard anything.  Please advise  Referral has been processed and patient was informed via a MyChart message.

## 2019-08-09 ENCOUNTER — Encounter: Payer: Self-pay | Admitting: Family Medicine

## 2019-08-20 ENCOUNTER — Other Ambulatory Visit: Payer: Self-pay | Admitting: Family Medicine

## 2019-08-20 DIAGNOSIS — R600 Localized edema: Secondary | ICD-10-CM

## 2019-08-20 DIAGNOSIS — R0602 Shortness of breath: Secondary | ICD-10-CM

## 2019-08-31 ENCOUNTER — Other Ambulatory Visit: Payer: Self-pay | Admitting: Family Medicine

## 2019-08-31 DIAGNOSIS — F331 Major depressive disorder, recurrent, moderate: Secondary | ICD-10-CM

## 2019-08-31 DIAGNOSIS — F411 Generalized anxiety disorder: Secondary | ICD-10-CM

## 2019-08-31 NOTE — Telephone Encounter (Signed)
Requested medication (s) are due for refill today: yes  Requested medication (s) are on the active medication list: yes  Last refill:  08/02/2019  Future visit scheduled: yes  Notes to clinic:  Requesting 90 day supply    Requested Prescriptions  Pending Prescriptions Disp Refills   DULoxetine (CYMBALTA) 60 MG capsule [Pharmacy Med Name: DULOXETINE HCL DR 60 MG CAP] 90 capsule 1    Sig: TAKE 1 [place]     Psychiatry: Antidepressants - SNRI Passed - 08/31/2019 10:32 AM      Passed - Last BP in normal range    BP Readings from Last 1 Encounters:  08/02/19 100/72         Passed - Valid encounter within last 6 months    Recent Outpatient Visits          4 weeks ago Moderate episode of recurrent major depressive disorder [facility])   [facility] Steele Sizer, MD   4 months ago GAD (generalized anxiety disorder)   [facility] [place], Drue Stager, MD   5 months ago Depression, major, recurrent, mild [facility])   [facility] Steele Sizer, MD   11 months ago Sleep apnea with hypersomnolence   [facility] Steele Sizer, MD   1 year ago Injury of right knee, initial encounter   [place], FNP      Future Appointments            In 3 days Steele Sizer, MD [place] - Completed PHQ-2 or PHQ-9 in the last 360 days.

## 2019-09-03 ENCOUNTER — Ambulatory Visit: Payer: BC Managed Care – PPO | Admitting: Family Medicine

## 2019-09-03 NOTE — Telephone Encounter (Signed)
Requested medication (s) are due for refill today: yes  Requested medication (s) are on the active medication list: yes  Last refill:  8/14/202  Future visit scheduled: yes  Notes to clinic:  Need Dx code    Requested Prescriptions  Pending Prescriptions Disp Refills   DULoxetine (CYMBALTA) 60 MG capsule [Pharmacy Med Name: DULOXETINE HCL DR 60 MG CAP] 90 capsule 1    Sig: TAKE 1 [place]     Psychiatry: Antidepressants - SNRI Passed - 09/02/2019  9:23 PM      Passed - Last BP in normal range    BP Readings from Last 1 Encounters:  08/02/19 100/72         Passed - Valid encounter within last 6 months    Recent Outpatient Visits          1 month ago Moderate episode of recurrent major depressive disorder [facility])   [facility] Steele Sizer, MD   4 months ago GAD (generalized anxiety disorder)   [facility] [place], Drue Stager, MD   5 months ago Depression, major, recurrent, mild [facility])   [facility] Steele Sizer, MD   11 months ago Sleep apnea with hypersomnolence   [facility] Steele Sizer, MD   1 year ago Injury of right knee, initial encounter   [place], [place]      Future Appointments            In 6 days Steele Sizer, MD [facility], [place] - Completed PHQ-2 or PHQ-9 in the last 360 days.

## 2019-09-03 NOTE — Telephone Encounter (Signed)
Called patient to check to see if she has enough medication to last her until her appt next week. No answer, left vm. If she calls back please ask if she has enough medication until her appointment.

## 2019-09-04 NOTE — Telephone Encounter (Signed)
Copied from [place] [id number]. Topic: General - Other >> Sep 04, 2019 10:53 AM Rainey Pines A wrote: Patient returning call to let nurse know that she has enough medication to last her until her upcoming appointment.

## 2019-09-09 ENCOUNTER — Encounter: Payer: Self-pay | Admitting: Family Medicine

## 2019-09-09 ENCOUNTER — Ambulatory Visit (INDEPENDENT_AMBULATORY_CARE_PROVIDER_SITE_OTHER): Payer: BC Managed Care – PPO | Admitting: Family Medicine

## 2019-09-09 VITALS — BP 117/74 | HR 77 | Temp 98.9°F | Wt 202.0 lb

## 2019-09-09 DIAGNOSIS — E669 Obesity, unspecified: Secondary | ICD-10-CM | POA: Diagnosis not present

## 2019-09-09 DIAGNOSIS — F411 Generalized anxiety disorder: Secondary | ICD-10-CM | POA: Diagnosis not present

## 2019-09-09 DIAGNOSIS — F330 Major depressive disorder, recurrent, mild: Secondary | ICD-10-CM | POA: Diagnosis not present

## 2019-09-09 MED ORDER — ALPRAZOLAM 0.5 MG PO TABS: 0.2500 mg | ORAL_TABLET | Freq: Every day | ORAL | 0 refills | Status: DC | PRN

## 2019-09-09 MED ORDER — DULOXETINE HCL 20 MG PO CPEP: 40.0000 mg | ORAL_CAPSULE | Freq: Every day | ORAL | 1 refills | Status: DC

## 2019-09-09 NOTE — Progress Notes (Signed)
Name: Teresa Gallagher   MRN: [id number]    DOB: 1961-05-16   Date:09/09/2019       Progress Note  Subjective  Chief Complaint  Chief Complaint  Patient presents with  . Follow-up    1 month F/U  . Depression    States the 30 mg works great for her but the 60 mg was too strong-unable to tolerate. States the Cymbalta gives her a little bit of Insomnia and has started taking a stool softner    I connected with  Teresa Gallagher  on 09/09/19 at  3:00 PM EDT by a video enabled telemedicine application and verified that I am speaking with the correct person using two identifiers.  I discussed the limitations of evaluation and management by telemedicine and the availability of in person appointments. The patient expressed understanding and agreed to proceed. Staff also discussed with the patient that there may be a patient responsible charge related to this service. Patient Location: at home  Provider Location: [facility]   HPI  GAD/Mild MDD: she is feeling better on duloxetine, she states has more energy, not snacking as much and has lost weight. She has noticed some constipation and also difficulty sleeping at night if she takes the medication after 9 am. She states still spent two weekends feeling tired, but yesterday she worked on her yard for hours. She is still anxious but also feels better, Could not tolerate 60 mg of duloxetine and is back on 30 mg, we will try adjusting the dose to 40 mg and monitor.   Patient Active Problem List   Diagnosis Date Noted  . Old tear of meniscus of right knee 03/20/2019  . SOB (shortness of breath) 08/29/2017  . Episodic tension-type headache, not intractable 12/20/2016  . Rib pain on right side 11/27/2015  . History of blood transfusion 07/30/2015  . GAD (generalized anxiety disorder) 07/30/2015  . Chronic constipation 07/13/2015  . Insomnia 07/13/2015  . Depression, major, recurrent, mild ([place]) 07/13/2015  . Fibromyalgia  syndrome 07/13/2015  . 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 Morbid obesity ([place]) 07/13/2015  . Hypersomnia with sleep apnea 07/13/2015  . Vitamin D deficiency 07/13/2015    Past Surgical History:  Procedure Laterality Date  . BUNIONECTOMY Right 11/07/2014   Dr. Hulda Humphrey at [facility]   . CARPAL TUNNEL RELEASE Left 2012  . CESAREAN SECTION    . LAPAROSCOPIC HYSTERECTOMY     followed by 3 cuff repairs  . LIPOMA EXCISION  2012  . REFRACTIVE SURGERY Bilateral   . TONSILLECTOMY      Family History  Problem Relation Age of Onset  . Heart attack Father   . Heart attack Paternal Uncle   . COPD Sister        end stages    Social History   Socioeconomic History  . Marital status: Divorced    Spouse name: Not on file  . Number of children: 3  . Years of education: Not on file  . Highest education level: Bachelor's degree (e.g., BA, AB, BS)  Occupational History  . Not on file  Social Needs  . Financial resource strain: Somewhat hard  . Food insecurity    Worry: Sometimes true    Inability: Sometimes true  . Transportation needs    Medical: No    Non-medical: No  Tobacco Use  . Smoking status: Never Smoker  . Smokeless tobacco: Never Used  Substance and Sexual Activity  . Alcohol  use: Yes    Alcohol/week: 0.0 standard drinks    Comment: occassional  . Drug use: No  . Sexual activity: Not Currently  Lifestyle  . Physical activity    Days per week: 0 days    Minutes per session: 0 min  . Stress: Rather much  Relationships  . Social connections    Talks on phone: More than three times a week    Gets together: Never    Attends religious service: More than 4 times per year    Active member of club or organization: No    Attends meetings of clubs or organizations: Never    Relationship status: Divorced  . Intimate partner violence    Fear of current or ex partner: No    Emotionally abused: No    Physically abused: No    Forced sexual activity: No   Other Topics Concern  . Not on file  Social History Narrative   Works at 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         Current Outpatient Medications:  .  ALPRAZolam (XANAX) 0.5 MG tablet, Take 0.5 tablets (0.25 mg total) by mouth daily as needed for anxiety., Disp: 10 tablet, Rfl: 0 .  furosemide (LASIX) 20 MG tablet, TAKE 1 TABLET (20 MG TOTAL) BY MOUTH AS NEEDED FOR FLUID OR EDEMA., Disp: 90 tablet, Rfl: 0 .  hydrOXYzine (ATARAX/VISTARIL) 10 MG tablet, Take 1 tablet (10 mg total) by mouth 3 (three) times daily as needed., Disp: 30 tablet, Rfl: 0 .  KLOR-CON M20 20 MEQ tablet, TAKE 1 TABLET (20 MEQ TOTAL) BY MOUTH DAILY AS NEEDED (TAKE WITH LASIX)., Disp: 90 tablet, Rfl: 0 .  modafinil (PROVIGIL) 200 MG tablet, Take 1 tablet (200 mg total) by mouth daily., Disp: 30 tablet, Rfl: 3 .  Multiple Vitamins-Minerals (ZINC PO), Take 1 tablet by mouth daily., Disp: , Rfl:  .  Probiotic Product (PROBIOTIC PO), Take 1 tablet by mouth daily., Disp: , Rfl:  .  valACYclovir (VALTREX) 500 MG tablet, Take 1 tablet (500 mg total) by mouth daily. And twice daily for outbreaks, Disp: 35 tablet, Rfl: 12 .  Vitamin D, Ergocalciferol, (DRISDOL) 1.25 MG (50000 UT) CAPS capsule, Take 1 capsule (50,000 Units total) by mouth every 7 (seven) days., Disp: 12 capsule, Rfl: 0 .  Ascorbic Acid (VITAMIN C PO), Take 1 tablet by mouth daily., Disp: , Rfl:  .  diclofenac (VOLTAREN) 50 MG EC tablet, Take 1 tablet (50 mg total) by mouth 2 (two) times daily. (Patient not taking: Reported on 09/09/2019), Disp: 60 tablet, Rfl: 0 .  DULoxetine (CYMBALTA) 20 MG capsule, Take 2 capsules (40 mg total) by mouth daily., Disp: 60 capsule, Rfl: 1  No Known Allergies  I personally reviewed active problem list, medication list, allergies, family history with the patient/caregiver today.   ROS  Ten systems reviewed and is negative except as mentioned in HPI   Objective  Virtual encounter  Today's Vitals   09/09/19 1602   BP: 117/74  Pulse: 77  Temp: 98.9 F (37.2 C)  SpO2: 97%  Weight: 202 lb (91.6 kg)    Body mass index is 35.22 kg/m.  Physical Exam  Awake, alert and oriented    PHQ2/9: Depression screen [facility] 2/9 09/09/2019 08/02/2019 04/22/2019 03/19/2019 09/18/2018  Decreased Interest 1 3 1 2 1   Down, Depressed, Hopeless 1 3 0 2 1  PHQ - 2 Score 2 6 1 4 2   Altered sleeping 3 2 0 0 0  Tired, decreased energy 1 3 1 2 2   Change in appetite 1 1 0 1 1  Feeling bad or failure about yourself  0 2 0 0 0  Trouble concentrating 3 3 1  0 0  Moving slowly or fidgety/restless 1 2 1 1  0  Suicidal thoughts 0 0 0 0 0  PHQ-9 Score 11 19 4 8 5   Difficult doing work/chores Somewhat difficult Extremely dIfficult Somewhat difficult Somewhat difficult -  Some recent data might be hidden   PHQ-2/9 Result is positive.    GAD 7 : Generalized Anxiety Score 09/09/2019 04/22/2019 03/19/2019 09/18/2018  Nervous, Anxious, on Edge 1 1 1 1   Control/stop worrying 1 0 1 0  Worry too much - different things 1 1 1  0  Trouble relaxing 1 1 0 1  Restless 1 0 0 0  Easily annoyed or irritable 0 0 0 0  Afraid - awful might happen 1 0 1 1  Total GAD 7 Score 6 3 4 3   Anxiety Difficulty Somewhat difficult Somewhat difficult Somewhat difficult -     Fall Risk: Fall Risk  09/09/2019 08/02/2019 04/22/2019 03/19/2019 09/18/2018  Falls in the past year? 0 0 0 0 No  Number falls in past yr: 0 0 0 0 -  Injury with Fall? 0 0 0 0 -     Assessment & Plan  1. GAD (generalized anxiety disorder)  - DULoxetine (CYMBALTA) 20 MG capsule; Take 2 capsules (40 mg total) by mouth daily.  Dispense: 60 capsule; Refill: 1  2. Obesity (BMI 30.0-34.9)  Losing weight.   3. Depression, major, recurrent, mild (HCC)  - DULoxetine (CYMBALTA) 20 MG capsule; Take 2 capsules (40 mg total) by mouth daily.  Dispense: 60 capsule; Refill: 1  I discussed the assessment and treatment plan with the patient. The patient was provided an opportunity to ask questions  and all were answered. The patient agreed with the plan and demonstrated an understanding of the instructions.  The patient was advised to call back or seek an in-person evaluation if the symptoms worsen or if the condition fails to improve as anticipated.  I provided 15  minutes of non-face-to-face time during this encounter.

## 2019-10-04 ENCOUNTER — Other Ambulatory Visit: Payer: Self-pay | Admitting: Family Medicine

## 2019-10-04 DIAGNOSIS — F411 Generalized anxiety disorder: Secondary | ICD-10-CM

## 2019-10-04 DIAGNOSIS — F330 Major depressive disorder, recurrent, mild: Secondary | ICD-10-CM

## 2019-10-04 NOTE — Telephone Encounter (Signed)
Requested medication (s) are due for refill today: yes  Requested medication (s) are on the active medication list: yes  Last refill: 09/09/2019  Future visit scheduled: no   Notes to clinic: requesting 90 day supply   Requested Prescriptions  Pending Prescriptions Disp Refills   DULoxetine (CYMBALTA) 20 MG capsule [Pharmacy Med Name: DULOXETINE HCL DR 20 MG CAP] 180 capsule 1    Sig: TAKE 2 [place]     Psychiatry: Antidepressants - SNRI Passed - 10/04/2019  1:31 PM      Passed - Last BP in normal range    BP Readings from Last 1 Encounters:  09/09/19 117/74         Passed - Valid encounter within last 6 months    Recent Outpatient Visits          3 weeks ago GAD (generalized anxiety disorder)   [facility] [place], Drue Stager, MD   2 months ago Moderate episode of recurrent major depressive disorder [facility])   [facility] Steele Sizer, MD   5 months ago GAD (generalized anxiety disorder)   [facility] Steele Sizer, MD   6 months ago Depression, major, recurrent, mild [facility])   [facility] Steele Sizer, MD   1 year ago Sleep apnea with hypersomnolence   [facility] [place], Drue Stager, MD             Passed - Completed PHQ-2 or PHQ-9 in the last 360 days.

## 2019-10-07 NOTE — Telephone Encounter (Signed)
appt scheduled for virtual visit on 11.19.2020

## 2019-10-15 ENCOUNTER — Other Ambulatory Visit: Payer: Self-pay | Admitting: Family Medicine

## 2019-10-15 DIAGNOSIS — F330 Major depressive disorder, recurrent, mild: Secondary | ICD-10-CM

## 2019-10-15 DIAGNOSIS — F411 Generalized anxiety disorder: Secondary | ICD-10-CM

## 2019-10-25 ENCOUNTER — Other Ambulatory Visit: Payer: Self-pay | Admitting: Family Medicine

## 2019-10-25 DIAGNOSIS — E559 Vitamin D deficiency, unspecified: Secondary | ICD-10-CM

## 2019-10-25 NOTE — Telephone Encounter (Signed)
Requested medication (s) are due for refill today: yes  Requested medication (s) are on the active medication list: yes  Last refill:  08/02/2019  Future visit scheduled: yes  Notes to clinic:  Refill cannot be delegated    Requested Prescriptions  Pending Prescriptions Disp Refills   Vitamin D, Ergocalciferol, (DRISDOL) 1.25 MG (50000 UT) CAPS capsule [Pharmacy Med Name: VITAMIN D2 1.25MG (50,000 UNIT)] 12 capsule 0    Sig: Take 1 capsule (50,000 Units total) by mouth every 7 (seven) days.     Endocrinology:  Vitamins - Vitamin D Supplementation Failed - 10/25/2019  1:15 AM      Failed - 50,000 IU strengths are not delegated      Failed - Phosphate in normal range and within 360 days    No results found for: PHOS       Failed - Vitamin D in normal range and within 360 days    Vit D, 25-Hydroxy  Date Value Ref Range Status  08/02/2019 22 (L) 30 - 100 ng/mL Final    Comment:    Vitamin D Status         25-OH Vitamin D: . Deficiency:                    <20 ng/mL Insufficiency:             20 - 29 ng/mL Optimal:                 > or = 30 ng/mL . For 25-OH Vitamin D testing on patients on  D2-supplementation and patients for whom quantitation  of D2 and D3 fractions is required, the [facility](TM) 25-OH VIT D, (D2,D3), LC/MS/MS is recommended: order  code [id number] (patients >54yrs). See Note 1 . Note 1 . For additional information, please refer to  http://education.[facility].com/faq/FAQ199  (This link is being provided for informational/ educational purposes only.)          Passed - Ca in normal range and within 360 days    Calcium  Date Value Ref Range Status  08/02/2019 9.2 8.6 - 10.4 mg/dL Final         Passed - Valid encounter within last 12 months    Recent Outpatient Visits          1 month ago GAD (generalized anxiety disorder)   [facility] [place], Drue Stager, MD   2 months ago Moderate episode of recurrent major depressive disorder  [facility])   [facility] Steele Sizer, MD   6 months ago GAD (generalized anxiety disorder)   [facility] Steele Sizer, MD   7 months ago Depression, major, recurrent, mild [facility])   [facility] Steele Sizer, MD   1 year ago Sleep apnea with hypersomnolence   [facility] Steele Sizer, MD      Future Appointments            In 1 week Steele Sizer, MD [facility] [place] [facility] [place]

## 2019-10-29 ENCOUNTER — Encounter: Payer: Self-pay | Admitting: Psychiatry

## 2019-10-29 ENCOUNTER — Other Ambulatory Visit: Payer: Self-pay

## 2019-10-29 ENCOUNTER — Ambulatory Visit (INDEPENDENT_AMBULATORY_CARE_PROVIDER_SITE_OTHER): Payer: BC Managed Care – PPO | Admitting: Psychiatry

## 2019-10-29 DIAGNOSIS — F1010 Alcohol abuse, uncomplicated: Secondary | ICD-10-CM

## 2019-10-29 DIAGNOSIS — F330 Major depressive disorder, recurrent, mild: Secondary | ICD-10-CM | POA: Diagnosis not present

## 2019-10-29 DIAGNOSIS — F1011 Alcohol abuse, in remission: Secondary | ICD-10-CM | POA: Insufficient documentation

## 2019-10-29 DIAGNOSIS — F411 Generalized anxiety disorder: Secondary | ICD-10-CM

## 2019-10-29 MED ORDER — ARIPIPRAZOLE 2 MG PO TABS: 2.0000 mg | ORAL_TABLET | Freq: Every day | ORAL | 1 refills | Status: DC
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
## 2019-10-29 NOTE — Patient Instructions (Signed)
Aripiprazole tablets What is this medicine? ARIPIPRAZOLE (ay ri PIP ray zole) is an atypical antipsychotic. It is used to treat schizophrenia and bipolar disorder, also known as manic-depression. It is also used to treat Tourette's disorder and some symptoms of autism. This medicine may also be used in combination with antidepressants to treat major depressive disorder. This medicine may be used for other purposes; ask your health care provider or pharmacist if you have questions. COMMON BRAND NAME(S): Abilify What should I tell my health care provider before I take this medicine? They need to know if you have any of these conditions:  dehydration  dementia  diabetes  heart disease  history of stroke  low blood counts, like low white cell, platelet, or red cell counts  Parkinson's disease  seizures  suicidal thoughts, plans, or attempt; a previous suicide attempt by you or a family member  an unusual or allergic reaction to aripiprazole, other medicines, foods, dyes, or preservatives  pregnant or trying to get pregnant  breast-feeding How should I use this medicine? Take this medicine by mouth with a glass of water. Follow the directions on the prescription label. You can take this medicine with or without food. Take your doses at regular intervals. Do not take your medicine more often than directed. Do not stop taking except on the advice of your doctor or health care professional. A special MedGuide will be given to you by the pharmacist with each prescription and refill. Be sure to read this information carefully each time. Talk to your pediatrician regarding the use of this medicine in children. While this drug may be prescribed for children as young as 14 years of age for selected conditions, precautions do apply. Overdosage: If you think you have taken too much of this medicine contact a poison control center or emergency room at once. NOTE: This medicine is only for you. Do  not share this medicine with others. What if I miss a dose? If you miss a dose, take it as soon as you can. If it is almost time for your next dose, take only that dose. Do not take double or extra doses. What may interact with this medicine? Do not take this medicine with any of the following medications:  brexpiprazole  cisapride  dronedarone  metoclopramide  pimozide  thioridazine This medicine may also interact with the following medications:  alcohol  carbamazepine  certain medicines for anxiety or sleep  certain medicines for blood pressure  certain medicines for fungal infections like ketoconazole, fluconazole, posaconazole, and itraconazole  clarithromycin  dofetilide  fluoxetine  other medicines that prolong the QT interval (cause an abnormal heart rhythm)  paroxetine  quinidine  rifampin  ziprasidone This list may not describe all possible interactions. Give your health care provider a list of all the medicines, herbs, non-prescription drugs, or dietary supplements you use. Also tell them if you smoke, drink alcohol, or use illegal drugs. Some items may interact with your medicine. What should I watch for while using this medicine? Visit your doctor or health care professional for regular checks on your progress. It may be several weeks before you see the full effects of this medicine. Do not suddenly stop taking this medicine. You may need to gradually reduce the dose. Patients and their families should watch out for worsening depression or thoughts of suicide. Also watch out for sudden changes in feelings such as feeling anxious, agitated, panicky, irritable, hostile, aggressive, impulsive, severely restless, overly excited and hyperactive, or not  being able to sleep. If this happens, especially at the beginning of antidepressant treatment or after a change in dose, call your health care professional. Dennis Bast may get dizzy or drowsy. Do not drive, use  machinery, or do anything that needs mental alertness until you know how this medicine affects you. Do not stand or sit up quickly, especially if you are an older patient. This reduces the risk of dizzy or fainting spells. Alcohol can increase dizziness and drowsiness. Avoid alcoholic drinks. This medicine can reduce the response of your body to heat or cold. Dress warmly in cold weather and stay hydrated in hot weather. If possible, avoid extreme temperatures like saunas, hot tubs, very hot or cold showers, or activities that can cause dehydration such as vigorous exercise. This medicine may cause dry eyes and blurred vision. If you wear contact lenses, you may feel some discomfort. Lubricating drops may help. See your eye doctor if the problem does not go away or is severe. This medicine may increase blood sugar. Ask your healthcare provider if changes in diet or medicines are needed if you have diabetes. There have been reports of uncontrollable and strong urges to gamble, binge eat, shop, and have sex while taking this medicine. If you experience any of these or other uncontrollable and strong urges while taking this medicine, you should report it to your health care provider as soon as possible. What side effects may I notice from receiving this medicine? Side effects that you should report to your doctor or health care professional as soon as possible:  allergic reactions like skin rash, itching or hives, swelling of the face, lips, or tongue  breathing problems  confusion  feeling faint or lightheaded, falls  fever or chills, sore throat  joint pain  muscles pain, spasms  problems with balance, talking, walking  restlessness or need to keep moving  seizures  signs and symptoms of high blood sugar such as being more thirsty or hungry or having to urinate more than normal. You may also feel very tired or have blurry vision.  suicidal thoughts or other mood changes  trouble  swallowing  uncontrollable and excessive urges (examples: gambling, binge eating, shopping, having sex)  uncontrollable head, mouth, neck, arm, or leg movements  unusually weak or tired Side effects that usually do not require medical attention (report to your doctor or health care professional if they continue or are bothersome):  constipation  headache  nausea, vomiting  trouble sleeping  weight gain This list may not describe all possible side effects. Call your doctor for medical advice about side effects. You may report side effects to FDA at 1-800-FDA-1088. Where should I keep my medicine? Keep out of the reach of children. Store at room temperature between 15 and 30 degrees C (59 and 86 degrees F). Throw away any unused medicine after the expiration date. NOTE: This sheet is a summary. It may not cover all possible information. If you have questions about this medicine, talk to your doctor, pharmacist, or health care provider.  2020 Elsevier/Gold Standard (2019-01-30 11:09:04)

## 2019-10-29 NOTE — Progress Notes (Signed)
error 

## 2019-11-07 ENCOUNTER — Encounter: Payer: Self-pay | Admitting: Family Medicine

## 2019-11-07 ENCOUNTER — Ambulatory Visit (INDEPENDENT_AMBULATORY_CARE_PROVIDER_SITE_OTHER): Payer: BC Managed Care – PPO | Admitting: Family Medicine

## 2019-11-07 ENCOUNTER — Other Ambulatory Visit: Payer: Self-pay

## 2019-11-07 VITALS — BP 141/85 | HR 91 | Temp 99.7°F | Wt 203.0 lb

## 2019-11-07 DIAGNOSIS — G4710 Hypersomnia, unspecified: Secondary | ICD-10-CM

## 2019-11-07 DIAGNOSIS — R0602 Shortness of breath: Secondary | ICD-10-CM

## 2019-11-07 DIAGNOSIS — G4730 Sleep apnea, unspecified: Secondary | ICD-10-CM

## 2019-11-07 DIAGNOSIS — F331 Major depressive disorder, recurrent, moderate: Secondary | ICD-10-CM | POA: Diagnosis not present

## 2019-11-07 MED ORDER — MODAFINIL 200 MG PO TABS: 200.0000 mg | ORAL_TABLET | Freq: Every day | ORAL | 3 refills | Status: DC

## 2019-11-07 NOTE — Progress Notes (Addendum)
Name: Teresa Gallagher   MRN: [id number]    DOB: Nov 01, 1961   Date:11/07/2019       Progress Note  Subjective  Chief Complaint  Chief Complaint  Patient presents with  . Medication Refill    2 month F/U  . Depression    Still having constipation but taking a stool softner and that is helping her symptoms  . GAD    I connected with  TAMEEKA RUTHERFORD  on 11/07/19 at 10:40 AM EST by a video enabled telemedicine application and verified that I am speaking with the correct person using two identifiers.  I discussed the limitations of evaluation and management by telemedicine and the availability of in person appointments. The patient expressed understanding and agreed to proceed. Staff also discussed with the patient that there may be a patient responsible charge related to this service. Patient Location: at home  Provider Location: [facility]   HPI  Sleep Apnea: she was diagnosed with OSA in 2016 , she has been compliant with CPAP machine, she states she keeps it on for at least 4 hours per night.  She denies morning headaches, she states Provigil used to help stay awake, but she has been waking up gasping lately, we will send her for another titration study. Fatigue is worse. She would like to go back to the place she had last study done  SOB/wheezing: she states that she has SOB with moderate activity ( but doing better now ) , she also used to have episodes of  wheezing when laughing - but that has resolved alsoshe has seen pulmonologist and cardiologist. Still has edema on legs intermittently and takes lasix and potassium prn. Seen by Dr. Rockey Situ in the past She is doing better, able to walk around campus with a friend recently.   GAD/mild recurrent depression: she saw Dr. Shea Evans for the first time Nov 10 th, 2020. She is still on Duloxetine 40caps, and Abilify 2 mg was added, she was also instructed to see a counselor and is going to make an appointment . Still under a  lot of stress and changes at work with remote learning. 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 She is not sleeping well at night. She has been struggling with establishing a routine and feels tired all the time  Obesity:she was down to 206 lbs at home, but weight was  up to 211 lbs and is down now to 203 lbs , she has morbid obesity based on BMI above 35 , knee pain and OSA, also has OA. She states too busy at work and feels overwhelmed, but eating more salads, when her son is at home she cooks other meals.   Patient Active Problem List   Diagnosis Date Noted  . Alcohol use disorder, mild, abuse 10/29/2019  . Old tear of meniscus of right knee 03/20/2019  . SOB (shortness of breath) 08/29/2017  . Episodic tension-type headache, not intractable 12/20/2016  . Rib pain on right side 11/27/2015  . History of blood transfusion 07/30/2015  . GAD (generalized anxiety disorder) 07/30/2015  . Chronic constipation 07/13/2015  . Insomnia 07/13/2015  . MDD (major depressive disorder), recurrent episode, mild ([place]) 07/13/2015  . Fibromyalgia syndrome 07/13/2015  . 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 Morbid obesity ([place]) 07/13/2015  . Hypersomnia with sleep apnea 07/13/2015  . Vitamin D deficiency 07/13/2015  Past Surgical History:  Procedure Laterality Date  . BUNIONECTOMY Right 11/07/2014   Dr. Hulda Humphrey at [facility]   . CARPAL TUNNEL RELEASE Left 2012  . CESAREAN SECTION    . LAPAROSCOPIC HYSTERECTOMY     followed by 3 cuff repairs  . LIPOMA EXCISION  2012  . REFRACTIVE SURGERY Bilateral   . TONSILLECTOMY      Family History  Problem Relation Age of Onset  . Heart attack Father   . Alcohol abuse Father   . Bipolar disorder Brother   . Depression Brother   . Bipolar disorder Brother   . Depression Brother   . Alcohol abuse Brother   . Heart attack Paternal Uncle   . COPD Sister        end stages     Social History   Socioeconomic History  . Marital status: Divorced    Spouse name: larry  . Number of children: 3  . Years of education: Not on file  . Highest education level: Bachelor's degree (e.g., BA, AB, BS)  Occupational History  . Not on file  Social Needs  . Financial resource strain: Somewhat hard  . Food insecurity    Worry: Sometimes true    Inability: Sometimes true  . Transportation needs    Medical: No    Non-medical: No  Tobacco Use  . Smoking status: Never Smoker  . Smokeless tobacco: Never Used  Substance and Sexual Activity  . Alcohol use: Yes    Alcohol/week: 0.0 standard drinks    Comment: a bottle of wine a week. same with liquor  . Drug use: No  . Sexual activity: Not Currently  Lifestyle  . Physical activity    Days per week: 0 days    Minutes per session: 0 min  . Stress: Rather much  Relationships  . Social connections    Talks on phone: More than three times a week    Gets together: Never    Attends religious service: More than 4 times per year    Active member of club or organization: No    Attends meetings of clubs or organizations: Never    Relationship status: Divorced  . Intimate partner violence    Fear of current or ex partner: No    Emotionally abused: No    Physically abused: No    Forced sexual activity: No  Other Topics Concern  . Not on file  Social History Narrative   Works at 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         Current Outpatient Medications:  .  ALPRAZolam (XANAX) 0.5 MG tablet, Take 0.5 tablets (0.25 mg total) by mouth daily as needed for anxiety., Disp: 10 tablet, Rfl: 0 .  ARIPiprazole (ABILIFY) 2 MG tablet, Take 1 tablet (2 mg total) by mouth daily., Disp: 30 tablet, Rfl: 1 .  Ascorbic Acid (VITAMIN C PO), Take 1 tablet by mouth daily., Disp: , Rfl:  .  diclofenac (VOLTAREN) 50 MG EC tablet, Take 50 mg by mouth as needed for mild pain. , Disp: , Rfl:  .  DULoxetine (CYMBALTA) 20 MG capsule,  TAKE 2 CAPSULES BY MOUTH EVERY DAY, Disp: 60 capsule, Rfl: 0 .  furosemide (LASIX) 20 MG tablet, TAKE 1 TABLET (20 MG TOTAL) BY MOUTH AS NEEDED FOR FLUID OR EDEMA., Disp: 90 tablet, Rfl: 0 .  hydrOXYzine (ATARAX/VISTARIL) 10 MG tablet, Take 1 tablet (10 mg total) by mouth 3 (three) times daily as needed., Disp: 30 tablet,  Rfl: 0 .  KLOR-CON M20 20 MEQ tablet, TAKE 1 TABLET (20 MEQ TOTAL) BY MOUTH DAILY AS NEEDED (TAKE WITH LASIX)., Disp: 90 tablet, Rfl: 0 .  modafinil (PROVIGIL) 200 MG tablet, Take 1 tablet (200 mg total) by mouth daily., Disp: 30 tablet, Rfl: 3 .  Multiple Vitamins-Minerals (ZINC PO), Take 1 tablet by mouth daily., Disp: , Rfl:  .  Probiotic Product (PROBIOTIC PO), Take 1 tablet by mouth daily., Disp: , Rfl:  .  traMADol (ULTRAM) 50 MG tablet, Take 50 mg by mouth every 6 (six) hours as needed., Disp: , Rfl:  .  valACYclovir (VALTREX) 500 MG tablet, Take 1 tablet (500 mg total) by mouth daily. And twice daily for outbreaks, Disp: 35 tablet, Rfl: 12 .  Vitamin D, Ergocalciferol, (DRISDOL) 1.25 MG (50000 UT) CAPS capsule, TAKE 1 CAPSULE (50,000 UNITS TOTAL) BY MOUTH EVERY 7 (SEVEN) DAYS., Disp: 12 capsule, Rfl: 0  No Known Allergies  I personally reviewed active problem list, medication list, allergies, family history, social history with the patient/caregiver today.   ROS  Ten systems reviewed and is negative except as mentioned in HPI   Objective  Virtual encounter, weight checked at home  Today's Vitals   11/07/19 1521  BP: (!) 141/85  Pulse: 91  Temp: 99.7 F (37.6 C)  SpO2: 98%  Weight: 203 lb (92.1 kg)   Body mass index is 35.4 kg/m.   Body mass index is 35.4 kg/m.  Physical Exam    Awake, alert and oriented  PHQ2/9: Depression screen [facility] 2/9 11/07/2019 09/09/2019 08/02/2019 04/22/2019 03/19/2019  Decreased Interest 1 1 3 1 2   Down, Depressed, Hopeless 3 1 3  0 2  PHQ - 2 Score 4 2 6 1 4   Altered sleeping 1 3 2  0 0  Tired, decreased energy 1 1 3 1 2    Change in appetite 1 1 1  0 1  Feeling bad or failure about yourself  0 0 2 0 0  Trouble concentrating 3 3 3 1  0  Moving slowly or fidgety/restless 0 1 2 1 1   Suicidal thoughts 0 0 0 0 0  PHQ-9 Score 10 11 19 4 8   Difficult doing work/chores Very difficult Somewhat difficult Extremely dIfficult Somewhat difficult Somewhat difficult  Some recent data might be hidden   PHQ-2/9 Result is positive.    GAD 7 : Generalized Anxiety Score 11/07/2019 09/09/2019 04/22/2019 03/19/2019  Nervous, Anxious, on Edge 1 1 1 1   Control/stop worrying 1 1 0 1  Worry too much - different things 3 1 1 1   Trouble relaxing 3 1 1  0  Restless 0 1 0 0  Easily annoyed or irritable 1 0 0 0  Afraid - awful might happen 3 1 0 1  Total GAD 7 Score 12 6 3 4   Anxiety Difficulty Very difficult Somewhat difficult Somewhat difficult Somewhat difficult     Fall Risk: Fall Risk  11/07/2019 09/09/2019 08/02/2019 04/22/2019 03/19/2019  Falls in the past year? 1 0 0 0 0  Number falls in past yr: 0 0 0 0 0  Injury with Fall? 1 0 0 0 0  Comment Injury her ligament in her left hand - - - -     Assessment & Plan  1. Sleep apnea with hypersomnolence  - modafinil (PROVIGIL) 200 MG tablet; Take 1 tablet (200 mg total) by mouth daily.  Dispense: 30 tablet; Refill: 3  2. Morbid obesity ([place])  Discussed with the patient the risk posed by an increased  BMI. Discussed importance of portion control, calorie counting and at least 150 minutes of physical activity weekly. Avoid sweet beverages and drink more water. Eat at least 6 servings of fruit and vegetables daily   3. SOB (shortness of breath)  Doing better, able to walk without sob  4. Moderate episode of recurrent major depressive disorder ([place])  Following up with Dr. Shea Evans   I discussed the assessment and treatment plan with the patient. The patient was provided an opportunity to ask questions and all were answered. The patient agreed with the plan and demonstrated an  understanding of the instructions.  The patient was advised to call back or seek an in-person evaluation if the symptoms worsen or if the condition fails to improve as anticipated.  I provided 25  minutes of non-face-to-face time during this encounter.

## 2019-11-21 ENCOUNTER — Ambulatory Visit (INDEPENDENT_AMBULATORY_CARE_PROVIDER_SITE_OTHER): Payer: BC Managed Care – PPO | Admitting: Psychiatry

## 2019-11-21 ENCOUNTER — Other Ambulatory Visit: Payer: Self-pay

## 2019-11-21 ENCOUNTER — Other Ambulatory Visit: Payer: Self-pay | Admitting: Psychiatry

## 2019-11-21 ENCOUNTER — Encounter: Payer: Self-pay | Admitting: Psychiatry

## 2019-11-21 DIAGNOSIS — F330 Major depressive disorder, recurrent, mild: Secondary | ICD-10-CM

## 2019-11-21 DIAGNOSIS — F1011 Alcohol abuse, in remission: Secondary | ICD-10-CM

## 2019-11-21 DIAGNOSIS — F411 Generalized anxiety disorder: Secondary | ICD-10-CM | POA: Diagnosis not present

## 2019-11-21 MED ORDER — DULOXETINE HCL 60 MG PO CPEP: 60.0000 mg | ORAL_CAPSULE | Freq: Every day | ORAL | 1 refills | Status: DC

## 2019-11-21 MED ORDER — TRAZODONE HCL 50 MG PO TABS: 25.0000 mg | ORAL_TABLET | Freq: Every evening | ORAL | 1 refills | Status: DC | PRN

## 2019-11-21 NOTE — Progress Notes (Signed)
Virtual Visit via Video Note  I connected with Teresa Gallagher on 11/21/19 at  1:40 PM EST by a video enabled telemedicine application and verified that I am speaking with the correct person using two identifiers.   I discussed the limitations of evaluation and management by telemedicine and the availability of in person appointments. The patient expressed understanding and agreed to proceed.    I discussed the assessment and treatment plan with the patient. The patient was provided an opportunity to ask questions and all were answered. The patient agreed with the plan and demonstrated an understanding of the instructions.   The patient was advised to call back or seek an in-person evaluation if the symptoms worsen or if the condition fails to improve as anticipated.   [facility] MD OP Progress Note  11/21/2019 3:08 PM [name]  MRN:  [id number]  Chief Complaint:  Chief Complaint    Follow-up     HPI: Teresa Gallagher is a 58 year old African-American female, employed, divorced, lives in [place], has a history of depression, anxiety, alcohol use disorder, mild abuse, sleep apnea, fibromyalgia, vitamin D deficiency was evaluated by telemedicine today.  Patient today reports she is currently compliant on Cymbalta and Abilify.  She has not noticed much benefit from the combination of medication at the recent changes.  She however reports her depressive symptoms are not getting worse.  Prior to starting the Abilify she was going downhill.  She reports she continues to struggle with sleep.  She has been taking Tylenol PM as well as melatonin which helps to some extent.  She reports she is currently cutting back on using alcohol and currently has 1 drink daily.  She is currently working on cutting back.  Patient reports she has several psychosocial stressors.  She works as Public relations account executive and reports due to the pandemic she is worried about going in person teaching as well as bringing the kids  in for testing.  She also reports she is responsible for a lot of duties and that also is making her overwhelmed.  Patient denies any suicidality, homicidality or perceptual disturbances.  Patient denies any other concerns today.   Visit Diagnosis: R/O Bipolar disorder   ICD-10-CM   1. MDD (major depressive disorder), recurrent episode, mild (HCC)  F33.0 traZODone (DESYREL) 50 MG tablet    DULoxetine (CYMBALTA) 60 MG capsule  2. GAD (generalized anxiety disorder)  F41.1 DULoxetine (CYMBALTA) 60 MG capsule  3. Alcohol use disorder, mild, in early remission  F10.11     Past Psychiatric History: Reviewed past psychiatric history from a progress note on 10/29/2019.  Past trials of Cymbalta, Xanax.  Past Medical History:  Past Medical History:  Diagnosis Date  . Acne   . Arrhythmia   . Baker's cyst, right 10/2018   calf  . Blood transfusion without reported diagnosis   . Bunion   . Cervicalgia   . Chronic kidney disease    Kidney Stents  . Depression   . Herpes simplex without mention of complication   . Mastodynia   . Myalgia and myositis, unspecified   . Other acne   . Other malaise and fatigue   . Painful respiration   . Palpitations   . Seborrhea capitis   . Sleep apnea in adult   . Symptomatic menopausal or female climacteric states   . Unspecified constipation   . Unspecified sleep apnea   . Unspecified vitamin D deficiency   . Vitamin D deficiency  Past Surgical History:  Procedure Laterality Date  . BUNIONECTOMY Right 11/07/2014   [facility]   . CARPAL TUNNEL RELEASE Left 2012  . CESAREAN SECTION    . LAPAROSCOPIC HYSTERECTOMY     followed by 3 cuff repairs  . LIPOMA EXCISION  2012  . REFRACTIVE SURGERY Bilateral   . TONSILLECTOMY      Family Psychiatric History: Reviewed family psychiatric history from my progress note on 10/29/2019.  Family History:  Family History  Problem Relation Age of Onset  . Heart attack Father   .  Alcohol abuse Father   . Bipolar disorder Brother   . Depression Brother   . Bipolar disorder Brother   . Depression Brother   . Alcohol abuse Brother   . Heart attack Paternal Uncle   . COPD Sister        end stages    Social History: Reviewed social history from my progress note on 10/29/2019. Social History   Socioeconomic History  . Marital status: Divorced    Spouse name: larry  . Number of children: 3  . Years of education: Not on file  . Highest education level: Bachelor's degree (e.g., BA, AB, BS)  Occupational History  . Not on file  Social Needs  . Financial resource strain: Somewhat hard  . Food insecurity    Worry: Sometimes true    Inability: Sometimes true  . Transportation needs    Medical: No    Non-medical: No  Tobacco Use  . Smoking status: Never Smoker  . Smokeless tobacco: Never Used  Substance and Sexual Activity  . Alcohol use: Yes    Alcohol/week: 0.0 standard drinks    Comment: a bottle of wine a week. same with liquor  . Drug use: No  . Sexual activity: Not Currently  Lifestyle  . Physical activity    Days per week: 0 days    Minutes per session: 0 min  . Stress: Rather much  Relationships  . Social connections    Talks on phone: More than three times a week    Gets together: Never    Attends religious service: More than 4 times per year    Active member of club or organization: No    Attends meetings of clubs or organizations: Never    Relationship status: Divorced  Other Topics Concern  . Not on file  Social History Narrative   Works at 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        Allergies: No Known Allergies  Metabolic Disorder Labs: Lab Results  Component Value Date   HGBA1C 5.4 08/02/2019   MPG 108 08/02/2019   MPG 114 09/18/2018   No results found for: PROLACTIN Lab Results  Component Value Date   CHOL 193 08/02/2019   TRIG 80 08/02/2019   HDL 60 08/02/2019   CHOLHDL 3.2 08/02/2019   VLDL 17 06/20/2017    LDLCALC 116 (H) 08/02/2019   LDLCALC 110 (H) 09/18/2018   Lab Results  Component Value Date   TSH 1.42 08/02/2019   TSH 2.03 09/18/2018    Therapeutic Level Labs: No results found for: LITHIUM No results found for: VALPROATE No components found for:  CBMZ  Current Medications: Current Outpatient Medications  Medication Sig Dispense Refill  . ALPRAZolam (XANAX) 0.5 MG tablet Take 0.5 tablets (0.25 mg total) by mouth daily as needed for anxiety. 10 tablet 0  . ARIPiprazole (ABILIFY) 2 MG tablet Take 1 tablet (2 mg total)  by mouth daily. 30 tablet 1  . Ascorbic Acid (VITAMIN C PO) Take 1 tablet by mouth daily.    . diclofenac (VOLTAREN) 50 MG EC tablet Take 50 mg by mouth as needed for mild pain.     . DULoxetine (CYMBALTA) 60 MG capsule Take 1 capsule (60 mg total) by mouth daily. 30 capsule 1  . furosemide (LASIX) 20 MG tablet TAKE 1 TABLET (20 MG TOTAL) BY MOUTH AS NEEDED FOR FLUID OR EDEMA. 90 tablet 0  . hydrOXYzine (ATARAX/VISTARIL) 10 MG tablet Take 1 tablet (10 mg total) by mouth 3 (three) times daily as needed. 30 tablet 0  . KLOR-CON M20 20 MEQ tablet TAKE 1 TABLET (20 MEQ TOTAL) BY MOUTH DAILY AS NEEDED (TAKE WITH LASIX). 90 tablet 0  . modafinil (PROVIGIL) 200 MG tablet Take 1 tablet (200 mg total) by mouth daily. 30 tablet 3  . Multiple Vitamins-Minerals (ZINC PO) Take 1 tablet by mouth daily.    . Probiotic Product (PROBIOTIC PO) Take 1 tablet by mouth daily.    . traMADol (ULTRAM) 50 MG tablet Take 50 mg by mouth every 6 (six) hours as needed.    . traZODone (DESYREL) 50 MG tablet Take 0.5-1 tablets (25-50 mg total) by mouth at bedtime as needed for sleep. 30 tablet 1  . valACYclovir (VALTREX) 500 MG tablet Take 1 tablet (500 mg total) by mouth daily. And twice daily for outbreaks 35 tablet 12  . Vitamin D, Ergocalciferol, (DRISDOL) 1.25 MG (50000 UT) CAPS capsule TAKE 1 CAPSULE (50,000 UNITS TOTAL) BY MOUTH EVERY 7 (SEVEN) DAYS. 12 capsule 0   No current  facility-administered medications for this visit.      Musculoskeletal: Strength & Muscle Tone: UTA Gait & Station: normal Patient leans: N/A  Psychiatric Specialty Exam: Review of Systems  Psychiatric/Behavioral: Positive for depression. The patient is nervous/anxious and has insomnia.   All other systems reviewed and are negative.   There were no vitals taken for this visit.There is no height or weight on file to calculate BMI.  General Appearance: Casual  Eye Contact:  Fair  Speech:  Normal Rate  Volume:  Normal  Mood:  Anxious and Depressed  Affect:  Congruent  Thought Process:  Goal Directed and Descriptions of Associations: Intact  Orientation:  Full (Time, Place, and Person)  Thought Content: Logical   Suicidal Thoughts:  No  Homicidal Thoughts:  No  Memory:  Immediate;   Fair Recent;   Fair Remote;   Fair  Judgement:  Fair  Insight:  Fair  Psychomotor Activity:  Normal  Concentration:  Concentration: Fair and Attention Span: Fair  Recall:  [employer] of Knowledge: Fair  Language: Fair  Akathisia:  No  Handed:  Right  AIMS (if indicated): Denies stiffness, rigidity  Assets:  Communication Skills Desire for [place] Talents/Skills Transportation Vocational/Educational  ADL's:  Intact  Cognition: WNL  Sleep:  Poor   Screenings: GAD-7     Office Visit from 11/07/2019 in [facility] Office Visit from 09/09/2019 in [facility] Office Visit from 04/22/2019 in [facility] Office Visit from 03/20/2019 in [facility] Office Visit from 09/18/2018 in [facility]  Total GAD-7 Score  12  6  3  4  3     PHQ2-9     Office Visit from 11/07/2019 in [facility] Office Visit from 09/09/2019 in [facility] Office Visit from 08/02/2019 in  [facility] Office Visit from 04/22/2019 in [facility] Office Visit from 03/20/2019 in [facility]  PHQ-2 Total Score  4  2  6  1  4   PHQ-9 Total Score  10  11  19  4  8        Assessment and Plan: Teresa Gallagher is a 58 year old African-American female who is divorced, employed, lives in [place], has a history of anxiety, depression, sleep apnea, fibromyalgia was evaluated by telemedicine today.  She is biologically predisposed given her family history as well as history of trauma.  Patient with psychosocial stressors of her own health issues, current pandemic.  Patient continues to struggle with depression as well as sleep problems and will benefit from medication readjustment.  Plan as noted below.  Plan MDD-unstable Increase Cymbalta to 60 mg p.o. daily Abilify 2 mg p.o. daily Start trazodone 25 to 50 mg p.o. nightly as needed for sleep   GAD-unstable Patient is no longer on Xanax. Increase Cymbalta to 60 mg p.o. daily Patient was referred for CBT-she reports she will start seeing a therapist at [facility] center.  Alcohol use disorder mild-in early remission Provided substance abuse counseling.   Follow-up in clinic in 4 weeks or sooner if needed.  January 5 at 4:40 PM  I have spent atleast 15 minutes non face to face with patient today. More than 50 % of the time was spent for psychoeducation and supportive psychotherapy and care coordination. This note was generated in part or whole with voice recognition software. Voice recognition is usually quite accurate but there are transcription errors that can and very often do occur. I apologize for any typographical errors that were not detected and corrected.      Ursula Alert, MD 11/21/2019, 3:08 PM

## 2019-12-17 ENCOUNTER — Other Ambulatory Visit: Payer: Self-pay | Admitting: Psychiatry

## 2019-12-17 DIAGNOSIS — F330 Major depressive disorder, recurrent, mild: Secondary | ICD-10-CM

## 2019-12-23 LAB — HM MAMMOGRAPHY

## 2019-12-24 ENCOUNTER — Other Ambulatory Visit: Payer: Self-pay

## 2019-12-24 ENCOUNTER — Ambulatory Visit (INDEPENDENT_AMBULATORY_CARE_PROVIDER_SITE_OTHER): Payer: BC Managed Care – PPO | Admitting: Psychiatry

## 2019-12-24 ENCOUNTER — Encounter: Payer: Self-pay | Admitting: Psychiatry

## 2019-12-24 DIAGNOSIS — F1011 Alcohol abuse, in remission: Secondary | ICD-10-CM | POA: Diagnosis not present

## 2019-12-24 DIAGNOSIS — F330 Major depressive disorder, recurrent, mild: Secondary | ICD-10-CM | POA: Diagnosis not present

## 2019-12-24 DIAGNOSIS — F411 Generalized anxiety disorder: Secondary | ICD-10-CM | POA: Diagnosis not present

## 2019-12-24 MED ORDER — TRAZODONE HCL 50 MG PO TABS: 75.0000 mg | ORAL_TABLET | Freq: Every evening | ORAL | 1 refills | Status: DC | PRN

## 2019-12-24 NOTE — Progress Notes (Signed)
Virtual Visit via Video Note  I connected with Teresa Gallagher on 12/24/19 at  4:40 PM EST by a video enabled telemedicine application and verified that I am speaking with the correct person using two identifiers.   I discussed the limitations of evaluation and management by telemedicine and the availability of in person appointments. The patient expressed understanding and agreed to proceed.      I discussed the assessment and treatment plan with the patient. The patient was provided an opportunity to ask questions and all were answered. The patient agreed with the plan and demonstrated an understanding of the instructions.   The patient was advised to call back or seek an in-person evaluation if the symptoms worsen or if the condition fails to improve as anticipated.   [place] MD OP Progress Note  12/24/2019 5:05 PM Teresa Gallagher  MRN:  [id number]  Chief Complaint:  Chief Complaint    Follow-up     HPI: Teresa Gallagher is a 59 year old African-American female, employed, divorced, lives in [place], has a history of depression, anxiety, alcohol use disorder, sleep apnea, fibromyalgia, vitamin D deficiency was evaluated by telemedicine today.  Patient today reports she has noticed some benefit from the Cymbalta and Abilify combination.  She however reports she continues to feel overwhelmed and anxious often.  She reports she also feels her concentration has been affected.  She reports she however does not want to make any more changes with the Cymbalta dosage since it is helpful, she wants to give it more time.  She reports trazodone does help her with her sleep however she continues to combine it with melatonin or Benadryl.  Discussed with patient that her trazodone dosage can be readjusted that patient does not have to make use of her other medications that she takes for sleep.  Patient reports she has not been able to schedule appointment with therapist yet.  She however agrees to call [facility]  counseling center.  Patient reports she is cutting back on alcohol and currently has 1-2 drinks daily.  Patient denies any suicidality, homicidality or perceptual disturbances.  Patient denies any other concerns today.  Visit Diagnosis: R/O Bipolar disorder   ICD-10-CM   1. MDD (major depressive disorder), recurrent episode, mild (HCC)  F33.0 traZODone (DESYREL) 50 MG tablet  2. GAD (generalized anxiety disorder)  F41.1   3. Alcohol use disorder, mild, in early remission  F10.11     Past Psychiatric History: I have reviewed past psychiatric history from my progress note on 10/29/2019.  Past trials of Cymbalta, Xanax.  Past Medical History:  Past Medical History:  Diagnosis Date  . Acne   . Arrhythmia   . Baker's cyst, right 10/2018   calf  . Blood transfusion without reported diagnosis   . Bunion   . Cervicalgia   . Chronic kidney disease    Kidney Stents  . Depression   . Herpes simplex without mention of complication   . Mastodynia   . Myalgia and myositis, unspecified   . Other acne   . Other malaise and fatigue   . Painful respiration   . Palpitations   . Seborrhea capitis   . Sleep apnea in adult   . Symptomatic menopausal or female climacteric states   . Unspecified constipation   . Unspecified sleep apnea   . Unspecified vitamin D deficiency   . Vitamin D deficiency     Past Surgical History:  Procedure Laterality Date  . BUNIONECTOMY Right 11/07/2014   Dr.  Hearn at [facility]   . CARPAL TUNNEL RELEASE Left 2012  . CESAREAN SECTION    . LAPAROSCOPIC HYSTERECTOMY     followed by 3 cuff repairs  . LIPOMA EXCISION  2012  . REFRACTIVE SURGERY Bilateral   . TONSILLECTOMY      Family Psychiatric History: I have reviewed family psychiatric history from my progress note on 10/29/2019.  Family History:  Family History  Problem Relation Age of Onset  . Heart attack Father   . Alcohol abuse Father   . Bipolar disorder Brother   . Depression Brother    . Bipolar disorder Brother   . Depression Brother   . Alcohol abuse Brother   . Heart attack Paternal Uncle   . COPD Sister        end stages    Social History: I have reviewed social history from my progress note on 10/29/2019. Social History   Socioeconomic History  . Marital status: Divorced    Spouse name: larry  . Number of children: 3  . Years of education: Not on file  . Highest education level: Bachelor's degree (e.g., BA, AB, BS)  Occupational History  . Not on file  Tobacco Use  . Smoking status: Never Smoker  . Smokeless tobacco: Never Used  Substance and Sexual Activity  . Alcohol use: Yes    Alcohol/week: 0.0 standard drinks    Comment: a bottle of wine a week. same with liquor  . Drug use: No  . Sexual activity: Not Currently  Other Topics Concern  . Not on file  Social History Narrative   Works at 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       Social Determinants of Health   Financial Resource Strain:   . Difficulty of Paying Living Expenses: Not on file  Food Insecurity:   . Worried About Charity fundraiser in the Last Year: Not on file  . Ran Out of Food in the Last Year: Not on file  Transportation Needs:   . Lack of Transportation (Medical): Not on file  . Lack of Transportation (Non-Medical): Not on file  Physical Activity:   . Days of Exercise per Week: Not on file  . Minutes of Exercise per Session: Not on file  Stress:   . Feeling of Stress : Not on file  Social Connections: Unknown  . Frequency of Communication with Friends and Family: Not on file  . Frequency of Social Gatherings with Friends and Family: Never  . Attends Religious Services: Not on file  . Active Member of Clubs or Organizations: Not on file  . Attends Archivist Meetings: Not on file  . Marital Status: Not on file    Allergies: No Known Allergies  Metabolic Disorder Labs: Lab Results  Component Value Date   HGBA1C 5.4 08/02/2019   MPG 108  08/02/2019   MPG 114 09/18/2018   No results found for: PROLACTIN Lab Results  Component Value Date   CHOL 193 08/02/2019   TRIG 80 08/02/2019   HDL 60 08/02/2019   CHOLHDL 3.2 08/02/2019   VLDL 17 06/20/2017   LDLCALC 116 (H) 08/02/2019   LDLCALC 110 (H) 09/18/2018   Lab Results  Component Value Date   TSH 1.42 08/02/2019   TSH 2.03 09/18/2018    Therapeutic Level Labs: No results found for: LITHIUM No results found for: VALPROATE No components found for:  CBMZ  Current Medications: Current Outpatient Medications  Medication Sig Dispense Refill  .  ALPRAZolam (XANAX) 0.5 MG tablet Take 0.5 tablets (0.25 mg total) by mouth daily as needed for anxiety. 10 tablet 0  . ARIPiprazole (ABILIFY) 2 MG tablet TAKE 1 TABLET BY MOUTH EVERY DAY 90 tablet 1  . Ascorbic Acid (VITAMIN C PO) Take 1 tablet by mouth daily.    . diclofenac (VOLTAREN) 50 MG EC tablet Take 50 mg by mouth as needed for mild pain.     . DULoxetine (CYMBALTA) 60 MG capsule Take 1 capsule (60 mg total) by mouth daily. 30 capsule 1  . furosemide (LASIX) 20 MG tablet TAKE 1 TABLET (20 MG TOTAL) BY MOUTH AS NEEDED FOR FLUID OR EDEMA. 90 tablet 0  . hydrOXYzine (ATARAX/VISTARIL) 10 MG tablet Take 1 tablet (10 mg total) by mouth 3 (three) times daily as needed. 30 tablet 0  . KLOR-CON M20 20 MEQ tablet TAKE 1 TABLET (20 MEQ TOTAL) BY MOUTH DAILY AS NEEDED (TAKE WITH LASIX). 90 tablet 0  . modafinil (PROVIGIL) 200 MG tablet Take 1 tablet (200 mg total) by mouth daily. 30 tablet 3  . Multiple Vitamins-Minerals (ZINC PO) Take 1 tablet by mouth daily.    . Probiotic Product (PROBIOTIC PO) Take 1 tablet by mouth daily.    . traMADol (ULTRAM) 50 MG tablet Take 50 mg by mouth every 6 (six) hours as needed.    . traZODone (DESYREL) 50 MG tablet Take 1.5-2 tablets (75-100 mg total) by mouth at bedtime as needed for sleep. Patient has supplies 90 tablet 1  . valACYclovir (VALTREX) 500 MG tablet Take 1 tablet (500 mg total) by mouth  daily. And twice daily for outbreaks 35 tablet 12  . Vitamin D, Ergocalciferol, (DRISDOL) 1.25 MG (50000 UT) CAPS capsule TAKE 1 CAPSULE (50,000 UNITS TOTAL) BY MOUTH EVERY 7 (SEVEN) DAYS. 12 capsule 0   No current facility-administered medications for this visit.     Musculoskeletal: Strength & Muscle Tone: UTA Gait & Station: normal Patient leans: N/A  Psychiatric Specialty Exam: Review of Systems  Psychiatric/Behavioral: Positive for decreased concentration, dysphoric mood and sleep disturbance. The patient is nervous/anxious.   All other systems reviewed and are negative.   There were no vitals taken for this visit.There is no height or weight on file to calculate BMI.  General Appearance: Casual  Eye Contact:  Fair  Speech:  Clear and Coherent  Volume:  Normal  Mood:  Anxious and Depressed  Affect:  Congruent  Thought Process:  Goal Directed and Descriptions of Associations: Intact  Orientation:  Full (Time, Place, and Person)  Thought Content: Logical   Suicidal Thoughts:  No  Homicidal Thoughts:  No  Memory:  Immediate;   Fair Recent;   Fair Remote;   Fair  Judgement:  Fair  Insight:  Fair  Psychomotor Activity:  Normal  Concentration:  Concentration: Fair and Attention Span: Fair  Recall:  [employer] of Knowledge: Fair  Language: Fair  Akathisia:  No  Handed:  Right  AIMS (if indicated): denies tremors, rigidity  Assets:  Communication Skills Desire for [place] Talents/Skills  ADL's:  Intact  Cognition: WNL  Sleep:  Improving   Screenings: GAD-7     Office Visit from 11/07/2019 in [facility] Office Visit from 09/09/2019 in [facility] Office Visit from 04/22/2019 in [facility] Office Visit from 03/20/2019 in [facility] Office Visit from [facility]18/[facility]  Total GAD-7 Score  12  6  3  4  3     PHQ2-9     Office Visit from  11/07/2019 in [facility] Office Visit from 09/09/2019 in [facility] Office Visit from 08/02/2019 in [facility] Office Visit from 04/22/2019 in [facility] Office Visit from 03/20/2019 in [facility]  PHQ-2 Total Score  4  2  6  1  4   PHQ-9 Total Score  10  11  19  4  8        Assessment and Plan: Teresa Gallagher is a 59 year old African-American female who is divorced, employed, lives in [place], has a history of anxiety, depression, fibromyalgia, was evaluated by telemedicine today.  She is biologically predisposed given her family history as well as history of trauma.  Patient with psychosocial stressors of her own health issues, current pandemic is currently making some progress.  She however will benefit from psychotherapy sessions.  Plan as noted below.  Plan MDD-some progress Cymbalta 60 mg p.o. daily Abilify 2 mg p.o. daily Increase trazodone to 75 mg to 100 mg p.o. nightly as needed for sleep   GAD-improving Cymbalta 60 mg p.o. daily Patient advised to start psychotherapy sessions-she reports she is trying to get connected with therapist at [facility] counseling center. Discussed with patient to call the clinic back if she continues to have problem connecting with her therapist.  Alcohol use disorder --mild in early remission Provided substance abuse counseling She is currently cutting back.  Follow-up in clinic in 4 weeks or sooner if needed.  February 1 at 4 PM  I have spent atleast 20 minutes non face to face with patient today. More than 50 % of the time was spent for psychoeducation and supportive psychotherapy and care coordination,as well as documenting clinical information in electronic health record.  This note was generated in part or whole with voice recognition software. Voice recognition is usually quite accurate but there are transcription errors that can and very often do occur. I  apologize for any typographical errors that were not detected and corrected.        Ursula Alert, MD 12/24/2019, 5:05 PM

## 2020-01-20 ENCOUNTER — Other Ambulatory Visit: Payer: Self-pay

## 2020-01-20 ENCOUNTER — Ambulatory Visit (INDEPENDENT_AMBULATORY_CARE_PROVIDER_SITE_OTHER): Payer: BC Managed Care – PPO | Admitting: Psychiatry

## 2020-01-20 ENCOUNTER — Encounter: Payer: Self-pay | Admitting: Psychiatry

## 2020-01-20 DIAGNOSIS — F1011 Alcohol abuse, in remission: Secondary | ICD-10-CM

## 2020-01-20 DIAGNOSIS — F330 Major depressive disorder, recurrent, mild: Secondary | ICD-10-CM | POA: Diagnosis not present

## 2020-01-20 DIAGNOSIS — R41840 Attention and concentration deficit: Secondary | ICD-10-CM

## 2020-01-20 DIAGNOSIS — F411 Generalized anxiety disorder: Secondary | ICD-10-CM | POA: Diagnosis not present

## 2020-01-20 NOTE — Progress Notes (Signed)
Provider Location : [facility] Patient Location : Home   Virtual Visit via Video Note  I connected with Teresa Gallagher on 01/20/20 at  4:00 PM EST by a video enabled telemedicine application and verified that I am speaking with the correct person using two identifiers.   I discussed the limitations of evaluation and management by telemedicine and the availability of in person appointments. The patient expressed understanding and agreed to proceed.    I discussed the assessment and treatment plan with the patient. The patient was provided an opportunity to ask questions and all were answered. The patient agreed with the plan and demonstrated an understanding of the instructions.   The patient was advised to call back or seek an in-person evaluation if the symptoms worsen or if the condition fails to improve as anticipated.   BH MD/PA/NP OP Progress Note  01/20/2020 5:00 PM Teresa Gallagher  MRN:  [id number]  Chief Complaint:  Chief Complaint    Follow-up     HPI: Teresa Gallagher is a 59 year old African-American female, employed, divorced, lives in [place], has a history of depression, anxiety, alcohol use disorder, sleep apnea, fibromyalgia, vitamin D deficiency was evaluated by telemedicine today.  Patient reports her mood symptoms have improved.  She does not feel as depressed as before.  She does have some situational anxiety due to not being able to get things done.  However reports her anxiety symptoms are also more under control.  She reports sleep is good on the trazodone.  Patient however today reports she has been struggling with possible attention, procrastination as well as forgetfulness.  This has been going on since she can remember as well as is getting worse since the past few years.  Patient reports she has trouble wrapping up final details of a project often, getting things in order when she has to organize, remembering appointments, for example keeping up with renewing her  vehicle tags, filing her tax returns and so on.  She also makes careless mistakes at work on and off, has difficulty keeping her attention when she is doing repetitive work, she reports she has difficulty concentrating on what people say to her even when they speak to her directly, she often misplaces things and has difficulty finding things, she is very easily distracted by noise around her, she is often impulsive and interrupts people while they talk.  Patient reports the symptoms are getting worse since the past several months.  She has never been diagnosed with ADHD before.  She however reports she remembers struggling as a teenager as well as an  young adult however she always tried to compensate for her problems and was able to manage  until few years ago.  Patient reports she has been unable to schedule an appointment with therapist at [facility] counseling center due to procrastination.  She however reports she will call them as soon as possible. Visit Diagnosis: R/O ADHD    ICD-10-CM   1. MDD (major depressive disorder), recurrent episode, mild ([place])  F33.0   2. GAD (generalized anxiety disorder)  F41.1   3. Alcohol use disorder, mild, in early remission  F10.11   4. Attention and concentration deficit  R41.840     Past Psychiatric History: I have reviewed past psychiatric history from my progress note on 10/29/2019.  Past trials of Cymbalta, Xanax  Past Medical History:  Past Medical History:  Diagnosis Date  . Acne   . Arrhythmia   . Baker's cyst, right 10/2018  calf  . Blood transfusion without reported diagnosis   . Bunion   . Cervicalgia   . Chronic kidney disease    Kidney Stents  . Depression   . Herpes simplex without mention of complication   . Mastodynia   . Myalgia and myositis, unspecified   . Other acne   . Other malaise and fatigue   . Painful respiration   . Palpitations   . Seborrhea capitis   . Sleep apnea in adult   . Symptomatic menopausal or female  climacteric states   . Unspecified constipation   . Unspecified sleep apnea   . Unspecified vitamin D deficiency   . Vitamin D deficiency     Past Surgical History:  Procedure Laterality Date  . BUNIONECTOMY Right 11/07/2014   Dr. Hulda Humphrey at [facility]   . CARPAL TUNNEL RELEASE Left 2012  . CESAREAN SECTION    . LAPAROSCOPIC HYSTERECTOMY     followed by 3 cuff repairs  . LIPOMA EXCISION  2012  . REFRACTIVE SURGERY Bilateral   . TONSILLECTOMY      Family Psychiatric History: Reviewed family psychiatric history from my progress note on 10/29/2019 Family History:  Family History  Problem Relation Age of Onset  . Heart attack Father   . Alcohol abuse Father   . Bipolar disorder Brother   . Depression Brother   . Bipolar disorder Brother   . Depression Brother   . Alcohol abuse Brother   . Heart attack Paternal Uncle   . COPD Sister        end stages    Social History: Reviewed social history from my progress note on 10/29/2019 Social History   Socioeconomic History  . Marital status: Divorced    Spouse name: larry  . Number of children: 3  . Years of education: Not on file  . Highest education level: Bachelor's degree (e.g., BA, AB, BS)  Occupational History  . Not on file  Tobacco Use  . Smoking status: Never Smoker  . Smokeless tobacco: Never Used  Substance and Sexual Activity  . Alcohol use: Yes    Alcohol/week: 0.0 standard drinks    Comment: a bottle of wine a week. same with liquor  . Drug use: No  . Sexual activity: Not Currently  Other Topics Concern  . Not on file  Social History Narrative   Works at 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       Social Determinants of Health   Financial Resource Strain:   . Difficulty of Paying Living Expenses: Not on file  Food Insecurity:   . Worried About Charity fundraiser in the Last Year: Not on file  . Ran Out of Food in the Last Year: Not on file  Transportation Needs:   . Lack of  Transportation (Medical): Not on file  . Lack of Transportation (Non-Medical): Not on file  Physical Activity:   . Days of Exercise per Week: Not on file  . Minutes of Exercise per Session: Not on file  Stress:   . Feeling of Stress : Not on file  Social Connections: Unknown  . Frequency of Communication with Friends and Family: Not on file  . Frequency of Social Gatherings with Friends and Family: Never  . Attends Religious Services: Not on file  . Active Member of Clubs or Organizations: Not on file  . Attends Archivist Meetings: Not on file  . Marital Status: Not on file    Allergies:  No Known Allergies  Metabolic Disorder Labs: Lab Results  Component Value Date   HGBA1C 5.4 08/02/2019   MPG 108 08/02/2019   MPG 114 09/18/2018   No results found for: PROLACTIN Lab Results  Component Value Date   CHOL 193 08/02/2019   TRIG 80 08/02/2019   HDL 60 08/02/2019   CHOLHDL 3.2 08/02/2019   VLDL 17 06/20/2017   LDLCALC 116 (H) 08/02/2019   LDLCALC 110 (H) 09/18/2018   Lab Results  Component Value Date   TSH 1.42 08/02/2019   TSH 2.03 09/18/2018    Therapeutic Level Labs: No results found for: LITHIUM No results found for: VALPROATE No components found for:  CBMZ  Current Medications: Current Outpatient Medications  Medication Sig Dispense Refill  . ALPRAZolam (XANAX) 0.5 MG tablet Take 0.5 tablets (0.25 mg total) by mouth daily as needed for anxiety. 10 tablet 0  . ARIPiprazole (ABILIFY) 2 MG tablet TAKE 1 TABLET BY MOUTH EVERY DAY 90 tablet 1  . Ascorbic Acid (VITAMIN C PO) Take 1 tablet by mouth daily.    . diclofenac (VOLTAREN) 50 MG EC tablet Take 50 mg by mouth as needed for mild pain.     . DULoxetine (CYMBALTA) 60 MG capsule Take 1 capsule (60 mg total) by mouth daily. 30 capsule 1  . furosemide (LASIX) 20 MG tablet TAKE 1 TABLET (20 MG TOTAL) BY MOUTH AS NEEDED FOR FLUID OR EDEMA. 90 tablet 0  . hydrOXYzine (ATARAX/VISTARIL) 10 MG tablet Take 1  tablet (10 mg total) by mouth 3 (three) times daily as needed. 30 tablet 0  . KLOR-CON M20 20 MEQ tablet TAKE 1 TABLET (20 MEQ TOTAL) BY MOUTH DAILY AS NEEDED (TAKE WITH LASIX). 90 tablet 0  . modafinil (PROVIGIL) 200 MG tablet Take 1 tablet (200 mg total) by mouth daily. 30 tablet 3  . Multiple Vitamins-Minerals (ZINC PO) Take 1 tablet by mouth daily.    . Probiotic Product (PROBIOTIC PO) Take 1 tablet by mouth daily.    . traMADol (ULTRAM) 50 MG tablet Take 50 mg by mouth every 6 (six) hours as needed.    . traZODone (DESYREL) 50 MG tablet Take 1.5-2 tablets (75-100 mg total) by mouth at bedtime as needed for sleep. Patient has supplies 90 tablet 1  . valACYclovir (VALTREX) 500 MG tablet Take 1 tablet (500 mg total) by mouth daily. And twice daily for outbreaks 35 tablet 12  . Vitamin D, Ergocalciferol, (DRISDOL) 1.25 MG (50000 UT) CAPS capsule TAKE 1 CAPSULE (50,000 UNITS TOTAL) BY MOUTH EVERY 7 (SEVEN) DAYS. 12 capsule 0   No current facility-administered medications for this visit.     Musculoskeletal: Strength & Muscle Tone: UTA Gait & Station: normal Patient leans: N/A  Psychiatric Specialty Exam: Review of Systems  Psychiatric/Behavioral: Positive for decreased concentration. The patient is nervous/anxious.   All other systems reviewed and are negative.   There were no vitals taken for this visit.There is no height or weight on file to calculate BMI.  General Appearance: Casual  Eye Contact:  Fair  Speech:  Clear and Coherent  Volume:  Normal  Mood:  Anxious  Affect:  Congruent  Thought Process:  Goal Directed and Descriptions of Associations: Intact  Orientation:  Full (Time, Place, and Person)  Thought Content: Logical   Suicidal Thoughts:  No  Homicidal Thoughts:  No  Memory:  Immediate;   Fair Recent;   Fair Remote;   Fair  Judgement:  Fair  Insight:  Fair  Psychomotor  Activity:  Normal  Concentration:  Concentration: Fair and Attention Span: Fair  Recall:  [employer] of Knowledge: Fair  Language: Fair  Akathisia:  No  Handed:  Right  AIMS (if indicated): Denies tremors, rigidity  Assets:  Communication Skills Desire for Improvement Housing Talents/Skills  ADL's:  Intact  Cognition: WNL  Sleep:  Fair   Screenings: GAD-7     Office Visit from 11/07/2019 in [facility] Office Visit from 09/09/2019 in [facility] Office Visit from 04/22/2019 in [facility] Office Visit from 03/20/2019 in [facility] Office [facility]  Total GAD-7 Score  12  6  3  4  3     PHQ2-9     Office Visit from 11/07/2019 in [facility] Office Visit from 09/09/2019 in [facility] Office Visit from 08/02/2019 in [facility] Office Visit from 04/22/2019 in [facility] Office Visit from 03/20/2019 in [facility]  PHQ-2 Total Score  4  2  6  1  4   PHQ-9 Total Score  10  11  19  4  8        Assessment and Plan: Teresa Gallagher is a 59 year old African-American female who is divorced, employed, lives in [place], has a history of anxiety, depression, fibromyalgia was evaluated by telemedicine today.  She is biologically predisposed given her family history as well as history of trauma.  Patient with psychosocial stressors of the current pandemic, work-related problems and worsening concentration and focus problems.  Patient will benefit from referral for ADHD testing as well as psychotherapy sessions.  Plan as noted below.  Plan MDD-improving Cymbalta 60 mg p.o. daily Abilify 2 mg p.o. daily Trazodone 75 mg to 100 mg p.o. nightly as needed for sleep  GAD-improving Cymbalta 60 mg p.o. daily Patient advised to connect with a therapist at [facility] counseling center.  Alcohol use disorder mild in early remission We will monitor closely.  Attention and concentration  deficit-rule out ADHD Patient was able to complete ADHD self-report scale and scored high on the same. Will refer her for ADHD testing. We will send a referral to [place] attention specialist.  Follow-up in clinic in 4 to 6 weeks or sooner if needed.  March 17 at 4 PM  I have spent atleast 30 minutes non face to face with patient today. More than 50 % of the time was spent for preparing to see the patient ( e.g., review of test, records ), ordering medications and test ,psychoeducation and supportive psychotherapy and care coordination,as well as documenting clinical information in electronic health record. This note was generated in part or whole with voice recognition software. Voice recognition is usually quite accurate but there are transcription errors that can and very often do occur. I apologize for any typographical errors that were not detected and corrected.       Teresa Alert, MD 01/20/2020, 5:00 PM

## 2020-02-07 ENCOUNTER — Other Ambulatory Visit: Payer: Self-pay

## 2020-02-07 DIAGNOSIS — F411 Generalized anxiety disorder: Secondary | ICD-10-CM

## 2020-02-07 DIAGNOSIS — F330 Major depressive disorder, recurrent, mild: Secondary | ICD-10-CM

## 2020-02-10 ENCOUNTER — Ambulatory Visit: Payer: BC Managed Care – PPO | Admitting: Family Medicine

## 2020-02-10 ENCOUNTER — Encounter: Payer: Self-pay | Admitting: Family Medicine

## 2020-02-11 ENCOUNTER — Encounter: Payer: Self-pay | Admitting: Family Medicine

## 2020-02-11 ENCOUNTER — Other Ambulatory Visit: Payer: Self-pay

## 2020-02-11 ENCOUNTER — Ambulatory Visit (INDEPENDENT_AMBULATORY_CARE_PROVIDER_SITE_OTHER): Payer: BC Managed Care – PPO | Admitting: Family Medicine

## 2020-02-11 VITALS — BP 126/84 | HR 84 | Temp 97.9°F | Resp 16 | Ht 65.0 in | Wt 205.3 lb

## 2020-02-11 DIAGNOSIS — E559 Vitamin D deficiency, unspecified: Secondary | ICD-10-CM

## 2020-02-11 DIAGNOSIS — B009 Herpesviral infection, unspecified: Secondary | ICD-10-CM

## 2020-02-11 DIAGNOSIS — E669 Obesity, unspecified: Secondary | ICD-10-CM

## 2020-02-11 DIAGNOSIS — G4730 Sleep apnea, unspecified: Secondary | ICD-10-CM

## 2020-02-11 DIAGNOSIS — G4710 Hypersomnia, unspecified: Secondary | ICD-10-CM

## 2020-02-11 DIAGNOSIS — F411 Generalized anxiety disorder: Secondary | ICD-10-CM

## 2020-02-11 DIAGNOSIS — F330 Major depressive disorder, recurrent, mild: Secondary | ICD-10-CM

## 2020-02-11 MED ORDER — VALACYCLOVIR HCL 500 MG PO TABS: 500.0000 mg | ORAL_TABLET | Freq: Every day | ORAL | 5 refills | Status: DC

## 2020-02-11 MED ORDER — VITAMIN D (ERGOCALCIFEROL) 1.25 MG (50000 UNIT) PO CAPS: 50000.0000 [IU] | ORAL_CAPSULE | ORAL | 0 refills | Status: DC

## 2020-02-11 NOTE — Progress Notes (Signed)
Name: Teresa Gallagher   MRN: [id number]    DOB: 10/28/1961   Date:02/11/2020       Progress Note  Subjective  Chief Complaint  Chief Complaint  Patient presents with  . Follow-up    3 month recheck    HPI  Sleep Apnea: she was diagnosed with OSA in 2016 , she has been compliant with CPAP machine, she states she keeps it on for at least 4 hours per night. She denies morning headaches, she states Provigil helps but is also starting on Adderal by psychiatrist and will start today. She states no longer gasping as much at night with new mask  SOB/wheezing: she states that she has SOB with moderate activity ( but doing better now ) , she also used to have episodes of  wheezing when laughing - but that has resolved alsoshe has seen pulmonologist and cardiologist. Still has edema on legs intermittently and takes lasix and potassium prn. Seen by Dr. Rockey Situ in the pastShe is doing better, she is taking Silver Sneakers class and she is feeling good, exercising - cardio  5 days weekly also doing yoga once a week and a stretch and balance once a week and weights once a week on top of cardio   GAD/mild recurrent depression: she saw Dr. Shea Evans for the first time Nov 10 th, 2020. She is still on Duloxetine 60 mg but she was supposed to go up to 120 mg ( but she is afraid to go up )  and Abilify 2 mg, she was given Adderal 20 mg but she just picked it up so she has not started it yet. Explained Adderal may cause bp to spike and she needs to monitor . She continues to have problems focusing, forgets to pay bills but is trying to get better . She has been taking Trazodone and Melatonin and seems to be helping her sleep   Obesity:she has morbid obesity based on BMI above 35 , knee pain and OSA, also has OA. Weight is down to 205 lbs  Herpes Genital: she states she used to take it prn, however she has been more stressed and outbreaks around December and January she had at least two episodes and wants to go  back on maintenance medication.   Patient Active Problem List   Diagnosis Date Noted  . Attention and concentration deficit 01/20/2020  . Alcohol use disorder, mild, in early remission 10/29/2019  . Old tear of meniscus of right knee 03/20/2019  . SOB (shortness of breath) 08/29/2017  . Episodic tension-type headache, not intractable 12/20/2016  . Rib pain on right side 11/27/2015  . History of blood transfusion 07/30/2015  . GAD (generalized anxiety disorder) 07/30/2015  . Chronic constipation 07/13/2015  . Insomnia 07/13/2015  . MDD (major depressive disorder), recurrent episode, mild ([place]) 07/13/2015  . Fibromyalgia syndrome 07/13/2015  . 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 Morbid obesity ([place]) 07/13/2015  . Hypersomnia with sleep apnea 07/13/2015  . Vitamin D deficiency 07/13/2015    Past Surgical History:  Procedure Laterality Date  . BUNIONECTOMY Right 11/07/2014   [facility] at [facility]   . CARPAL TUNNEL RELEASE Left 2012  . CESAREAN SECTION    . LAPAROSCOPIC HYSTERECTOMY     followed by 3 cuff repairs  . LIPOMA EXCISION  2012  . REFRACTIVE SURGERY Bilateral   . TONSILLECTOMY      Family History  Problem Relation Age of Onset  . Heart attack Father   .  Alcohol abuse Father   . Bipolar disorder Brother   . Depression Brother   . Bipolar disorder Brother   . Depression Brother   . Alcohol abuse Brother   . Heart attack Paternal Uncle   . COPD Sister        end stages    Social History   Tobacco Use  . Smoking status: Never Smoker  . Smokeless tobacco: Never Used  Substance Use Topics  . Alcohol use: Yes    Alcohol/week: 0.0 standard drinks    Comment: a bottle of wine a week. same with liquor  . Drug use: No     Current Outpatient Medications:  .  ALPRAZolam (XANAX) 0.5 MG tablet, Take 0.5 tablets (0.25 mg total) by mouth daily as needed for anxiety., Disp: 10 tablet, Rfl: 0 .  amphetamine-dextroamphetamine (ADDERALL XR) 20 MG 24  hr capsule, Take 20 mg by mouth every morning., Disp: , Rfl:  .  ARIPiprazole (ABILIFY) 2 MG tablet, TAKE 1 TABLET BY MOUTH EVERY DAY, Disp: 90 tablet, Rfl: 1 .  Ascorbic Acid (VITAMIN C PO), Take 1 tablet by mouth daily., Disp: , Rfl:  .  diclofenac (VOLTAREN) 50 MG EC tablet, Take 50 mg by mouth as needed for mild pain. , Disp: , Rfl:  .  DULoxetine (CYMBALTA) 60 MG capsule, Take 1 capsule (60 mg total) by mouth daily., Disp: 30 capsule, Rfl: 1 .  furosemide (LASIX) 20 MG tablet, TAKE 1 TABLET (20 MG TOTAL) BY MOUTH AS NEEDED FOR FLUID OR EDEMA., Disp: 90 tablet, Rfl: 0 .  hydrOXYzine (ATARAX/VISTARIL) 10 MG tablet, Take 1 tablet (10 mg total) by mouth 3 (three) times daily as needed., Disp: 30 tablet, Rfl: 0 .  KLOR-CON M20 20 MEQ tablet, TAKE 1 TABLET (20 MEQ TOTAL) BY MOUTH DAILY AS NEEDED (TAKE WITH LASIX)., Disp: 90 tablet, Rfl: 0 .  modafinil (PROVIGIL) 200 MG tablet, Take 1 tablet (200 mg total) by mouth daily., Disp: 30 tablet, Rfl: 3 .  Multiple Vitamins-Minerals (ZINC PO), Take 1 tablet by mouth daily., Disp: , Rfl:  .  Probiotic Product (PROBIOTIC PO), Take 1 tablet by mouth daily., Disp: , Rfl:  .  traMADol (ULTRAM) 50 MG tablet, Take 50 mg by mouth every 6 (six) hours as needed., Disp: , Rfl:  .  traZODone (DESYREL) 50 MG tablet, Take 1.5-2 tablets (75-100 mg total) by mouth at bedtime as needed for sleep. Patient has supplies, Disp: 90 tablet, Rfl: 1 .  valACYclovir (VALTREX) 500 MG tablet, Take 1 tablet (500 mg total) by mouth daily. And twice daily for outbreaks, Disp: 35 tablet, Rfl: 12 .  Vitamin D, Ergocalciferol, (DRISDOL) 1.25 MG (50000 UT) CAPS capsule, TAKE 1 CAPSULE (50,000 UNITS TOTAL) BY MOUTH EVERY 7 (SEVEN) DAYS., Disp: 12 capsule, Rfl: 0  No Known Allergies  I personally reviewed active problem list, medication list, allergies, family history, social history, health maintenance with the patient/caregiver today.   ROS  Constitutional: Negative for fever or weight  change.  Respiratory: Negative for cough and shortness of breath.   Cardiovascular: Negative for chest pain or palpitations.  Gastrointestinal: Negative for abdominal pain, no bowel changes.  Musculoskeletal: Negative for gait problem or joint swelling.  Skin: Negative for rash.  Neurological: Negative for dizziness or headache.  No other specific complaints in a complete review of systems (except as listed in HPI above).  Objective  Vitals:   02/11/20 0830  BP: 126/84  Pulse: 84  Resp: 16  Temp: 97.9  F (36.6 C)  TempSrc: Temporal  SpO2: 95%  Weight: 205 lb 4.8 oz (93.1 kg)  Height: 5\' 5"  (1.651 m)    Body mass index is 34.16 kg/m.  Physical Exam  Constitutional: Patient appears well-developed and well-nourished. Obese  No distress.  HEENT: head atraumatic, normocephalic, pupils equal and reactive to light Cardiovascular: Normal rate, regular rhythm and normal heart sounds.  No murmur heard. No BLE edema. Pulmonary/Chest: Effort normal and breath sounds normal. No respiratory distress. Abdominal: Soft.  There is no tenderness. Psychiatric: Patient has a normal mood and affect. behavior is normal. Judgment and thought content normal.  Recent Results (from the past 2160 hour(s))  HM MAMMOGRAPHY     Status: None   Collection Time: 12/23/19 12:00 AM  Result Value Ref Range   HM Mammogram 0-4 Bi-Rad 0-4 Bi-Rad, Self Reported Normal    PHQ2/9: Depression screen [facility] 2/9 11/07/2019 09/09/2019 08/02/2019 04/22/2019 03/19/2019  Decreased Interest 1 1 3 1 2   Down, Depressed, Hopeless 3 1 3  0 2  PHQ - 2 Score 4 2 6 1 4   Altered sleeping 1 3 2  0 0  Tired, decreased energy 1 1 3 1 2   Change in appetite 1 1 1  0 1  Feeling bad or failure about yourself  0 0 2 0 0  Trouble concentrating 3 3 3 1  0  Moving slowly or fidgety/restless 0 1 2 1 1   Suicidal thoughts 0 0 0 0 0  PHQ-9 Score 10 11 19 4 8   Difficult doing work/chores Very difficult Somewhat difficult Extremely dIfficult  Somewhat difficult Somewhat difficult  Some recent data might be hidden    phq 9 is positive   Fall Risk: Fall Risk  02/11/2020 11/07/2019 09/09/2019 08/02/2019 04/22/2019  Falls in the past year? 0 1 0 0 0  Number falls in past yr: 0 0 0 0 0  Injury with Fall? 0 1 0 0 0  Comment - Injury her ligament in her left hand - - -  Follow up Falls evaluation completed - - - -    Assessment & Plan  1. MDD (major depressive disorder), recurrent episode, mild ([place])  Continue follow up with psychiatrist   2. Obesity (BMI 30.0-34.9)  Discussed with the patient the risk posed by an increased BMI. Discussed importance of portion control, calorie counting and at least 150 minutes of physical activity weekly. Avoid sweet beverages and drink more water. Eat at least 6 servings of fruit and vegetables daily   3. GAD (generalized anxiety disorder)   4. Sleep apnea with hypersomnolence  Doing better   5. Vitamin D deficiency  - Vitamin D, Ergocalciferol, (DRISDOL) 1.25 MG (50000 UNIT) CAPS capsule; Take 1 capsule (50,000 Units total) by mouth every 7 (seven) days.  Dispense: 12 capsule; Refill: 0  6. Herpes simplex type 2 infection  - valACYclovir (VALTREX) 500 MG tablet; Take 1 tablet (500 mg total) by mouth daily. And twice daily for outbreaks  Dispense: 35 tablet; Refill: 5

## 2020-03-04 ENCOUNTER — Other Ambulatory Visit: Payer: Self-pay

## 2020-03-04 ENCOUNTER — Ambulatory Visit (INDEPENDENT_AMBULATORY_CARE_PROVIDER_SITE_OTHER): Payer: BC Managed Care – PPO | Admitting: Psychiatry

## 2020-03-04 DIAGNOSIS — Z5329 Procedure and treatment not carried out because of patient's decision for other reasons: Secondary | ICD-10-CM

## 2020-03-04 NOTE — Progress Notes (Signed)
No response to call or text - attempted several times - left VM.

## 2020-03-09 ENCOUNTER — Other Ambulatory Visit: Payer: Self-pay | Admitting: Family Medicine

## 2020-03-09 ENCOUNTER — Telehealth: Payer: Self-pay

## 2020-03-09 DIAGNOSIS — Z111 Encounter for screening for respiratory tuberculosis: Secondary | ICD-10-CM

## 2020-03-09 NOTE — Telephone Encounter (Signed)
Copied from [place] [id number]. Topic: General - Other >> Mar 09, 2020  7:57 AM Rayann Heman wrote: Reason for CRM: pt called and stated that she needs a TB test for a job. Please advise

## 2020-03-12 LAB — QUANTIFERON-TB GOLD PLUS
Mitogen-NIL: 9.01 IU/mL
NIL: 0.04 IU/mL
QuantiFERON-TB Gold Plus: NEGATIVE
TB1-NIL: 0 IU/mL
TB2-NIL: 0.01 IU/mL

## 2020-06-10 ENCOUNTER — Encounter: Payer: Self-pay | Admitting: Family Medicine

## 2020-06-10 ENCOUNTER — Other Ambulatory Visit: Payer: Self-pay

## 2020-06-10 ENCOUNTER — Ambulatory Visit (INDEPENDENT_AMBULATORY_CARE_PROVIDER_SITE_OTHER): Payer: BC Managed Care – PPO | Admitting: Family Medicine

## 2020-06-10 VITALS — BP 106/80 | HR 89 | Temp 96.9°F | Resp 16 | Ht 65.0 in | Wt 210.7 lb

## 2020-06-10 DIAGNOSIS — M542 Cervicalgia: Secondary | ICD-10-CM

## 2020-06-10 DIAGNOSIS — F331 Major depressive disorder, recurrent, moderate: Secondary | ICD-10-CM | POA: Diagnosis not present

## 2020-06-10 DIAGNOSIS — E559 Vitamin D deficiency, unspecified: Secondary | ICD-10-CM | POA: Diagnosis not present

## 2020-06-10 DIAGNOSIS — G4730 Sleep apnea, unspecified: Secondary | ICD-10-CM

## 2020-06-10 DIAGNOSIS — R0602 Shortness of breath: Secondary | ICD-10-CM

## 2020-06-10 DIAGNOSIS — M545 Low back pain, unspecified: Secondary | ICD-10-CM

## 2020-06-10 DIAGNOSIS — G4710 Hypersomnia, unspecified: Secondary | ICD-10-CM

## 2020-06-10 DIAGNOSIS — F411 Generalized anxiety disorder: Secondary | ICD-10-CM

## 2020-06-10 DIAGNOSIS — R600 Localized edema: Secondary | ICD-10-CM

## 2020-06-10 DIAGNOSIS — B009 Herpesviral infection, unspecified: Secondary | ICD-10-CM | POA: Diagnosis not present

## 2020-06-10 MED ORDER — VALACYCLOVIR HCL 500 MG PO TABS: 500.0000 mg | ORAL_TABLET | Freq: Every day | ORAL | 1 refills | Status: DC

## 2020-06-10 MED ORDER — VITAMIN D (ERGOCALCIFEROL) 1.25 MG (50000 UNIT) PO CAPS: 50000.0000 [IU] | ORAL_CAPSULE | ORAL | 1 refills | Status: DC

## 2020-06-10 MED ORDER — MODAFINIL 200 MG PO TABS: 200.0000 mg | ORAL_TABLET | Freq: Every day | ORAL | 3 refills | Status: DC

## 2020-06-10 MED ORDER — ALBUTEROL SULFATE HFA 108 (90 BASE) MCG/ACT IN AERS: 2.0000 | INHALATION_SPRAY | Freq: Four times a day (QID) | RESPIRATORY_TRACT | 0 refills | Status: DC | PRN

## 2020-06-10 NOTE — Progress Notes (Addendum)
Name: Teresa Gallagher   MRN: [id number]    DOB: 03-27-1961   Date:06/10/2020       Progress Note  Subjective  Chief Complaint  Chief Complaint  Patient presents with  . Anxiety  . Depression  . Sleep Apnea    She continues to struggle with sleep. She has had a sleep study and uses a cpap machine. She thinks that she needs to be re-evaluated.    HPI  Sleep Apnea: she was diagnosed with OSA in 2016 , she has been compliant with CPAP machine, she states she keeps it on for at least 4 hours per night, she states does not seem to be resting well lately and would like to have another study done. She denies morning headaches, she states Provigilhelps with day time fatigue  SOB/wheezing: she states that she has SOB with moderate activity( but doing better now ), she alsoused to have episodes ofwheezing when laughing - but that has resolved alsoshe has seen pulmonologist and cardiologist. Still has edema on legs intermittently and takes lasix and potassium prn. Seen by Dr. Rockey Situ in the pastShe would like to resume Symbicort because she states it helps with sob , using prn only, we will switch to rescue inhaler   GAD/mild recurrent depression:she saw Dr. Shea Evans for the first time Nov 10 th, 2020. She is still on Duloxetine 60 mg and Abilify 2 mg, she was given Adderal 20 mg by Dr. Toy Care but lost for follow up. 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 She enjoys hospice, she is feeling stretched thin, feeling overwhelmed. 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   Obesity:she has morbid obesity based on BMI above 35 , knee pain and OSA, also has OA.Weight is up to 210.7 lbs She has been stressed again  Herpes Genital: she states she used to take it prn, however she has been more stressed and outbreaks around December and January she had at least two episodes, she has been on daily medication since Feb and is doing  well now   Intermittent low back pain and neck pain: she has a history of DDD of cervical spine, feeling more tight lately, pain on lower back that is aching, no radiculitis. She spends a lot of time in front of a computer. She has been stretching and seems to help  Patient Active Problem List   Diagnosis Date Noted  . Attention and concentration deficit 01/20/2020  . Alcohol use disorder, mild, in early remission 10/29/2019  . Old tear of meniscus of right knee 03/20/2019  . SOB (shortness of breath) 08/29/2017  . Episodic tension-type headache, not intractable 12/20/2016  . Rib pain on right side 11/27/2015  . History of blood transfusion 07/30/2015  . GAD (generalized anxiety disorder) 07/30/2015  . Chronic constipation 07/13/2015  . Insomnia 07/13/2015  . MDD (major depressive disorder), recurrent episode, mild ([place]) 07/13/2015  . Fibromyalgia syndrome 07/13/2015  . 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 Morbid obesity ([place]) 07/13/2015  . Hypersomnia with sleep apnea 07/13/2015  . Vitamin D deficiency 07/13/2015    Past Surgical History:  Procedure Laterality Date  . BUNIONECTOMY Right 11/07/2014   Dr. Hulda Humphrey at [facility]   . CARPAL TUNNEL RELEASE Left 2012  . CESAREAN SECTION    . LAPAROSCOPIC HYSTERECTOMY     followed by 3 cuff repairs  . LIPOMA EXCISION  2012  .  REFRACTIVE SURGERY Bilateral   . TONSILLECTOMY      Family History  Problem Relation Age of Onset  . Heart attack Father   . Alcohol abuse Father   . Bipolar disorder Brother   . Depression Brother   . Bipolar disorder Brother   . Depression Brother   . Alcohol abuse Brother   . Heart attack Paternal Uncle   . COPD Sister        end stages    Social History   Tobacco Use  . Smoking status: Never Smoker  . Smokeless tobacco: Never Used  Substance Use Topics  . Alcohol use: Yes    Alcohol/week: 0.0 standard drinks    Comment: a bottle of wine a week. same with liquor      Current Outpatient Medications:  .  ARIPiprazole (ABILIFY) 2 MG tablet, TAKE 1 TABLET BY MOUTH EVERY DAY, Disp: 90 tablet, Rfl: 1 .  DULoxetine (CYMBALTA) 60 MG capsule, Take 1 capsule (60 mg total) by mouth daily., Disp: 30 capsule, Rfl: 1 .  furosemide (LASIX) 20 MG tablet, TAKE 1 TABLET (20 MG TOTAL) BY MOUTH AS NEEDED FOR FLUID OR EDEMA., Disp: 90 tablet, Rfl: 0 .  KLOR-CON M20 20 MEQ tablet, TAKE 1 TABLET (20 MEQ TOTAL) BY MOUTH DAILY AS NEEDED (TAKE WITH LASIX)., Disp: 90 tablet, Rfl: 0 .  modafinil (PROVIGIL) 200 MG tablet, Take 1 tablet (200 mg total) by mouth daily., Disp: 30 tablet, Rfl: 3 .  Multiple Vitamins-Minerals (ZINC PO), Take 1 tablet by mouth daily., Disp: , Rfl:  .  Probiotic Product (PROBIOTIC PO), Take 1 tablet by mouth daily., Disp: , Rfl:  .  valACYclovir (VALTREX) 500 MG tablet, Take 1 tablet (500 mg total) by mouth daily. And twice daily for outbreaks, Disp: 115 tablet, Rfl: 1 .  albuterol (VENTOLIN HFA) 108 (90 Base) MCG/ACT inhaler, Inhale 2 puffs into the lungs every 6 (six) hours as needed for wheezing or shortness of breath., Disp: 18 g, Rfl: 0 .  amphetamine-dextroamphetamine (ADDERALL XR) 20 MG 24 hr capsule, Take 20 mg by mouth every morning. (Patient not taking: Reported on 06/10/2020), Disp: , Rfl:  .  Vitamin D, Ergocalciferol, (DRISDOL) 1.25 MG (50000 UNIT) CAPS capsule, Take 1 capsule (50,000 Units total) by mouth every 7 (seven) days., Disp: 12 capsule, Rfl: 1  No Known Allergies  I personally reviewed active problem list, medication list, allergies, family history, social history, health maintenance with the patient/caregiver today.   ROS  Constitutional: Negative for fever or weight change.  Respiratory: Negative for cough and shortness of breath.   Cardiovascular: Negative for chest pain or palpitations.  Gastrointestinal: Negative for abdominal pain, no bowel changes.  Musculoskeletal: Negative for gait problem or joint swelling.  Skin:  Negative for rash.  Neurological: Negative for dizziness or headache.  No other specific complaints in a complete review of systems (except as listed in HPI above).  Objective  Vitals:   06/10/20 1010  BP: 106/80  Pulse: 89  Resp: 16  Temp: (!) 96.9 F (36.1 C)  TempSrc: Temporal  SpO2: 95%  Weight: 210 lb 11.2 oz (95.6 kg)  Height: 5\' 5"  (1.651 m)    Body mass index is 35.06 kg/m.  Physical Exam  Constitutional: Patient appears well-developed and well-nourished. Obese  No distress.  HEENT: head atraumatic, normocephalic, pupils equal and reactive to light, neck supple Cardiovascular: Normal rate, regular rhythm and normal heart sounds.  No murmur heard. No BLE edema. Pulmonary/Chest: Effort normal and  breath sounds normal. No respiratory distress. Abdominal: Soft.  There is no tenderness. Psychiatric: Patient has a normal mood and affect. behavior is normal. Judgment and thought content normal.  PHQ2/9: Depression screen [facility] 2/9 06/10/2020 02/11/2020 11/07/2019 09/09/2019 08/02/2019  Decreased Interest 3 3 1 1 3   Down, Depressed, Hopeless 2 3 3 1 3   PHQ - 2 Score 5 6 4 2 6   Altered sleeping 3 2 1 3 2   Tired, decreased energy 3 2 1 1 3   Change in appetite 2 3 1 1 1   Feeling bad or failure about yourself  1 1 0 0 2  Trouble concentrating 2 3 3 3 3   Moving slowly or fidgety/restless 1 3 0 1 2  Suicidal thoughts 0 0 0 0 0  PHQ-9 Score 17 20 10 11 19   Difficult doing work/chores Very difficult Extremely dIfficult Very difficult Somewhat difficult Extremely dIfficult  Some recent data might be hidden    phq 9 is positive   Fall Risk: Fall Risk  06/10/2020 02/11/2020 11/07/2019 09/09/2019 08/02/2019  Falls in the past year? 0 0 1 0 0  Number falls in past yr: 0 0 0 0 0  Injury with Fall? 0 0 1 0 0  Comment - - Injury her ligament in her left hand - -  Follow up - Falls evaluation completed - - -     Functional Status Survey: Is the patient deaf or have difficulty  hearing?: No Does the patient have difficulty seeing, even when wearing glasses/contacts?: No Does the patient have difficulty concentrating, remembering, or making decisions?: No Does the patient have difficulty walking or climbing stairs?: No Does the patient have difficulty dressing or bathing?: No Does the patient have difficulty doing errands alone such as visiting a doctor's office or shopping?: No    Assessment & Plan  1. Moderate episode of recurrent major depressive disorder (HCC)  She needs to contact Dr. Toy Care   2. Vitamin D deficiency  - Vitamin D, Ergocalciferol, (DRISDOL) 1.25 MG (50000 UNIT) CAPS capsule; Take 1 capsule (50,000 Units total) by mouth every 7 (seven) days.  Dispense: 12 capsule; Refill: 1  3. Sleep apnea with hypersomnolence  - modafinil (PROVIGIL) 200 MG tablet; Take 1 tablet (200 mg total) by mouth daily.  Dispense: 30 tablet; Refill: 3  4. Herpes simplex type 2 infection  - valACYclovir (VALTREX) 500 MG tablet; Take 1 tablet (500 mg total) by mouth daily. And twice daily for outbreaks  Dispense: 115 tablet; Refill: 1  5. GAD (generalized anxiety disorder)  More anxious , doing too many things, missed visit with psychiatrist   6. Lower extremity edema  Taking medication   7. Morbid obesity ([place])  Discussed with the patient the risk posed by an increased BMI. Discussed importance of portion control, calorie counting and at least 150 minutes of physical activity weekly. Avoid sweet beverages and drink more water. Eat at least 6 servings of fruit and vegetables daily    8. SOB (shortness of breath)  - albuterol (VENTOLIN HFA) 108 (90 Base) MCG/ACT inhaler; Inhale 2 puffs into the lungs every 6 (six) hours as needed for wheezing or shortness of breath.  Dispense: 18 g; Refill: 0  9. Intermittent low back pain  Continue stretching   10. Cervical pain (neck)

## 2020-06-10 NOTE — Addendum Note (Signed)
Addended by: Steele Sizer F on: 06/10/2020 10:54 AM   Modules accepted: Orders

## 2020-07-22 ENCOUNTER — Ambulatory Visit: Payer: BC Managed Care – PPO | Attending: Neurology

## 2020-07-22 DIAGNOSIS — Z6834 Body mass index (BMI) 34.0-34.9, adult: Secondary | ICD-10-CM | POA: Insufficient documentation

## 2020-07-22 DIAGNOSIS — G4761 Periodic limb movement disorder: Secondary | ICD-10-CM | POA: Insufficient documentation

## 2020-07-22 DIAGNOSIS — G4710 Hypersomnia, unspecified: Secondary | ICD-10-CM | POA: Insufficient documentation

## 2020-07-22 DIAGNOSIS — G4733 Obstructive sleep apnea (adult) (pediatric): Secondary | ICD-10-CM | POA: Diagnosis present

## 2020-07-23 ENCOUNTER — Other Ambulatory Visit: Payer: Self-pay

## 2020-07-23 ENCOUNTER — Other Ambulatory Visit: Payer: Self-pay | Admitting: Orthopedic Surgery

## 2020-07-23 DIAGNOSIS — M25332 Other instability, left wrist: Secondary | ICD-10-CM

## 2020-07-30 ENCOUNTER — Telehealth: Payer: Self-pay | Admitting: Family Medicine

## 2020-07-30 NOTE — Telephone Encounter (Signed)
Copied from [place] [id number]. Topic: General - Other >> Jul 30, 2020 11:16 AM Keene Breath wrote: Reason for CRM: Patient called to ask for the results of her sleep study test.  Her My Chart does not show the results.  She is going to see a specialist and needs the results of the study.  Please advise and call patient to let her know.  CB# [phone]

## 2020-07-31 NOTE — Telephone Encounter (Signed)
Patient would like to know results of sleep study. Please advise.

## 2020-08-04 ENCOUNTER — Other Ambulatory Visit: Payer: Self-pay | Admitting: Family Medicine

## 2020-08-04 DIAGNOSIS — G4710 Hypersomnia, unspecified: Secondary | ICD-10-CM

## 2020-08-04 NOTE — Progress Notes (Signed)
Name: Teresa Gallagher   MRN: [id number]    DOB: Nov 28, 1961   Date:08/05/2020       Progress Note  Subjective  Chief Complaint  Chief Complaint  Patient presents with  . Annual Exam    HPI  Patient presents for annual CPE.   Well woman: she states that she has not been sexually active and denies vaginal symptoms at this time. No longer has chest pain, but she continues to have right breast pain ( seen by surgeon in the past and thought to be muscular skeletal but insurance denied MRI coverage).  No change bowel movements.    Diet: she skips meals, not eating a lot of junk food, discussed high calcium diet and vitamin D Exercise: not currently physically active, advised 150 minutes per week       Office Visit from 02/11/2020 in [facility]  AUDIT-C Score 0     Depression: Phq 9 is  Positive - sees Dr. Toy Care - psychiatrist   Depression screen [facility] 2/9 08/05/2020 08/05/2020 06/10/2020 02/11/2020 11/07/2019  Decreased Interest 3 0 '3 3 1  ' Down, Depressed, Hopeless 2 0 '2 3 3  ' PHQ - 2 Score 5 0 '5 6 4  ' Altered sleeping 2 0 '3 2 1  ' Tired, decreased energy 3 0 '3 2 1  ' Change in appetite 2 0 '2 3 1  ' Feeling bad or failure about yourself  1 0 1 1 0  Trouble concentrating 2 0 '2 3 3  ' Moving slowly or fidgety/restless 3 0 1 3 0  Suicidal thoughts 0 0 0 0 0  PHQ-9 Score 18 0 '17 20 10  ' Difficult doing work/chores Very difficult - Very difficult Extremely dIfficult Very difficult  Some recent data might be hidden   Hypertension: BP Readings from Last 3 Encounters:  08/05/20 (!) 130/96  06/10/20 106/80  02/11/20 126/84   Obesity: Wt Readings from Last 3 Encounters:  08/05/20 210 lb 6.4 oz (95.4 kg)  06/10/20 210 lb 11.2 oz (95.6 kg)  02/11/20 205 lb 4.8 oz (93.1 kg)   BMI Readings from Last 3 Encounters:  08/05/20 36.98 kg/m  06/10/20 35.06 kg/m  02/11/20 34.16 kg/m     Hep C Screening: 09/2018  STD testing and prevention (HIV/chl/gon/syphilis): not interested   Intimate partner violence: negative screen  Pain during Intercourse: not sexually active  Menstrual History/LMP/Abnormal Bleeding: s/ p hysterectomy  Incontinence Symptoms: no problems   Breast cancer:  - Last Mammogram: up to date , repeat 12/2019  - BRCA gene screening: N/A  Osteoporosis: Discussed high calcium and vitamin D supplementation, weight bearing exercises  Cervical cancer screening: s/p hysterectomy   Skin cancer: Discussed monitoring for atypical lesions  Colorectal cancer: repeat in 2024   Lung cancer:   Low Dose CT Chest recommended if Age 38-80 years, 30 pack-year currently smoking OR have quit w/in 15years. Patient does not qualify.   ECG: 08/2017   Advanced Care Planning: A voluntary discussion about advance care planning including the explanation and discussion of advance directives.  Discussed health care proxy and Living will, and the patient was able to identify a health care proxy as Trixie Dredge - daughter   Patient does have a living will at present time. If patient does have living will, I have requested they bring this to the clinic to be scanned in to their chart.  Lipids: Lab Results  Component Value Date   CHOL 193 08/02/2019   CHOL 193 09/18/2018   CHOL  182 06/20/2017   Lab Results  Component Value Date   HDL 60 08/02/2019   HDL 67 09/18/2018   HDL 59 06/20/2017   Lab Results  Component Value Date   LDLCALC 116 (H) 08/02/2019   LDLCALC 110 (H) 09/18/2018   LDLCALC 106 (H) 06/20/2017   Lab Results  Component Value Date   TRIG 80 08/02/2019   TRIG 69 09/18/2018   TRIG 87 06/20/2017   Lab Results  Component Value Date   CHOLHDL 3.2 08/02/2019   CHOLHDL 2.9 09/18/2018   CHOLHDL 3.1 06/20/2017   No results found for: LDLDIRECT  Glucose: Glucose, Bld  Date Value Ref Range Status  08/02/2019 96 65 - 99 mg/dL Final    Comment:    .            Fasting reference interval .   10/24/2018 106 (H) 70 - 99 mg/dL Final  09/18/2018 91 65  - 99 mg/dL Final    Comment:    .            Fasting reference interval .     Patient Active Problem List   Diagnosis Date Noted  . Attention and concentration deficit 01/20/2020  . Alcohol use disorder, mild, in early remission 10/29/2019  . Old tear of meniscus of right knee 03/20/2019  . SOB (shortness of breath) 08/29/2017  . Episodic tension-type headache, not intractable 12/20/2016  . Rib pain on right side 11/27/2015  . History of blood transfusion 07/30/2015  . GAD (generalized anxiety disorder) 07/30/2015  . Chronic constipation 07/13/2015  . Insomnia 07/13/2015  . MDD (major depressive disorder), recurrent episode, mild ([place]) 07/13/2015  . Fibromyalgia syndrome 07/13/2015  . 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 Morbid obesity ([place]) 07/13/2015  . Hypersomnia with sleep apnea 07/13/2015  . Vitamin D deficiency 07/13/2015    Past Surgical History:  Procedure Laterality Date  . BUNIONECTOMY Right 11/07/2014   Dr. Hulda Humphrey at [facility]   . CARPAL TUNNEL RELEASE Left 2012  . CESAREAN SECTION    . LAPAROSCOPIC HYSTERECTOMY     followed by 3 cuff repairs  . LIPOMA EXCISION  2012  . REFRACTIVE SURGERY Bilateral   . TONSILLECTOMY      Family History  Problem Relation Age of Onset  . Heart attack Father   . Alcohol abuse Father   . Bipolar disorder Brother   . Depression Brother   . Bipolar disorder Brother   . Depression Brother   . Alcohol abuse Brother   . Heart attack Paternal Uncle   . COPD Sister        end stages    Social History   Socioeconomic History  . Marital status: Divorced    Spouse name: larry  . Number of children: 3  . Years of education: Not on file  . Highest education level: Bachelor's degree (e.g., BA, AB, BS)  Occupational History  . Not on file  Tobacco Use  . Smoking status: Never Smoker  . Smokeless tobacco: Never Used  Vaping Use  . Vaping Use: Never used  Substance and Sexual Activity  . Alcohol use: Yes     Alcohol/week: 0.0 standard drinks    Comment: a bottle of wine a week. same with liquor  . Drug use: No  . Sexual activity: Not Currently  Other Topics Concern  . Not on file  Social History Narrative   Works at 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  Social Determinants of Health   Financial Resource Strain: Medium Risk  . Difficulty of Paying Living Expenses: Somewhat hard  Food Insecurity: Food Insecurity Present  . Worried About Charity fundraiser in the Last Year: Sometimes true  . Ran Out of Food in the Last Year: Sometimes true  Transportation Needs: No Transportation Needs  . Lack of Transportation (Medical): No  . Lack of Transportation (Non-Medical): No  Physical Activity: Inactive  . Days of Exercise per Week: 0 days  . Minutes of Exercise per Session: 0 min  Stress: Stress Concern Present  . Feeling of Stress : Very much  Social Connections: Moderately Isolated  . Frequency of Communication with Friends and Family: More than three times a week  . Frequency of Social Gatherings with Friends and Family: Once a week  . Attends Religious Services: Never  . Active Member of Clubs or Organizations: Yes  . Attends Archivist Meetings: More than 4 times per year  . Marital Status: Divorced  Human resources officer Violence: Not At Risk  . Fear of Current or Ex-Partner: No  . Emotionally Abused: No  . Physically Abused: No  . Sexually Abused: No     Current Outpatient Medications:  .  albuterol (VENTOLIN HFA) 108 (90 Base) MCG/ACT inhaler, Inhale 2 puffs into the lungs every 6 (six) hours as needed for wheezing or shortness of breath., Disp: 18 g, Rfl: 0 .  DULoxetine (CYMBALTA) 60 MG capsule, Take 1 capsule (60 mg total) by mouth daily., Disp: 30 capsule, Rfl: 1 .  furosemide (LASIX) 20 MG tablet, TAKE 1 TABLET (20 MG TOTAL) BY MOUTH AS NEEDED FOR FLUID OR EDEMA., Disp: 90 tablet, Rfl: 0 .  KLOR-CON M20 20 MEQ tablet, TAKE 1 TABLET (20 MEQ TOTAL) BY  MOUTH DAILY AS NEEDED (TAKE WITH LASIX)., Disp: 90 tablet, Rfl: 0 .  modafinil (PROVIGIL) 200 MG tablet, Take 1 tablet (200 mg total) by mouth daily., Disp: 30 tablet, Rfl: 3 .  Multiple Vitamins-Minerals (ZINC PO), Take 1 tablet by mouth daily., Disp: , Rfl:  .  Probiotic Product (PROBIOTIC PO), Take 1 tablet by mouth daily., Disp: , Rfl:  .  valACYclovir (VALTREX) 500 MG tablet, Take 1 tablet (500 mg total) by mouth daily. And twice daily for outbreaks, Disp: 115 tablet, Rfl: 1 .  Vitamin D, Ergocalciferol, (DRISDOL) 1.25 MG (50000 UNIT) CAPS capsule, Take 1 capsule (50,000 Units total) by mouth every 7 (seven) days., Disp: 12 capsule, Rfl: 1  No Known Allergies   ROS  Constitutional: Negative for fever or weight change.  Respiratory: Negative for cough and shortness of breath.   Cardiovascular: Negative for chest pain or palpitations.  Gastrointestinal: Negative for abdominal pain, no bowel changes.  Musculoskeletal: positive  for gait problem, right knee  joint swelling.  Skin: Negative for rash.  Neurological: Negative for dizziness or headache.  No other specific complaints in a complete review of systems (except as listed in HPI above).   Objective  Vitals:   08/05/20 1103  BP: (!) 130/96  Pulse: 94  Resp: 16  Temp: 98.2 F (36.8 C)  TempSrc: Oral  SpO2: 99%  Weight: 210 lb 6.4 oz (95.4 kg)  Height: 5' 3.25" (1.607 m)    Body mass index is 36.98 kg/m.  Physical Exam  Constitutional: Patient appears well-developed and well-nourished. No distress.  HENT: Head: Normocephalic and atraumatic. Ears: B TMs ok, no erythema or effusion; Nose: Nose normal. Mouth/Throat: not done  Eyes: Conjunctivae and EOM  are normal. Pupils are equal, round, and reactive to light. No scleral icterus.  Neck: Normal range of motion. Neck supple. No JVD present. No thyromegaly present.  Cardiovascular: Normal rate, regular rhythm and normal heart sounds.  No murmur heard. No BLE  edema. Pulmonary/Chest: Effort normal and breath sounds normal. No respiratory distress. Abdominal: Soft. Bowel sounds are normal, no distension. There is no tenderness. no masses Breast: no lumps or masses, no nipple discharge or rashes FEMALE GENITALIA:  Not done  RECTAL: not done  Musculoskeletal: Normal range of motion, no joint effusions. No gross deformities Neurological: he is alert and oriented to person, place, and time. No cranial nerve deficit. Coordination, balance, strength, speech and gait are normal.  Skin: Skin is warm and dry. No rash noted. No erythema.  Psychiatric: Patient has a normal mood and affect. behavior is normal. Judgment and thought content normal.  Fall Risk: Fall Risk  08/05/2020 06/10/2020 02/11/2020 11/07/2019 09/09/2019  Falls in the past year? 0 0 0 1 0  Number falls in past yr: 0 0 0 0 0  Injury with Fall? 0 0 0 1 0  Comment - - - Injury her ligament in her left hand -  Follow up - - Falls evaluation completed - -    Functional Status Survey: Is the patient deaf or have difficulty hearing?: No Does the patient have difficulty seeing, even when wearing glasses/contacts?: No Does the patient have difficulty concentrating, remembering, or making decisions?: Yes Does the patient have difficulty walking or climbing stairs?: Yes Does the patient have difficulty dressing or bathing?: No Does the patient have difficulty doing errands alone such as visiting a doctor's office or shopping?: No   Assessment & Plan  1. Well adult exam  2. Encounter for screening mammogram for malignant neoplasm of breast  - MM 3D SCREEN BREAST BILATERAL; Future  3. Vitamin D deficiency  - VITAMIN D 25 Hydroxy (Vit-D Deficiency, Fractures)  4. Hyperglycemia  - Hemoglobin A1c  5. Long-term use of high-risk medication  - CBC with Differential/Platelet - COMPLETE METABOLIC PANEL WITH GFR  6. Dyslipidemia  - Lipid panel  7. Height loss  - DG Bone Density;  Future  8. Menopause  - DG Bone Density; Future  -USPSTF grade A and B recommendations reviewed with patient; age-appropriate recommendations, preventive care, screening tests, etc discussed and encouraged; healthy living encouraged; see AVS for patient education given to patient -Discussed importance of 150 minutes of physical activity weekly, eat two servings of fish weekly, eat one serving of tree nuts ( cashews, pistachios, pecans, almonds.Marland Kitchen) every other day, eat 6 servings of fruit/vegetables daily and drink plenty of water and avoid sweet beverages.

## 2020-08-05 ENCOUNTER — Other Ambulatory Visit: Payer: Self-pay | Admitting: Orthopedic Surgery

## 2020-08-05 ENCOUNTER — Other Ambulatory Visit: Payer: Self-pay

## 2020-08-05 ENCOUNTER — Encounter: Payer: Self-pay | Admitting: Family Medicine

## 2020-08-05 ENCOUNTER — Ambulatory Visit (INDEPENDENT_AMBULATORY_CARE_PROVIDER_SITE_OTHER): Payer: BC Managed Care – PPO | Admitting: Family Medicine

## 2020-08-05 VITALS — BP 130/96 | HR 94 | Temp 98.2°F | Resp 16 | Ht 63.25 in | Wt 210.4 lb

## 2020-08-05 DIAGNOSIS — E559 Vitamin D deficiency, unspecified: Secondary | ICD-10-CM | POA: Diagnosis not present

## 2020-08-05 DIAGNOSIS — M25332 Other instability, left wrist: Secondary | ICD-10-CM

## 2020-08-05 DIAGNOSIS — Z0000 Encounter for general adult medical examination without abnormal findings: Secondary | ICD-10-CM | POA: Diagnosis not present

## 2020-08-05 DIAGNOSIS — R29890 Loss of height: Secondary | ICD-10-CM

## 2020-08-05 DIAGNOSIS — Z780 Asymptomatic menopausal state: Secondary | ICD-10-CM

## 2020-08-05 DIAGNOSIS — R739 Hyperglycemia, unspecified: Secondary | ICD-10-CM

## 2020-08-05 DIAGNOSIS — E785 Hyperlipidemia, unspecified: Secondary | ICD-10-CM

## 2020-08-05 DIAGNOSIS — Z79899 Other long term (current) drug therapy: Secondary | ICD-10-CM

## 2020-08-05 DIAGNOSIS — Z1231 Encounter for screening mammogram for malignant neoplasm of breast: Secondary | ICD-10-CM | POA: Diagnosis not present

## 2020-08-05 NOTE — Telephone Encounter (Signed)
She has an appointment today. Please advise.

## 2020-08-05 NOTE — Patient Instructions (Signed)

## 2020-08-06 ENCOUNTER — Other Ambulatory Visit: Payer: Self-pay | Admitting: Family Medicine

## 2020-08-06 LAB — LIPID PANEL
Cholesterol: 199 mg/dL (ref <200)
HDL: 56 mg/dL (ref >=50)
LDL Cholesterol (Calc): 120 mg/dL (calc) — ABNORMAL HIGH
Non-HDL Cholesterol (Calc): 143 mg/dL (calc) — ABNORMAL HIGH (ref <130)
Total CHOL/HDL Ratio: 3.6 (calc) (ref <5.0)
Triglycerides: 123 mg/dL (ref <150)

## 2020-08-06 LAB — COMPLETE METABOLIC PANEL WITH GFR
AG Ratio: 1.8 (calc) (ref 1.0–2.5)
ALT: 12 U/L (ref 6–29)
AST: 14 U/L (ref 10–35)
Albumin: 4.4 g/dL (ref 3.6–5.1)
Alkaline phosphatase (APISO): 70 U/L (ref 37–153)
BUN: 21 mg/dL (ref 7–25)
CO2: 29 mmol/L (ref 20–32)
Calcium: 9.9 mg/dL (ref 8.6–10.4)
Chloride: 102 mmol/L (ref 98–110)
Creat: 0.99 mg/dL (ref 0.50–1.05)
GFR, Est African American: 72 mL/min/{1.73_m2} (ref >=60)
GFR, Est Non African American: 62 mL/min/{1.73_m2} (ref >=60)
Globulin: 2.5 g/dL (calc) (ref 1.9–3.7)
Glucose, Bld: 99 mg/dL (ref 65–99)
Potassium: 4.2 mmol/L (ref 3.5–5.3)
Sodium: 139 mmol/L (ref 135–146)
Total Bilirubin: 0.8 mg/dL (ref 0.2–1.2)
Total Protein: 6.9 g/dL (ref 6.1–8.1)

## 2020-08-06 LAB — CBC WITH DIFFERENTIAL/PLATELET
Absolute Monocytes: 440 cells/uL (ref 200–950)
Basophils Absolute: 33 cells/uL (ref 0–200)
Basophils Relative: 0.4 %
Eosinophils Absolute: 66 cells/uL (ref 15–500)
Eosinophils Relative: 0.8 %
HCT: 42.2 % (ref 35.0–45.0)
Hemoglobin: 14.3 g/dL (ref 11.7–15.5)
Lymphs Abs: 1951 cells/uL (ref 850–3900)
MCH: 29.4 pg (ref 27.0–33.0)
MCHC: 33.9 g/dL (ref 32.0–36.0)
MCV: 86.8 fL (ref 80.0–100.0)
MPV: 12.4 fL (ref 7.5–12.5)
Monocytes Relative: 5.3 %
Neutro Abs: 5810 cells/uL (ref 1500–7800)
Neutrophils Relative %: 70 %
Platelets: 240 10*3/uL (ref 140–400)
RBC: 4.86 10*6/uL (ref 3.80–5.10)
RDW: 14.2 % (ref 11.0–15.0)
Total Lymphocyte: 23.5 %
WBC: 8.3 10*3/uL (ref 3.8–10.8)

## 2020-08-06 LAB — HEMOGLOBIN A1C
Hgb A1c MFr Bld: 5.5 % of total Hgb (ref <5.7)
Mean Plasma Glucose: 111 (calc)
eAG (mmol/L): 6.2 (calc)

## 2020-08-06 LAB — VITAMIN D 25 HYDROXY (VIT D DEFICIENCY, FRACTURES): Vit D, 25-Hydroxy: 31 ng/mL (ref 30–100)

## 2020-08-20 ENCOUNTER — Other Ambulatory Visit: Payer: BC Managed Care – PPO

## 2020-08-20 ENCOUNTER — Inpatient Hospital Stay: Admission: RE | Admit: 2020-08-20 | Payer: BC Managed Care – PPO | Source: Ambulatory Visit

## 2020-08-25 ENCOUNTER — Ambulatory Visit: Payer: BC Managed Care – PPO | Attending: Neurology

## 2020-08-25 DIAGNOSIS — G4733 Obstructive sleep apnea (adult) (pediatric): Secondary | ICD-10-CM | POA: Insufficient documentation

## 2020-08-26 ENCOUNTER — Other Ambulatory Visit: Payer: Self-pay

## 2020-09-02 ENCOUNTER — Other Ambulatory Visit: Payer: BC Managed Care – PPO

## 2020-09-28 ENCOUNTER — Other Ambulatory Visit: Payer: Self-pay | Admitting: Family Medicine

## 2020-09-28 DIAGNOSIS — Z1239 Encounter for other screening for malignant neoplasm of breast: Secondary | ICD-10-CM

## 2020-09-29 ENCOUNTER — Encounter: Payer: Self-pay | Admitting: Family Medicine

## 2020-10-12 ENCOUNTER — Other Ambulatory Visit: Payer: Self-pay

## 2020-10-12 ENCOUNTER — Ambulatory Visit (INDEPENDENT_AMBULATORY_CARE_PROVIDER_SITE_OTHER): Payer: BC Managed Care – PPO | Admitting: Family Medicine

## 2020-10-12 ENCOUNTER — Encounter: Payer: Self-pay | Admitting: Family Medicine

## 2020-10-12 VITALS — BP 120/90 | HR 63 | Temp 98.1°F | Resp 16 | Ht 63.0 in | Wt 215.7 lb

## 2020-10-12 DIAGNOSIS — E559 Vitamin D deficiency, unspecified: Secondary | ICD-10-CM

## 2020-10-12 DIAGNOSIS — Z23 Encounter for immunization: Secondary | ICD-10-CM

## 2020-10-12 DIAGNOSIS — G4730 Sleep apnea, unspecified: Secondary | ICD-10-CM | POA: Diagnosis not present

## 2020-10-12 DIAGNOSIS — F331 Major depressive disorder, recurrent, moderate: Secondary | ICD-10-CM

## 2020-10-12 DIAGNOSIS — R739 Hyperglycemia, unspecified: Secondary | ICD-10-CM

## 2020-10-12 DIAGNOSIS — G4710 Hypersomnia, unspecified: Secondary | ICD-10-CM

## 2020-10-12 DIAGNOSIS — R0602 Shortness of breath: Secondary | ICD-10-CM | POA: Diagnosis not present

## 2020-10-12 DIAGNOSIS — E785 Hyperlipidemia, unspecified: Secondary | ICD-10-CM

## 2020-10-12 DIAGNOSIS — F411 Generalized anxiety disorder: Secondary | ICD-10-CM

## 2020-10-12 NOTE — Progress Notes (Signed)
Name: Teresa Gallagher   MRN: [id number]    DOB: 1961/05/05   Date:10/12/2020       Progress Note  Subjective  Chief Complaint  Chief Complaint  Patient presents with  . Follow-up    HPI  Sleep Apnea: she was diagnosed with OSA in 2016, she is still using her old setting for CPAP, she went back for another titration study recently but has not seen the results. Reviewed the study with her, she states she had an appointment with Dr. Maxwell Caul but missed it because of work, she needs to be on 15 cmH2O, we will send the order. We gave her provigil, she states it helps but she has not stopped at pharmacy to pick it up   SOB/wheezing: she states that she has SOB with moderate activity( but doing better now ), she alsoused to have episodes ofwheezing when laughing - but that has resolved alsoshe has seen pulmonologist and cardiologist. Still has edema on legs intermittently and takes lasix and potassium prn. Seen by Dr. Rockey Situ in the pastShe is using Ventolin prn a few times a week and it seems to help   GAD/mild recurrent depression:she saw Dr. Shea Evans for the first time Nov 10 th, 2020. But switched to Dr. Toy Care. She is not doing well, Dr. Toy Care recommended time off work for one month, but she went to work after one week. Phq 9 is very high, she states she is not able to stay on task, she is missing deadlines, she feels overwhelmed but also feels guilty about not going to work because every one is short staffed. 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 She states when not at work she sleeps the whole time. She will contact Dr. Toy Care to fill out her FMLA forms. She states she has not been taking duloxetine daily, she just forgets to take it   Obesity:she has morbid obesity based on BMI above 35 , knee pain and OSA, also has OA and elevated BP She has been sleeping long hours, lack of motivation, likely gaining weight due to  depression    Patient Active Problem List   Diagnosis Date Noted  . Attention and concentration deficit 01/20/2020  . Alcohol use disorder, mild, in early remission 10/29/2019  . Old tear of meniscus of right knee 03/20/2019  . SOB (shortness of breath) 08/29/2017  . Episodic tension-type headache, not intractable 12/20/2016  . Rib pain on right side 11/27/2015  . History of blood transfusion 07/30/2015  . GAD (generalized anxiety disorder) 07/30/2015  . Chronic constipation 07/13/2015  . Insomnia 07/13/2015  . MDD (major depressive disorder), recurrent episode, mild ([place]) 07/13/2015  . Fibromyalgia syndrome 07/13/2015  . 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 Morbid obesity ([place]) 07/13/2015  . Hypersomnia with sleep apnea 07/13/2015  . Vitamin D deficiency 07/13/2015    Past Surgical History:  Procedure Laterality Date  . BUNIONECTOMY Right 11/07/2014   Dr. Hulda [facility]rey at [facility]   . CARPAL TUNNEL RELEASE Left 2012  . CESAREAN SECTION    . LAPAROSCOPIC HYSTERECTOMY     followed by 3 cuff repairs  . LIPOMA EXCISION  2012  . REFRACTIVE SURGERY Bilateral   . TONSILLECTOMY      Family History  Problem Relation Age of Onset  . Heart attack Father   . Alcohol abuse Father   . Bipolar disorder Brother   . Depression Brother   .  Bipolar disorder Brother   . Depression Brother   . Alcohol abuse Brother   . Heart attack Paternal Uncle   . COPD Sister        end stages    Social History   Tobacco Use  . Smoking status: Never Smoker  . Smokeless tobacco: Never Used  Substance Use Topics  . Alcohol use: Yes    Alcohol/week: 0.0 standard drinks    Comment: a bottle of wine a week. same with liquor     Current Outpatient Medications:  .  albuterol (VENTOLIN HFA) 108 (90 Base) MCG/ACT inhaler, Inhale 2 puffs into the lungs every 6 (six) hours as needed for wheezing or shortness of breath., Disp: 18 g, Rfl: 0 .  DULoxetine (CYMBALTA) 60 MG capsule, Take 1  capsule (60 mg total) by mouth daily., Disp: 30 capsule, Rfl: 1 .  furosemide (LASIX) 20 MG tablet, TAKE 1 TABLET (20 MG TOTAL) BY MOUTH AS NEEDED FOR FLUID OR EDEMA., Disp: 90 tablet, Rfl: 0 .  KLOR-CON M20 20 MEQ tablet, TAKE 1 TABLET (20 MEQ TOTAL) BY MOUTH DAILY AS NEEDED (TAKE WITH LASIX)., Disp: 90 tablet, Rfl: 0 .  modafinil (PROVIGIL) 200 MG tablet, Take 1 tablet (200 mg total) by mouth daily., Disp: 30 tablet, Rfl: 3 .  Multiple Vitamins-Minerals (ZINC PO), Take 1 tablet by mouth daily., Disp: , Rfl:  .  valACYclovir (VALTREX) 500 MG tablet, Take 1 tablet (500 mg total) by mouth daily. And twice daily for outbreaks, Disp: 115 tablet, Rfl: 1 .  Vitamin D, Ergocalciferol, (DRISDOL) 1.25 MG (50000 UNIT) CAPS capsule, Take 1 capsule (50,000 Units total) by mouth every 7 (seven) days., Disp: 12 capsule, Rfl: 1 .  Probiotic Product (PROBIOTIC PO), Take 1 tablet by mouth daily. (Patient not taking: Reported on 10/12/2020), Disp: , Rfl:   No Known Allergies  I personally reviewed active problem list, medication list, allergies, family history, social history, health maintenance with the patient/caregiver today.   ROS  Constitutional: Negative for fever or weight change.  Respiratory: Negative for cough and shortness of breath.   Cardiovascular: Negative for chest pain or palpitations.  Gastrointestinal: Negative for abdominal pain, no bowel changes.  Musculoskeletal: Negative for gait problem or joint swelling.  Skin: Negative for rash.  Neurological: Negative for dizziness or headache.  No other specific complaints in a complete review of systems (except as listed in HPI above).  Objective  Vitals:   10/12/20 1017  BP: 120/90  Pulse: 63  Resp: 16  Temp: 98.1 F (36.7 C)  SpO2: 99%  Weight: 215 lb 11.2 oz (97.8 kg)  Height: 5\' 3"  (1.6 m)    Body mass index is 38.21 kg/m.  Physical Exam   Constitutional: Patient appears well-developed and well-nourished. Obese  No  distress.  HEENT: head atraumatic, normocephalic, pupils equal and reactive to light,neck supple Cardiovascular: Normal rate, regular rhythm and normal heart sounds.  No murmur heard. No BLE edema. Pulmonary/Chest: Effort normal and breath sounds normal. No respiratory distress. Abdominal: Soft.  There is no tenderness. Psychiatric: Patient has a normal mood and affect. behavior is normal. Judgment and thought content normal. She got tearful during the visit    Recent Results (from the past 2160 hour(s))  Lipid panel     Status: Abnormal   Collection Time: 08/05/20 11:52 AM  Result Value Ref Range   Cholesterol 199 <200 mg/dL   HDL 56 > OR = 50 mg/dL   Triglycerides 123 <150 mg/dL  LDL Cholesterol (Calc) 120 (H) mg/dL (calc)    Comment: Reference range: <100 . Desirable range <100 mg/dL for primary prevention;   <70 mg/dL for patients with CHD or diabetic patients  with > or = 2 CHD risk factors. Marland Kitchen LDL-C is now calculated using the Martin-Hopkins  calculation, which is a validated novel method providing  better accuracy than the Friedewald equation in the  estimation of LDL-C.  Cresenciano Genre et al. Annamaria Helling. 9417;408(14): 2061-2068  (http://education.[facility].com/faq/FAQ164)    Total CHOL/HDL Ratio 3.6 <5.0 (calc)   Non-HDL Cholesterol (Calc) 143 (H) <130 mg/dL (calc)    Comment: For patients with diabetes plus 1 major ASCVD risk  factor, treating to a non-HDL-C goal of <100 mg/dL  (LDL-C of <70 mg/dL) is considered a therapeutic  option.   CBC with Differential/Platelet     Status: None   Collection Time: 08/05/20 11:52 AM  Result Value Ref Range   WBC 8.3 3.8 - 10.8 Thousand/uL   RBC 4.86 3.80 - 5.10 Million/uL   Hemoglobin 14.3 11.7 - 15.5 g/dL   HCT 42.2 35 - 45 %   MCV 86.8 80.0 - 100.0 fL   MCH 29.4 27.0 - 33.0 pg   MCHC 33.9 32.0 - 36.0 g/dL   RDW 14.2 11.0 - 15.0 %   Platelets 240 140 - 400 Thousand/uL   MPV 12.4 7.5 - 12.5 fL   Neutro Abs 5,810 1,500 - 7,800  cells/uL   Lymphs Abs 1,951 850 - 3,900 cells/uL   Absolute Monocytes 440 200 - 950 cells/uL   Eosinophils Absolute 66 15.0 - 500.0 cells/uL   Basophils Absolute 33 0.0 - 200.0 cells/uL   Neutrophils Relative % 70 %   Total Lymphocyte 23.5 %   Monocytes Relative 5.3 %   Eosinophils Relative 0.8 %   Basophils Relative 0.4 %  COMPLETE METABOLIC PANEL WITH GFR     Status: None   Collection Time: 08/05/20 11:52 AM  Result Value Ref Range   Glucose, Bld 99 65 - 99 mg/dL    Comment: .            Fasting reference interval .    BUN 21 7 - 25 mg/dL   Creat 0.99 0.50 - 1.05 mg/dL    Comment: For patients >15 years of age, the reference limit for Creatinine is approximately 13% higher for people identified as African-American. .    GFR, Est Non African American 62 > OR = 60 mL/min/1.19m2   GFR, Est African American 72 > OR = 60 mL/min/1.23m2   BUN/Creatinine Ratio NOT APPLICABLE 6 - 22 (calc)   Sodium 139 135 - 146 mmol/L   Potassium 4.2 3.5 - 5.3 mmol/L   Chloride 102 98 - 110 mmol/L   CO2 29 20 - 32 mmol/L   Calcium 9.9 8.6 - 10.4 mg/dL   Total Protein 6.9 6.1 - 8.1 g/dL   Albumin 4.4 3.6 - 5.1 g/dL   Globulin 2.5 1.9 - 3.7 g/dL (calc)   AG Ratio 1.8 1.0 - 2.5 (calc)   Total Bilirubin 0.8 0.2 - 1.2 mg/dL   Alkaline phosphatase (APISO) 70 37 - 153 U/L   AST 14 10 - 35 U/L   ALT 12 6 - 29 U/L  VITAMIN D 25 Hydroxy (Vit-D Deficiency, Fractures)     Status: None   Collection Time: 08/05/20 11:52 AM  Result Value Ref Range   Vit D, 25-Hydroxy 31 30 - 100 ng/mL    Comment: Vitamin D Status  25-OH Vitamin D: . Deficiency:                    <20 ng/mL Insufficiency:             20 - 29 ng/mL Optimal:                 > or = 30 ng/mL . For 25-OH Vitamin D testing on patients on  D2-supplementation and patients for whom quantitation  of D2 and D3 fractions is required, the [facility]ureD(TM) 25-OH VIT D, (D2,D3), LC/MS/MS is recommended: order  code [phone] (patients  >70yrs). See Note 1 . Note 1 . For additional information, please refer to  http://education.[facility].com/faq/FAQ199  (This link is being provided for informational/ educational purposes only.)   Hemoglobin A1c     Status: None   Collection Time: 08/05/20 11:52 AM  Result Value Ref Range   Hgb A1c MFr Bld 5.5 <5.7 % of total Hgb    Comment: For the purpose of screening for the presence of diabetes: . <5.7%       Consistent with the absence of diabetes 5.7-6.4%    Consistent with increased risk for diabetes             (prediabetes) > or =6.5%  Consistent with diabetes . This assay result is consistent with a decreased risk of diabetes. . Currently, no consensus exists regarding use of hemoglobin A1c for diagnosis of diabetes in children. . According to American Diabetes Association (ADA) guidelines, hemoglobin A1c <7.0% represents optimal control in non-pregnant diabetic patients. Different metrics may apply to specific patient populations.  Standards of Medical Care in Diabetes(ADA). .    Mean Plasma Glucose 111 (calc)   eAG (mmol/L) 6.2 (calc)     PHQ2/9: Depression screen [facility] 2/9 10/12/2020 08/05/2020 08/05/2020 06/10/2020 02/11/2020  Decreased Interest 3 3 0 3 3  Down, Depressed, Hopeless 2 2 0 2 3  PHQ - 2 Score 5 5 0 5 6  Altered sleeping 3 2 0 3 2  Tired, decreased energy 3 3 0 3 2  Change in appetite 3 2 0 2 3  Feeling bad or failure about yourself  2 1 0 1 1  Trouble concentrating 3 2 0 2 3  Moving slowly or fidgety/restless 3 3 0 1 3  Suicidal thoughts 1 0 0 0 0  PHQ-9 Score 23 18 0 17 20  Difficult doing work/chores Extremely dIfficult Very difficult - Very difficult Extremely dIfficult  Some recent data might be hidden    phq 9 is positive   Fall Risk: Fall Risk  10/12/2020 08/05/2020 06/10/2020 02/11/2020 11/07/2019  Falls in the past year? 0 0 0 0 1  Number falls in past yr: 0 0 0 0 0  Injury with Fall? 0 0 0 0 1  Comment - - - - Injury  her ligament in her left hand  Follow up - - - Falls evaluation completed -     Functional Status Survey: Is the patient deaf or have difficulty hearing?: No Does the patient have difficulty seeing, even when wearing glasses/contacts?: No Does the patient have difficulty concentrating, remembering, or making decisions?: Yes Does the patient have difficulty walking or climbing stairs?: Yes Does the patient have difficulty dressing or bathing?: No Does the patient have difficulty doing errands alone such as visiting a doctor's office or shopping?: No    Assessment & Plan  1. Moderate episode of recurrent major depressive disorder ([place])  Needs to  contact Dr. Toy Care   2. Hyperglycemia  Last labs reviewed   3. Dyslipidemia  Discussed healthy diet   4. Sleep apnea with hypersomnolence  - For home use only DME continuous positive airway pressure (CPAP)  5. GAD (generalized anxiety disorder)   6. Need for immunization against influenza  - Flu Vaccine QUAD 36+ mos IM  7. SOB (shortness of breath)  Using ventolin prn   8. Vitamin D deficiency  Taking supplementation

## 2020-10-21 NOTE — Progress Notes (Deleted)
Name: Teresa Gallagher   MRN: [id number]    DOB: 11-15-1961   Date:10/21/2020       Progress Note  Subjective  Chief Complaint  Follow up/Respiratory Issues  HPI Sleep Apnea: she was diagnosed with OSA in 2016, she is still using her old setting for CPAP, she went back for another titration study recently but has not seen the results. Reviewed the study with her, she states she had an appointment with Dr. Maxwell Caul but missed it because of work, she needs to be on 15 cmH2O, we will send the order. We gave her provigil, she states it helps but she has not stopped at pharmacy to pick it up   SOB/wheezing: she states that she has SOB with moderate activity( but doing better now ), she alsoused to have episodes ofwheezing when laughing - but that has resolved alsoshe has seen pulmonologist and cardiologist. Still has edema on legs intermittently and takes lasix and potassium prn. Seen by Dr. Rockey Situ in the pastShe is using Ventolin prn a few times a week and it seems to help   GAD/mild recurrent depression:she saw Dr. Shea Evans for the first time Nov 10 th, 2020. But switched to Dr. Toy Care. She is not doing well, Dr. Toy Care recommended time off work for one month, but she went to work after one week. Phq 9 is very high, she states she is not able to stay on task, she is missing deadlines, she feels overwhelmed but also feels guilty about not going to work because every one is short staffed. 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 She states when not at work she sleeps the whole time. She will contact Dr. Toy Care to fill out her FMLA forms. She states she has not been taking duloxetine daily, she just forgets to take it   Obesity:she has morbid obesity based on BMI above 35 , knee pain and OSA, also has OA and elevated BP She has been sleeping long hours, lack of motivation, likely gaining weight due to depression   *** Patient Active  Problem List   Diagnosis Date Noted  . Attention and concentration deficit 01/20/2020  . Alcohol use disorder, mild, in early remission 10/29/2019  . Old tear of meniscus of right knee 03/20/2019  . SOB (shortness of breath) 08/29/2017  . Episodic tension-type headache, not intractable 12/20/2016  . Rib pain on right side 11/27/2015  . History of blood transfusion 07/30/2015  . GAD (generalized anxiety disorder) 07/30/2015  . Chronic constipation 07/13/2015  . Insomnia 07/13/2015  . MDD (major depressive disorder), recurrent episode, mild ([place]) 07/13/2015  . Fibromyalgia syndrome 07/13/2015  . 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 Morbid obesity ([place]) 07/13/2015  . Hypersomnia with sleep apnea 07/13/2015  . Vitamin D deficiency 07/13/2015    Past Surgical History:  Procedure Laterality Date  . BUNIONECTOMY Right 11/07/2014   [facility] at [facility]   . CARPAL TUNNEL RELEASE Left 2012  . CESAREAN SECTION    . LAPAROSCOPIC HYSTERECTOMY     followed by 3 cuff repairs  . LIPOMA EXCISION  2012  . REFRACTIVE SURGERY Bilateral   . TONSILLECTOMY      Family History  Problem Relation Age of Onset  . Heart attack Father   . Alcohol abuse Father   . Bipolar disorder Brother   . Depression Brother   . Bipolar disorder Brother   .  Depression Brother   . Alcohol abuse Brother   . Heart attack Paternal Uncle   . COPD Sister        end stages    Social History   Tobacco Use  . Smoking status: Never Smoker  . Smokeless tobacco: Never Used  Substance Use Topics  . Alcohol use: Yes    Alcohol/week: 0.0 standard drinks    Comment: a bottle of wine a week. same with liquor     Current Outpatient Medications:  .  albuterol (VENTOLIN HFA) 108 (90 Base) MCG/ACT inhaler, Inhale 2 puffs into the lungs every 6 (six) hours as needed for wheezing or shortness of breath., Disp: 18 g, Rfl: 0 .  DULoxetine (CYMBALTA) 60 MG capsule, Take 1 capsule (60 mg total) by mouth  daily., Disp: 30 capsule, Rfl: 1 .  furosemide (LASIX) 20 MG tablet, TAKE 1 TABLET (20 MG TOTAL) BY MOUTH AS NEEDED FOR FLUID OR EDEMA., Disp: 90 tablet, Rfl: 0 .  KLOR-CON M20 20 MEQ tablet, TAKE 1 TABLET (20 MEQ TOTAL) BY MOUTH DAILY AS NEEDED (TAKE WITH LASIX)., Disp: 90 tablet, Rfl: 0 .  modafinil (PROVIGIL) 200 MG tablet, Take 1 tablet (200 mg total) by mouth daily., Disp: 30 tablet, Rfl: 3 .  Multiple Vitamins-Minerals (ZINC PO), Take 1 tablet by mouth daily., Disp: , Rfl:  .  valACYclovir (VALTREX) 500 MG tablet, Take 1 tablet (500 mg total) by mouth daily. And twice daily for outbreaks, Disp: 115 tablet, Rfl: 1 .  Vitamin D, Ergocalciferol, (DRISDOL) 1.25 MG (50000 UNIT) CAPS capsule, Take 1 capsule (50,000 Units total) by mouth every 7 (seven) days., Disp: 12 capsule, Rfl: 1  No Known Allergies  I personally reviewed {Reviewed:14835} with the patient/caregiver today.   ROS  ***  Objective  There were no vitals filed for this visit.  There is no height or weight on file to calculate BMI.  Physical Exam ***  Recent Results (from the past 2160 hour(s))  Lipid panel     Status: Abnormal   Collection Time: 08/05/20 11:52 AM  Result Value Ref Range   Cholesterol 199 <200 mg/dL   HDL 56 > OR = 50 mg/dL   Triglycerides 123 <150 mg/dL   LDL Cholesterol (Calc) 120 (H) mg/dL (calc)    Comment: Reference range: <100 . Desirable range <100 mg/dL for primary prevention;   <70 mg/dL for patients with CHD or diabetic patients  with > or = 2 CHD risk factors. Marland Kitchen LDL-C is now calculated using the Martin-Hopkins  calculation, which is a validated novel method providing  better accuracy than the Friedewald equation in the  estimation of LDL-C.  Cresenciano Genre et al. Annamaria Helling. 4403;474(25): 2061-2068  (http://education.[facility].com/faq/FAQ164)    Total CHOL/HDL Ratio 3.6 <5.0 (calc)   Non-HDL Cholesterol (Calc) 143 (H) <130 mg/dL (calc)    Comment: For patients with diabetes plus 1  major ASCVD risk  factor, treating to a non-HDL-C goal of <100 mg/dL  (LDL-C of <70 mg/dL) is considered a therapeutic  option.   CBC with Differential/Platelet     Status: None   Collection Time: 08/05/20 11:52 AM  Result Value Ref Range   WBC 8.3 3.8 - 10.8 Thousand/uL   RBC 4.86 3.80 - 5.10 Million/uL   Hemoglobin 14.3 11.7 - 15.5 g/dL   HCT 42.2 35 - 45 %   MCV 86.8 80.0 - 100.0 fL   MCH 29.4 27.0 - 33.0 pg   MCHC 33.9 32.0 - 36.0 g/dL  RDW 14.2 11.0 - 15.0 %   Platelets 240 140 - 400 Thousand/uL   MPV 12.4 7.5 - 12.5 fL   Neutro Abs 5,810 1,500 - 7,800 cells/uL   Lymphs Abs 1,951 850 - 3,900 cells/uL   Absolute Monocytes 440 200 - 950 cells/uL   Eosinophils Absolute 66 15.0 - 500.0 cells/uL   Basophils Absolute 33 0.0 - 200.0 cells/uL   Neutrophils Relative % 70 %   Total Lymphocyte 23.5 %   Monocytes Relative 5.3 %   Eosinophils Relative 0.8 %   Basophils Relative 0.4 %  COMPLETE METABOLIC PANEL WITH GFR     Status: None   Collection Time: 08/05/20 11:52 AM  Result Value Ref Range   Glucose, Bld 99 65 - 99 mg/dL    Comment: .            Fasting reference interval .    BUN 21 7 - 25 mg/dL   Creat 0.99 0.50 - 1.05 mg/dL    Comment: For patients >49 years of age, the reference limit for Creatinine is approximately 13% higher for people identified as African-American. .    GFR, Est Non African American 62 > OR = 60 mL/min/1.37m2   GFR, Est African American 72 > OR = 60 mL/min/1.44m2   BUN/Creatinine Ratio NOT APPLICABLE 6 - 22 (calc)   Sodium 139 135 - 146 mmol/L   Potassium 4.2 3.5 - 5.3 mmol/L   Chloride 102 98 - 110 mmol/L   CO2 29 20 - 32 mmol/L   Calcium 9.9 8.6 - 10.4 mg/dL   Total Protein 6.9 6.1 - 8.1 g/dL   Albumin 4.4 3.6 - 5.1 g/dL   Globulin 2.5 1.9 - 3.7 g/dL (calc)   AG Ratio 1.8 1.0 - 2.5 (calc)   Total Bilirubin 0.8 0.2 - 1.2 mg/dL   Alkaline phosphatase (APISO) 70 37 - 153 U/L   AST 14 10 - 35 U/L   ALT 12 6 - 29 U/L  VITAMIN D 25 Hydroxy  (Vit-D Deficiency, Fractures)     Status: None   Collection Time: 08/05/20 11:52 AM  Result Value Ref Range   Vit D, 25-Hydroxy 31 30 - 100 ng/mL    Comment: Vitamin D Status         25-OH Vitamin D: . Deficiency:                    <20 ng/mL Insufficiency:             20 - 29 ng/mL Optimal:                 > or = 30 ng/mL . For 25-OH Vitamin D testing on patients on  D2-supplementation and patients for whom quantitation  of D2 and D3 fractions is required, the QuestAssureD(TM) 25-OH VIT D, (D2,D3), LC/MS/MS is recommended: order  code [phone] (patients >41yrs). See Note 1 . Note 1 . For additional information, please refer to  http://education.[facility].com/faq/FAQ199  (This link is being provided for informational/ educational purposes only.)   Hemoglobin A1c     Status: None   Collection Time: 08/05/20 11:52 AM  Result Value Ref Range   Hgb A1c MFr Bld 5.5 <5.7 % of total Hgb    Comment: For the purpose of screening for the presence of diabetes: . <5.7%       Consistent with the absence of diabetes 5.7-6.4%    Consistent with increased risk for diabetes             (  prediabetes) > or =6.5%  Consistent with diabetes . This assay result is consistent with a decreased risk of diabetes. . Currently, no consensus exists regarding use of hemoglobin A1c for diagnosis of diabetes in children. . According to American Diabetes Association (ADA) guidelines, hemoglobin A1c <7.0% represents optimal control in non-pregnant diabetic patients. Different metrics may apply to specific patient populations.  Standards of Medical Care in Diabetes(ADA). .    Mean Plasma Glucose 111 (calc)   eAG (mmol/L) 6.2 (calc)    Diabetic Foot Exam: Diabetic Foot Exam - Simple   No data filed     ***  PHQ2/9: Depression screen [facility] 2/9 10/12/2020 08/05/2020 08/05/2020 06/10/2020 02/11/2020  Decreased Interest 3 3 0 3 3  Down, Depressed, Hopeless 2 2 0 2 3  PHQ - 2 Score 5 5 0 5 6   Altered sleeping 3 2 0 3 2  Tired, decreased energy 3 3 0 3 2  Change in appetite 3 2 0 2 3  Feeling bad or failure about yourself  2 1 0 1 1  Trouble concentrating 3 2 0 2 3  Moving slowly or fidgety/restless 3 3 0 1 3  Suicidal thoughts 1 0 0 0 0  PHQ-9 Score 23 18 0 17 20  Difficult doing work/chores Extremely dIfficult Very difficult - Very difficult Extremely dIfficult  Some recent data might be hidden    phq 9 is {gen pos IOE:703500} ***  Fall Risk: Fall Risk  10/12/2020 08/05/2020 06/10/2020 02/11/2020 11/07/2019  Falls in the past year? 0 0 0 0 1  Number falls in past yr: 0 0 0 0 0  Injury with Fall? 0 0 0 0 1  Comment - - - - Injury her ligament in her left hand  Follow up - - - Falls evaluation completed -   ***   Functional Status Survey:   ***   Assessment & Plan  *** There are no diagnoses linked to this encounter.

## 2020-10-22 ENCOUNTER — Telehealth: Payer: Self-pay

## 2020-10-22 NOTE — Telephone Encounter (Signed)
Pt was in office on 10.25.2021 and you guys had discussed shortness of breath. Pt is asking for a referral to be placed for this. An appointment was scheduled fro her to see you on tomorrow for referral. Since she seen you on the 25th does she still need this appointment. Pt really do not want to keep the appointment tomorrow. She would like to see if the nurse or someone could find out who she saw for the same thing about 94yrs ago.

## 2020-10-23 ENCOUNTER — Ambulatory Visit: Payer: BC Managed Care – PPO | Admitting: Family Medicine

## 2020-10-23 ENCOUNTER — Other Ambulatory Visit: Payer: Self-pay

## 2020-10-23 DIAGNOSIS — R0602 Shortness of breath: Secondary | ICD-10-CM

## 2020-10-24 ENCOUNTER — Other Ambulatory Visit: Payer: Self-pay | Admitting: Family Medicine

## 2020-10-24 DIAGNOSIS — R0602 Shortness of breath: Secondary | ICD-10-CM

## 2020-10-24 NOTE — Telephone Encounter (Signed)
Requested Prescriptions  Pending Prescriptions Disp Refills  . albuterol (VENTOLIN HFA) 108 (90 Base) MCG/ACT inhaler [Pharmacy Med Name: ALBUTEROL HFA (VENTOLIN) INH] 18 each 0    Sig: TAKE 2 PUFFS BY MOUTH EVERY 6 HOURS AS NEEDED FOR WHEEZE OR SHORTNESS OF BREATH     Pulmonology:  Beta Agonists Failed - 10/24/2020  5:49 PM      Failed - One inhaler should last at least one month. If the patient is requesting refills earlier, contact the patient to check for uncontrolled symptoms.      Passed - Valid encounter within last 12 months    Recent Outpatient Visits          1 week ago Moderate episode of recurrent major depressive disorder [facility])   [facility] Steele Sizer, MD   2 months ago Well adult exam   [facility] Steele Sizer, MD   4 months ago Moderate episode of recurrent major depressive disorder [facility])   [facility] Steele Sizer, MD   8 months ago MDD (major depressive disorder), recurrent episode, mild [facility])   [facility] Steele Sizer, MD   11 months ago Sleep apnea with hypersomnolence   [facility] Steele Sizer, MD      Future Appointments            In 1 month Steele Sizer, MD [facility], [place]   In 9 months Steele Sizer, MD [facility], [facility]

## 2020-11-09 ENCOUNTER — Ambulatory Visit: Payer: BC Managed Care – PPO | Admitting: Family Medicine

## 2020-11-11 ENCOUNTER — Ambulatory Visit
Admission: RE | Admit: 2020-11-11 | Discharge: 2020-11-11 | Disposition: A | Discharge status: Home / Self Care | Payer: BC Managed Care – PPO | Source: Ambulatory Visit | Attending: Family Medicine | Admitting: Family Medicine

## 2020-11-11 ENCOUNTER — Other Ambulatory Visit: Payer: Self-pay

## 2020-11-11 DIAGNOSIS — Z780 Asymptomatic menopausal state: Secondary | ICD-10-CM

## 2020-11-11 DIAGNOSIS — R29890 Loss of height: Secondary | ICD-10-CM

## 2020-11-21 ENCOUNTER — Other Ambulatory Visit: Payer: Self-pay | Admitting: Family Medicine

## 2020-11-21 DIAGNOSIS — B009 Herpesviral infection, unspecified: Secondary | ICD-10-CM

## 2020-11-21 NOTE — Telephone Encounter (Signed)
Requested Prescriptions  Pending Prescriptions Disp Refills  . valACYclovir (VALTREX) 500 MG tablet [Pharmacy Med Name: VALACYCLOVIR HCL 500 MG TABLET] 115 tablet 1    Sig: TAKE 1 TABLET (500 MG TOTAL) BY MOUTH DAILY. AND TWICE DAILY FOR OUTBREAKS     Antimicrobials:  Antiviral Agents - Anti-Herpetic Passed - 11/21/2020  8:59 AM      Passed - Valid encounter within last 12 months    Recent Outpatient Visits          1 month ago Moderate episode of recurrent major depressive disorder [facility])   [facility] Steele Sizer, MD   3 months ago Well adult exam   [facility] Steele Sizer, MD   5 months ago Moderate episode of recurrent major depressive disorder [facility])   [facility] Steele Sizer, MD   9 months ago MDD (major depressive disorder), recurrent episode, mild [facility])   [facility] Steele Sizer, MD   1 year ago Sleep apnea with hypersomnolence   [facility] Steele Sizer, MD      Future Appointments            In 1 week Steele Sizer, MD [facility], [place]   In 8 months Steele Sizer, MD [facility], [facility]

## 2020-11-30 ENCOUNTER — Ambulatory Visit: Payer: BC Managed Care – PPO | Admitting: Family Medicine

## 2020-12-02 NOTE — Progress Notes (Signed)
Name: Teresa Gallagher   MRN: [id number]    DOB: 11-03-1961   Date:12/03/2020       Progress Note  Subjective  Chief Complaint  Follow up   HPI   Sleep Apnea: she was diagnosed with OSA in 2016. Reviewed the last  study with her done in 2021 , she states she had an appointment with Dr. Maxwell Caul, she needs to be on 15 cmH2O. She has been wearing her old machine still, waiting for new one She is taking Provigil, still wakes up feeling tired.   SOB/wheezing: she states that she has SOB with moderate activity( but doing better now ), she alsoused to have episodes ofwheezing when laughing - but that has resolved alsoshe has seen pulmonologist and cardiologist. Still has edema on legs intermittently and takes lasix and potassium prn. She is going back to see pulmonologist soon.   GAD/Major  recurrent depression:she saw Dr. Shea Evans for the first time Nov 10 th, 2020. But switched to Dr. Toy Care. Since Nov 2021 she has been on FMLA as recommended  Dr. Toy Care and phq 9 is coming down. She is due to return to work on the new year but she does not feel like she will be able to go back. . She is still working part time through hospice, she states that her part time helps her happy. She states over the past few days she feels like her focus and motivation has been improving. She also sees therapist   Morbid Obesity :she has morbid obesity based on BMI above 35 ,  OSA, also has OA and dyslipidemia. She states since depression is improving she has moments that she gets more active, went to the park yesterday and walked for 2 hours. She is not sleeping all morning anymore She wants to resume going to the [facility] for exercise classes   Dyslipidemia: not on medication discussed healthy diet   The 10-year ASCVD risk score Mikey Bussing DC Brooke Bonito., et al., 2013) is: 4.2%   Values used to calculate the score:     Age: 58 years     Sex: Female     Is Non-Hispanic African American: Yes     Diabetic: No     Tobacco smoker:  No     Systolic Blood Pressure: 270 mmHg     Is BP treated: No     HDL Cholesterol: 56 mg/dL     Total Cholesterol: 199 mg/dL  Patient Active Problem List   Diagnosis Date Noted  . Attention and concentration deficit 01/20/2020  . Alcohol use disorder, mild, in early remission 10/29/2019  . Old tear of meniscus of right knee 03/20/2019  . SOB (shortness of breath) 08/29/2017  . Episodic tension-type headache, not intractable 12/20/2016  . Rib pain on right side 11/27/2015  . History of blood transfusion 07/30/2015  . GAD (generalized anxiety disorder) 07/30/2015  . Chronic constipation 07/13/2015  . Insomnia 07/13/2015  . MDD (major depressive disorder), recurrent episode, mild ([place]) 07/13/2015  . Fibromyalgia syndrome 07/13/2015  . 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 Morbid obesity ([place]) 07/13/2015  . Hypersomnia with sleep apnea 07/13/2015  . Vitamin D deficiency 07/13/2015    Past Surgical History:  Procedure Laterality Date  . BUNIONECTOMY Right 11/07/2014   Dr. Hulda Humphrey at [facility]   . CARPAL TUNNEL RELEASE Left 2012  . CESAREAN SECTION    . LAPAROSCOPIC HYSTERECTOMY     followed by 3 cuff repairs  . LIPOMA EXCISION  2012  .  REFRACTIVE SURGERY Bilateral   . TONSILLECTOMY      Family History  Problem Relation Age of Onset  . Heart attack Father   . Alcohol abuse Father   . Bipolar disorder Brother   . Depression Brother   . Bipolar disorder Brother   . Depression Brother   . Alcohol abuse Brother   . Heart attack Paternal Uncle   . COPD Sister        end stages    Social History   Tobacco Use  . Smoking status: Never Smoker  . Smokeless tobacco: Never Used  Substance Use Topics  . Alcohol use: Yes    Alcohol/week: 0.0 standard drinks    Comment: a bottle of wine a week. same with liquor     Current Outpatient Medications:  .  albuterol (VENTOLIN HFA) 108 (90 Base) MCG/ACT inhaler, TAKE 2 PUFFS BY MOUTH EVERY 6 HOURS AS NEEDED FOR  WHEEZE OR SHORTNESS OF BREATH, Disp: 18 each, Rfl: 1 .  DULoxetine (CYMBALTA) 60 MG capsule, Take 1 capsule (60 mg total) by mouth daily., Disp: 30 capsule, Rfl: 1 .  furosemide (LASIX) 20 MG tablet, TAKE 1 TABLET (20 MG TOTAL) BY MOUTH AS NEEDED FOR FLUID OR EDEMA., Disp: 90 tablet, Rfl: 0 .  KLOR-CON M20 20 MEQ tablet, TAKE 1 TABLET (20 MEQ TOTAL) BY MOUTH DAILY AS NEEDED (TAKE WITH LASIX)., Disp: 90 tablet, Rfl: 0 .  modafinil (PROVIGIL) 200 MG tablet, Take 1 tablet (200 mg total) by mouth daily., Disp: 30 tablet, Rfl: 3 .  Multiple Vitamins-Minerals (ZINC PO), Take 1 tablet by mouth daily., Disp: , Rfl:  .  valACYclovir (VALTREX) 500 MG tablet, TAKE 1 TABLET (500 MG TOTAL) BY MOUTH DAILY. AND TWICE DAILY FOR OUTBREAKS, Disp: 115 tablet, Rfl: 1 .  Vitamin D, Ergocalciferol, (DRISDOL) 1.25 MG (50000 UNIT) CAPS capsule, Take 1 capsule (50,000 Units total) by mouth every 7 (seven) days., Disp: 12 capsule, Rfl: 1  No Known Allergies  I personally reviewed active problem list, medication list, allergies, family history, social history, health maintenance with the patient/caregiver today.   ROS  Constitutional: Negative for fever or weight change.  Respiratory: Negative for cough and shortness of breath.   Cardiovascular: Negative for chest pain or palpitations.  Gastrointestinal: Negative for abdominal pain, no bowel changes.  Musculoskeletal: Negative for gait problem or joint swelling.  Skin: Negative for rash.  Neurological: Negative for dizziness or headache.  No other specific complaints in a complete review of systems (except as listed in HPI above).  Objective  Vitals:   12/03/20 0920  BP: 120/88  Pulse: 89  Resp: 16  Temp: 98.1 F (36.7 C)  SpO2: 100%  Weight: 214 lb 8 oz (97.3 kg)  Height: 5\' 3"  (1.6 m)    Body mass index is 38 kg/m.  Physical Exam  Constitutional: Patient appears well-developed and well-nourished. Obese  No distress.  HEENT: head atraumatic,  normocephalic, pupils equal and reactive to light,  neck supple Cardiovascular: Normal rate, regular rhythm and normal heart sounds.  No murmur heard. No BLE edema. Pulmonary/Chest: Effort normal and breath sounds normal. No respiratory distress. Abdominal: Soft.  There is no tenderness. Psychiatric: Patient has a normal mood and affect. behavior is normal. Judgment and thought content normal.  PHQ2/9: Depression screen [facility] 2/9 12/03/2020 10/12/2020 08/05/2020 08/05/2020 06/10/2020  Decreased Interest 2 3 3  0 3  Down, Depressed, Hopeless 1 2 2  0 2  PHQ - 2 Score 3 5 5  0 5  Altered sleeping 2 3 2  0 3  Tired, decreased energy 2 3 3  0 3  Change in appetite 2 3 2  0 2  Feeling bad or failure about yourself  1 2 1  0 1  Trouble concentrating 2 3 2  0 2  Moving slowly or fidgety/restless 1 3 3  0 1  Suicidal thoughts 0 1 0 0 0  PHQ-9 Score 13 23 18  0 17  Difficult doing work/chores Very difficult Extremely dIfficult Very difficult - Very difficult  Some recent data might be hidden    phq 9 is positive   Fall Risk: Fall Risk  12/03/2020 10/12/2020 08/05/2020 06/10/2020 02/11/2020  Falls in the past year? 0 0 0 0 0  Number falls in past yr: 0 0 0 0 0  Injury with Fall? 0 0 0 0 0  Comment - - - - -  Follow up - - - - Falls evaluation completed     Functional Status Survey: Is the patient deaf or have difficulty hearing?: No Does the patient have difficulty seeing, even when wearing glasses/contacts?: No Does the patient have difficulty concentrating, remembering, or making decisions?: Yes Does the patient have difficulty walking or climbing stairs?: No Does the patient have difficulty dressing or bathing?: No Does the patient have difficulty doing errands alone such as visiting a doctor's office or shopping?: No    Assessment & Plan  1. Dyslipidemia    2. Moderate episode of recurrent major depressive disorder (HCC)  Keep follow up with Dr. Toy Care   3. GAD (generalized anxiety  disorder)   4. Sleep apnea with hypersomnolence  - modafinil (PROVIGIL) 200 MG tablet; Take 1 tablet (200 mg total) by mouth daily.  Dispense: 30 tablet; Refill: 3  5. Vitamin D deficiency  - Vitamin D, Ergocalciferol, (DRISDOL) 1.25 MG (50000 UNIT) CAPS capsule; Take 1 capsule (50,000 Units total) by mouth every 7 (seven) days.  Dispense: 12 capsule; Refill: 1  6. Morbid obesity ([place])  Discussed with the patient the risk posed by an increased BMI. Discussed importance of portion control, calorie counting and at least 150 minutes of physical activity weekly. Avoid sweet beverages and drink more water. Eat at least 6 servings of fruit and vegetables daily   7. Primary osteoarthritis of right knee  Stable  8. Lower extremity edema  On prn medication

## 2020-12-03 ENCOUNTER — Encounter: Payer: Self-pay | Admitting: Family Medicine

## 2020-12-03 ENCOUNTER — Ambulatory Visit (INDEPENDENT_AMBULATORY_CARE_PROVIDER_SITE_OTHER): Payer: BC Managed Care – PPO | Admitting: Family Medicine

## 2020-12-03 ENCOUNTER — Other Ambulatory Visit: Payer: Self-pay

## 2020-12-03 VITALS — BP 120/88 | HR 89 | Temp 98.1°F | Resp 16 | Ht 63.0 in | Wt 214.5 lb

## 2020-12-03 DIAGNOSIS — F331 Major depressive disorder, recurrent, moderate: Secondary | ICD-10-CM

## 2020-12-03 DIAGNOSIS — E559 Vitamin D deficiency, unspecified: Secondary | ICD-10-CM

## 2020-12-03 DIAGNOSIS — F411 Generalized anxiety disorder: Secondary | ICD-10-CM | POA: Diagnosis not present

## 2020-12-03 DIAGNOSIS — E785 Hyperlipidemia, unspecified: Secondary | ICD-10-CM | POA: Diagnosis not present

## 2020-12-03 DIAGNOSIS — Z23 Encounter for immunization: Secondary | ICD-10-CM

## 2020-12-03 DIAGNOSIS — G4710 Hypersomnia, unspecified: Secondary | ICD-10-CM | POA: Diagnosis not present

## 2020-12-03 DIAGNOSIS — M1711 Unilateral primary osteoarthritis, right knee: Secondary | ICD-10-CM

## 2020-12-03 DIAGNOSIS — G4730 Sleep apnea, unspecified: Secondary | ICD-10-CM

## 2020-12-03 DIAGNOSIS — R600 Localized edema: Secondary | ICD-10-CM

## 2020-12-03 MED ORDER — MODAFINIL 200 MG PO TABS: 200.0000 mg | ORAL_TABLET | Freq: Every day | ORAL | 3 refills | Status: DC

## 2020-12-03 MED ORDER — VITAMIN D (ERGOCALCIFEROL) 1.25 MG (50000 UNIT) PO CAPS: 50000.0000 [IU] | ORAL_CAPSULE | ORAL | 1 refills | Status: DC

## 2020-12-31 ENCOUNTER — Institutional Professional Consult (permissible substitution): Payer: BC Managed Care – PPO | Admitting: Pulmonary Disease

## 2021-01-01 ENCOUNTER — Telehealth: Payer: Self-pay | Admitting: Family Medicine

## 2021-01-01 NOTE — Telephone Encounter (Signed)
Patient would like note emailed if possible - states that she doesn't feel able to travel at this current time.

## 2021-01-01 NOTE — Telephone Encounter (Signed)
Patient is still feeling unwell from [place] (tested 12/26/20) and would like documentation allowing her to remain out of work (patient is scheduled to return on 01/02/21) Patient is fatigued and experiencing a persistent mild headache

## 2021-01-06 NOTE — Telephone Encounter (Signed)
Pt is back at work and her job is telling her she does not need a work note at this time

## 2021-01-15 ENCOUNTER — Encounter: Payer: Self-pay | Admitting: Family Medicine

## 2021-01-28 ENCOUNTER — Ambulatory Visit (INDEPENDENT_AMBULATORY_CARE_PROVIDER_SITE_OTHER): Payer: 59 | Admitting: Pulmonary Disease

## 2021-01-28 ENCOUNTER — Other Ambulatory Visit: Payer: Self-pay

## 2021-01-28 ENCOUNTER — Encounter: Payer: Self-pay | Admitting: Pulmonary Disease

## 2021-01-28 VITALS — BP 132/78 | HR 98 | Temp 97.0°F | Ht 63.0 in | Wt 212.8 lb

## 2021-01-28 DIAGNOSIS — R0602 Shortness of breath: Secondary | ICD-10-CM | POA: Diagnosis not present

## 2021-01-28 DIAGNOSIS — E669 Obesity, unspecified: Secondary | ICD-10-CM

## 2021-01-28 DIAGNOSIS — G4733 Obstructive sleep apnea (adult) (pediatric): Secondary | ICD-10-CM

## 2021-01-28 NOTE — Progress Notes (Signed)
Subjective:    Patient ID: Teresa Gallagher, female    DOB: 1961-04-30, 60 y.o.   MRN: [id number] Chief Complaint  Patient presents with  . pulmonary consult    Per Dr. Rolly Salter with covid 12/2020. c/o sob with exertion/    HPI Teresa Gallagher is a 60 year old lifelong never smoker who presents for evaluation management of increasing dyspnea on exertion after COVID-19 infection in January 2022.  She is kindly referred by Dr. Steele Sizer.  She was previously evaluated by Dr. Ashby Dawes in August 2018 for slowly progressive exertional dyspnea.  She was also evaluated at that time by Dr. Rockey Situ and by Dr. Pierre Bali.  She was noted to have grade 1 diastolic dysfunction.  A cardiopulmonary stress test at that time did not show any clear evidence of cardiac limitation with a very mild FEV1 decrease postexercise but otherwise unremarkable.  At that time she was undergoing a lot of stress at work and was provided counseling with regards to how to control this issue.  The patient persisted with her symptoms but was able to adapt.  The weight loss has been recommended she has not been able to lose weight.  After COVID 19 in January 2022 she noted that her dyspnea on exertion was worse and that she had occasional wheezing noted.  She has also noted that her blood pressure and pulse have been elevated as well.  She notes significant reflux as well as occasional paroxysmal nocturnal dyspnea.  She also describes some lower extremity edema.  He has not had any fevers, chills or sweats.  She notes that occasionally may be albuterol helps her dyspnea but for the most part rest is what helps her the most.  She has been using albuterol 2-3 times per week without significant improvement in her symptoms.  She has obstructive sleep apnea and has been on CPAP for a number of years.  DATA: Echo 08/14/17 LVEF 55-60%, Grade 1 DD  CPX 08/16/17 Pre-Exercise PFTs  FVC 2.46 (82%)    FEV1 1.95 (81%)     FEV1/FVC 79  (99%)     MVV 95 (98%)  Post-Exercise PFTs ((from lowest post-exercise trial (%change from rest)) FVC performed IPE, 5, 10 mins FVC 2.33 (-5%) IPE    FEV1 1.88 (-3%)  IPE     FEV1/FVC 77 (-2%) 10 mins     Exercise Time:  14:45  Speed (mph): 3.0    Grade (%): 15.0   RPE: 17 Reason stopped: dyspnea (8/10)  Additional symptoms: None reported Resting HR: 94 Peak HR: 164  (100% age predicted max HR) BP rest: 106/82 BP peak: 174/96 Peak VO2: 22.6 (116% predicted peak VO2) VE/VCO2 slope: 28 OUES: 2.31 Peak RER: 1.03 Ventilatory Threshold: 16.2 (83% predicted and 72% measured peak VO2) Peak RR 50 Peak Ventilation: 64.3 VE/MVV: 68% PETCO2 at peak: 39 O2pulse: 13  (118% predicted O2pulse)   Review of Systems A 10 point review of systems was performed and it is as noted above otherwise negative.  Past Medical History:  Diagnosis Date  . Acne   . Arrhythmia   . Baker's cyst, right 10/2018   calf  . Blood transfusion without reported diagnosis   . Bunion   . Cervicalgia   . Chronic kidney disease    Kidney Stents  . Depression   . Herpes simplex without mention of complication   . Mastodynia   . Myalgia and myositis, unspecified   . Other acne   . Other malaise and fatigue   .  Painful respiration   . Palpitations   . Seborrhea capitis   . Sleep apnea in adult   . Symptomatic menopausal or female climacteric states   . Unspecified constipation   . Unspecified sleep apnea   . Unspecified vitamin D deficiency   . Vitamin D deficiency    Past Surgical History:  Procedure Laterality Date  . BUNIONECTOMY Right 11/07/2014   [facility] at [facility]   . CARPAL TUNNEL RELEASE Left 2012  . CESAREAN SECTION    . LAPAROSCOPIC HYSTERECTOMY     followed by 3 cuff repairs  . LIPOMA EXCISION  2012  . REFRACTIVE SURGERY Bilateral   . TONSILLECTOMY     Family History  Problem Relation Age of Onset  . Heart attack Father   . Alcohol abuse  Father   . Bipolar disorder Brother   . Depression Brother   . Bipolar disorder Brother   . Depression Brother   . Alcohol abuse Brother   . Heart attack Paternal Uncle   . COPD Sister        end stages   Social History   Tobacco Use  . Smoking status: Never Smoker  . Smokeless tobacco: Never Used  Substance Use Topics  . Alcohol use: Yes    Alcohol/week: 0.0 standard drinks    Comment: a bottle of wine a week. same with liquor   No Known Allergies Current Meds  Medication Sig  . albuterol (VENTOLIN HFA) 108 (90 Base) MCG/ACT inhaler TAKE 2 PUFFS BY MOUTH EVERY 6 HOURS AS NEEDED FOR WHEEZE OR SHORTNESS OF BREATH  . ARIPiprazole (ABILIFY) 2 MG tablet 2 tablets daily.  . DULoxetine (CYMBALTA) 60 MG capsule Take 1 capsule (60 mg total) by mouth daily.  . furosemide (LASIX) 20 MG tablet TAKE 1 TABLET (20 MG TOTAL) BY MOUTH AS NEEDED FOR FLUID OR EDEMA.  Marland Kitchen KLOR-CON M20 20 MEQ tablet TAKE 1 TABLET (20 MEQ TOTAL) BY MOUTH DAILY AS NEEDED (TAKE WITH LASIX).  Marland Kitchen modafinil (PROVIGIL) 200 MG tablet Take 1 tablet (200 mg total) by mouth daily.  . Multiple Vitamins-Minerals (ZINC PO) Take 1 tablet by mouth daily.  . valACYclovir (VALTREX) 500 MG tablet TAKE 1 TABLET (500 MG TOTAL) BY MOUTH DAILY. AND TWICE DAILY FOR OUTBREAKS  . Vitamin D, Ergocalciferol, (DRISDOL) 1.25 MG (50000 UNIT) CAPS capsule Take 1 capsule (50,000 Units total) by mouth every 7 (seven) days.   Immunization History  Administered Date(s) Administered  . Influenza Split 01/10/2013  . Influenza,inj,Quad PF,6+ Mos 09/29/2014, 11/05/2015, 11/25/2016, 10/17/2017, 08/16/2018, 10/12/2020  . Influenza-Unspecified 09/29/2014  . PFIZER(Purple Top)SARS-COV-2 Vaccination 02/15/2020, 03/07/2020  . Tdap 01/10/2013  . Zoster Recombinat (Shingrix) 12/03/2020       Objective:   Physical Exam BP 132/78 (BP Location: Left Arm, Cuff Size: Normal)   Pulse 98   Temp (!) 97 F (36.1 C) (Temporal)   Ht 5\' 3"  (1.6 m)   Wt 212 lb 12.8  oz (96.5 kg)   SpO2 99%   BMI 37.70 kg/m  GENERAL: Obese woman, no acute distress.  She is fully ambulatory.  Positional dyspnea. HEAD: Normocephalic, atraumatic.  EYES: Pupils equal, round, reactive to light.  No scleral icterus.  MOUTH: Nose/mouth/throat not examined due to masking requirements for COVID 19. NECK: Supple. No thyromegaly. Trachea midline. No JVD.  No adenopathy. PULMONARY: Good air entry bilaterally.  No adventitious sounds. CARDIOVASCULAR: S1 and S2. Regular rate and rhythm.  No rubs, murmurs or gallops heard. ABDOMEN: Obese otherwise benign.  MUSCULOSKELETAL: No joint deformity, no clubbing, no edema.  NEUROLOGIC: No focal deficit, no gait disturbance, speech is fluent. SKIN: Intact,warm,dry. PSYCH: Mood and behavior normal.    Assessment & Plan:     ICD-10-CM   1. Shortness of breath  R06.02 Pulmonary Function Test [facility] Only    ECHOCARDIOGRAM COMPLETE   Acute on chronic issue Obtain 2D echo and PFTs to evaluate  2. Obstructive sleep apnea syndrome  G47.33    On CPAP Compliant, appears to be compensated  3. Obesity, Class II, BMI 35-39.9, isolated (see actual BMI)  E66.9    This issue adds complexity to her management Will certainly aggravate her symptom of dyspnea   Orders Placed This Encounter  Procedures  . Pulmonary Function Test [facility] Only    Standing Status:   Future    Number of Occurrences:   1    Standing Expiration Date:   01/28/2022    Scheduling Instructions:     4 weeks    Order Specific Question:   Full PFT: includes the following: basic spirometry, spirometry pre & post bronchodilator, diffusion capacity (DLCO), lung volumes    Answer:   Full PFT  . ECHOCARDIOGRAM COMPLETE    Standing Status:   Future    Number of Occurrences:   1    Standing Expiration Date:   07/28/2021    Order Specific Question:   Where should this test be performed    Answer:   [facility]    Order Specific Question:   Perflutren DEFINITY (image enhancing agent)  should be administered unless hypersensitivity or allergy exist    Answer:   Administer Perflutren    Order Specific Question:   Reason for exam-Echo    Answer:   Dyspnea  R06.00    We will see the patient in follow-up in 4 to 6 weeks time after the above studies have been completed.  She is to contact us prior to that time should any new difficulties arise.  Renold Don, MD [facility]   *This note was dictated using voice recognition software/Dragon.  Despite best efforts to proofread, errors can occur which can change the meaning.  Any change was purely unintentional.

## 2021-01-28 NOTE — Patient Instructions (Signed)
We are ordering breathing tests to check your lungs and an echocardiogram to recheck your heart.  You may continue using albuterol as needed.  We will determine with the breathing tests if you need a different type of inhaler.  We will see you in follow-up in 4 to 6 weeks time call sooner should any new difficulties arise.

## 2021-02-01 ENCOUNTER — Other Ambulatory Visit: Payer: Self-pay | Admitting: Family Medicine

## 2021-02-01 DIAGNOSIS — Z1231 Encounter for screening mammogram for malignant neoplasm of breast: Secondary | ICD-10-CM

## 2021-02-04 ENCOUNTER — Ambulatory Visit (INDEPENDENT_AMBULATORY_CARE_PROVIDER_SITE_OTHER): Payer: 59

## 2021-02-04 ENCOUNTER — Other Ambulatory Visit: Payer: Self-pay

## 2021-02-04 DIAGNOSIS — R0602 Shortness of breath: Secondary | ICD-10-CM | POA: Diagnosis not present

## 2021-02-04 LAB — ECHOCARDIOGRAM COMPLETE
AR max vel: 2.2 cm2
AV Area VTI: 2.19 cm2
AV Area mean vel: 2.27 cm2
AV Mean grad: 5 mmHg
AV Peak grad: 8.6 mmHg
Ao pk vel: 1.47 m/s
Area-P 1/2: 3.48 cm2
Calc EF: 55.9 %
S' Lateral: 2.7 cm
Single Plane A2C EF: 56.3 %
Single Plane A4C EF: 53.3 %

## 2021-02-09 ENCOUNTER — Other Ambulatory Visit: Payer: Self-pay

## 2021-02-15 ENCOUNTER — Telehealth: Payer: Self-pay | Admitting: Pulmonary Disease

## 2021-02-15 NOTE — Telephone Encounter (Signed)
Patient is aware of date/time of covid test prior to PFT.  

## 2021-02-16 ENCOUNTER — Other Ambulatory Visit: Admission: RE | Admit: 2021-02-16 | Payer: BC Managed Care – PPO | Source: Ambulatory Visit

## 2021-02-17 ENCOUNTER — Other Ambulatory Visit: Payer: Self-pay

## 2021-02-17 ENCOUNTER — Ambulatory Visit: Payer: BC Managed Care – PPO | Attending: Pulmonary Disease

## 2021-02-17 DIAGNOSIS — R0602 Shortness of breath: Secondary | ICD-10-CM

## 2021-02-17 DIAGNOSIS — R062 Wheezing: Secondary | ICD-10-CM | POA: Diagnosis not present

## 2021-02-17 MED ORDER — ALBUTEROL SULFATE (2.5 MG/3ML) 0.083% IN NEBU
2.5000 mg | INHALATION_SOLUTION | Freq: Once | RESPIRATORY_TRACT | Status: AC
  Administered 2021-02-17: 2.5 mg via RESPIRATORY_TRACT
  Filled 2021-02-17: qty 3

## 2021-02-18 ENCOUNTER — Other Ambulatory Visit: Payer: Self-pay | Admitting: Orthopedic Surgery

## 2021-02-18 DIAGNOSIS — M25332 Other instability, left wrist: Secondary | ICD-10-CM

## 2021-02-19 LAB — PULMONARY FUNCTION TEST ARMC ONLY
DL/VA % pred: 121 %
DL/VA: 5.12 ml/min/mmHg/L
DLCO unc % pred: 93 %
DLCO unc: 18.82 ml/min/mmHg
FEF 25-75 Post: 1.97 L/sec
FEF 25-75 Pre: 2.26 L/sec
FEF2575-%Change-Post: -12 %
FEF2575-%Pred-Post: 95 %
FEF2575-%Pred-Pre: 109 %
FEV1-%Change-Post: -4 %
FEV1-%Pred-Post: 84 %
FEV1-%Pred-Pre: 88 %
FEV1-Post: 1.77 L
FEV1-Pre: 1.85 L
FEV1FVC-%Change-Post: 1 %
FEV1FVC-%Pred-Pre: 106 %
FEV6-%Change-Post: -5 %
FEV6-%Pred-Post: 80 %
FEV6-%Pred-Pre: 85 %
FEV6-Post: 2.06 L
FEV6-Pre: 2.19 L
FEV6FVC-%Pred-Post: 103 %
FEV6FVC-%Pred-Pre: 103 %
FVC-%Change-Post: -5 %
FVC-%Pred-Post: 77 %
FVC-%Pred-Pre: 82 %
FVC-Post: 2.06 L
Post FEV1/FVC ratio: 86 %
Post FEV6/FVC ratio: 100 %
Pre FEV1/FVC ratio: 84 %
Pre FEV6/FVC Ratio: 100 %
RV % pred: 72 %
RV: 1.44 L
TLC % pred: 78 %
TLC: 3.95 L

## 2021-03-11 ENCOUNTER — Ambulatory Visit: Payer: 59 | Admitting: Pulmonary Disease

## 2021-03-18 ENCOUNTER — Other Ambulatory Visit: Payer: BC Managed Care – PPO

## 2021-03-18 ENCOUNTER — Ambulatory Visit
Admission: RE | Admit: 2021-03-18 | Discharge: 2021-03-18 | Disposition: A | Discharge status: Home / Self Care | Payer: BC Managed Care – PPO | Source: Ambulatory Visit | Attending: Orthopedic Surgery | Admitting: Orthopedic Surgery

## 2021-03-18 DIAGNOSIS — M25332 Other instability, left wrist: Secondary | ICD-10-CM

## 2021-03-18 MED ORDER — IOPAMIDOL (ISOVUE-M 200) INJECTION 41%
1.5000 mL | Freq: Once | INTRAMUSCULAR | Status: AC
  Administered 2021-03-18: 1.5 mL via INTRA_ARTICULAR

## 2021-03-23 DIAGNOSIS — G4733 Obstructive sleep apnea (adult) (pediatric): Secondary | ICD-10-CM | POA: Insufficient documentation

## 2021-03-23 DIAGNOSIS — G4726 Circadian rhythm sleep disorder, shift work type: Secondary | ICD-10-CM | POA: Insufficient documentation

## 2021-03-23 NOTE — Progress Notes (Signed)
Name: Teresa Gallagher   MRN: [id number]    DOB: [birth year]   Date:03/24/2021       Progress Note  Subjective  Chief Complaint  CPAP Check Up  HPI   Sleep Apnea: she was diagnosed with OSA in 2016. Reviewed the last  study with her done in 2021 , she states she had an appointment with Dr. Maxwell Caul, she needs to be on 15 cmH2O. She has a new machine and seems to be working better for her, reviewed her data, and she has been wearing every night , usually around 8 hours per night.   SOB/wheezing: she states that she has SOB with moderate activity( but doing better now ), she alsoused to have episodes ofwheezing when laughing - but that has resolved alsoshe has seen pulmonologist and cardiologist. Still has edema on legs intermittently and takes lasix and potassium prn. She recently had a PFT but has not gone back to discuss results with Dr. Duwayne Heck yet. She states during the test she felt nervous while speaking to technician.   GAD/Major  recurrent depression:she saw Dr. Shea Evans for the first time Nov 10 th, 2020. But switched to Dr. Toy Care. Since Nov 2021 she has been on FMLA as recommended  Dr. Toy Care and phq 9 was coming down but is elevated again today.  She  does not feel like she will be able to go back, she states even thinking about work makes her feel overwhelmed again.  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 Phq 9 is higher . She is seeing therapist   Morbid Obesity :she has morbid obesity based on BMI above 35 ,  OSA, also has OA and dyslipidemia. She is trying to be more active to help her mood and to help with activity. Weight is stable , down 2 lbs   Dyslipidemia: not on medication discussed healthy diet , reviewed last labs with patient   Patient Active Problem List   Diagnosis Date Noted  . Circadian rhythm sleep disorder, shift work type 03/23/2021  . Obstructive sleep apnea syndrome 03/23/2021  . Attention and  concentration deficit 01/20/2020  . Alcohol use disorder, mild, in early remission 10/29/2019  . Old tear of meniscus of right knee 03/20/2019  . SOB (shortness of breath) 08/29/2017  . Episodic tension-type headache, not intractable 12/20/2016  . Rib pain on right side 11/27/2015  . History of blood transfusion 07/30/2015  . GAD (generalized anxiety disorder) 07/30/2015  . Chronic constipation 07/13/2015  . Insomnia 07/13/2015  . MDD (major depressive disorder), recurrent episode, mild ([place]) 07/13/2015  . Fibromyalgia syndrome 07/13/2015  . 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 Morbid obesity ([place]) 07/13/2015  . Hypersomnia with sleep apnea 07/13/2015  . Vitamin D deficiency 07/13/2015    Past Surgical History:  Procedure Laterality Date  . BUNIONECTOMY Right 11/07/2014   Dr. Hulda Humphrey at [facility]   . CARPAL TUNNEL RELEASE Left 2012  . CESAREAN SECTION    . LAPAROSCOPIC HYSTERECTOMY     followed by 3 cuff repairs  . LIPOMA EXCISION  2012  . REFRACTIVE SURGERY Bilateral   . TONSILLECTOMY      Family History  Problem Relation Age of Onset  . Heart attack Father   . Alcohol abuse Father   . Bipolar disorder Brother   . Depression Brother   . Bipolar disorder Brother   . Depression Brother   . Alcohol  abuse Brother   . Heart attack Paternal Uncle   . COPD Sister        end stages    Social History   Tobacco Use  . Smoking status: Never Smoker  . Smokeless tobacco: Never Used  Substance Use Topics  . Alcohol use: Yes    Alcohol/week: 0.0 standard drinks    Comment: a bottle of wine a week. same with liquor     Current Outpatient Medications:  .  albuterol (VENTOLIN HFA) 108 (90 Base) MCG/ACT inhaler, TAKE 2 PUFFS BY MOUTH EVERY 6 HOURS AS NEEDED FOR WHEEZE OR SHORTNESS OF BREATH, Disp: 18 each, Rfl: 1 .  ARIPiprazole (ABILIFY) 2 MG tablet, 2 tablets daily., Disp: , Rfl:  .  betamethasone acetate-betamethasone sodium phosphate (CELESTONE) 6 (3-3)  MG/ML injection, Inject into the articular space., Disp: , Rfl:  .  DULoxetine (CYMBALTA) 60 MG capsule, Take 1 capsule (60 mg total) by mouth daily., Disp: 30 capsule, Rfl: 1 .  furosemide (LASIX) 20 MG tablet, TAKE 1 TABLET (20 MG TOTAL) BY MOUTH AS NEEDED FOR FLUID OR EDEMA., Disp: 90 tablet, Rfl: 0 .  KLOR-CON M20 20 MEQ tablet, TAKE 1 TABLET (20 MEQ TOTAL) BY MOUTH DAILY AS NEEDED (TAKE WITH LASIX)., Disp: 90 tablet, Rfl: 0 .  lidocaine (XYLOCAINE) 1 % (with preservative) injection, by Infiltration route., Disp: , Rfl:  .  Melatonin 10 MG TABS, , Disp: , Rfl:  .  modafinil (PROVIGIL) 200 MG tablet, Take 1 tablet (200 mg total) by mouth daily., Disp: 30 tablet, Rfl: 3 .  Multiple Vitamins-Minerals (ZINC PO), Take 1 tablet by mouth daily., Disp: , Rfl:  .  valACYclovir (VALTREX) 500 MG tablet, TAKE 1 TABLET (500 MG TOTAL) BY MOUTH DAILY. AND TWICE DAILY FOR OUTBREAKS, Disp: 115 tablet, Rfl: 1 .  Vitamin D, Ergocalciferol, (DRISDOL) 1.25 MG (50000 UNIT) CAPS capsule, Take 1 capsule (50,000 Units total) by mouth every 7 (seven) days., Disp: 12 capsule, Rfl: 1 .  VUITY 1.25 % SOLN, Instill 1 drop into both eyes once a day, Disp: , Rfl:   No Known Allergies  I personally reviewed active problem list, medication list, allergies, family history, social history, health maintenance with the patient/caregiver today.   ROS  Constitutional: Negative for fever or weight change.  Respiratory: Negative for cough and shortness of breath.   Cardiovascular: Negative for chest pain or palpitations.  Gastrointestinal: Negative for abdominal pain, no bowel changes.  Musculoskeletal: Negative for gait problem or joint swelling.  Skin: Negative for rash.  Neurological: Negative for dizziness or headache.  No other specific complaints in a complete review of systems (except as listed in HPI above).  Objective  Vitals:   03/24/21 0835  BP: 122/76  Pulse: 89  Resp: 16  Temp: 98.1 F (36.7 C)  TempSrc:  Oral  SpO2: 98%  Weight: 212 lb (96.2 kg)  Height: 5\' 5"  (1.651 m)    Body mass index is 35.28 kg/m.  Physical Exam  Constitutional: Patient appears well-developed and well-nourished. Obese  No distress.  HEENT: head atraumatic, normocephalic, pupils equal and reactive to light,  neck supple Cardiovascular: Normal rate, regular rhythm and normal heart sounds.  No murmur heard. Non pitting  BLE edema. Pulmonary/Chest: Effort normal and breath sounds normal. No respiratory distress. Abdominal: Soft.  There is no tenderness. Psychiatric: Patient has a normal mood and affect. behavior is normal. Judgment and thought content normal.  Recent Results (from the past 2160 hour(s))  ECHOCARDIOGRAM COMPLETE  Status: None   Collection Time: 02/04/21  8:03 AM  Result Value Ref Range   AR max vel 2.20 cm2   AV Peak grad 8.6 mmHg   Ao pk vel 1.47 m/s   S' Lateral 2.70 cm   Area-P 1/2 3.48 cm2   AV Area VTI 2.19 cm2   AV Mean grad 5.0 mmHg   Single Plane A4C EF 53.3 %   Single Plane A2C EF 56.3 %   Calc EF 55.9 %   AV Area mean vel 2.27 cm2  Pulmonary Function Test [facility] Only     Status: None   Collection Time: 02/17/21  3:00 PM  Result Value Ref Range   FVC-%Pred-Pre 82 %   FVC-Post 2.06 L   FVC-%Pred-Post 77 %   FVC-%Change-Post -5 %   FEV1-Pre 1.85 L   FEV1-%Pred-Pre 88 %   FEV1-Post 1.77 L   FEV1-%Pred-Post 84 %   FEV1-%Change-Post -4 %   FEV6-Pre 2.19 L   FEV6-%Pred-Pre 85 %   FEV6-Post 2.06 L   FEV6-%Pred-Post 80 %   FEV6-%Change-Post -5 %   Pre FEV1/FVC ratio 84 %   FEV1FVC-%Pred-Pre 106 %   Post FEV1/FVC ratio 86 %   FEV1FVC-%Change-Post 1 %   Pre FEV6/FVC Ratio 100 %   FEV6FVC-%Pred-Pre 103 %   Post FEV6/FVC ratio 100 %   FEV6FVC-%Pred-Post 103 %   FEF 25-75 Pre 2.26 L/sec   FEF2575-%Pred-Pre 109 %   FEF 25-75 Post 1.97 L/sec   FEF2575-%Pred-Post 95 %   FEF2575-%Change-Post -12 %   RV 1.44 L   RV % pred 72 %   TLC 3.95 L   TLC % pred 78 %   DLCO unc 18.82  ml/min/mmHg   DLCO unc % pred 93 %   DL/VA 5.12 ml/min/mmHg/L   DL/VA % pred 121 %     PHQ2/9: Depression screen [facility] 2/9 03/24/2021 12/03/2020 10/12/2020 08/05/2020 08/05/2020  Decreased Interest 3 2 3 3  0  Down, Depressed, Hopeless 2 1 2 2  0  PHQ - 2 Score 5 3 5 5  0  Altered sleeping 2 2 3 2  0  Tired, decreased energy 3 2 3 3  0  Change in appetite 2 2 3 2  0  Feeling bad or failure about yourself  2 1 2 1  0  Trouble concentrating 3 2 3 2  0  Moving slowly or fidgety/restless 0 1 3 3  0  Suicidal thoughts 0 0 1 0 0  PHQ-9 Score 17 13 23 18  0  Difficult doing work/chores - Very difficult Extremely dIfficult Very difficult -  Some recent data might be hidden    phq 9 is positive   Fall Risk: Fall Risk  03/24/2021 12/03/2020 10/12/2020 08/05/2020 06/10/2020  Falls in the past year? 1 0 0 0 0  Number falls in past yr: 1 0 0 0 0  Injury with Fall? 1 0 0 0 0  Comment L wrist - - - -  Follow up - - - - -     Functional Status Survey: Is the patient deaf or have difficulty hearing?: No Does the patient have difficulty seeing, even when wearing glasses/contacts?: No Does the patient have difficulty concentrating, remembering, or making decisions?: Yes Does the patient have difficulty walking or climbing stairs?: No Does the patient have difficulty dressing or bathing?: No Does the patient have difficulty doing errands alone such as visiting a doctor's office or shopping?: No   Assessment & Plan  1. Dyslipidemia   2. Moderate  episode of recurrent major depressive disorder ([place])  Keep follow up with therapist and psychiatrist   3. Sleep apnea with hypersomnolence  - modafinil (PROVIGIL) 200 MG tablet; Take 1 tablet (200 mg total) by mouth daily.  Dispense: 30 tablet; Refill: 3  4. GAD (generalized anxiety disorder)  Stable  5. Morbid obesity ([place])  Discussed with the patient the risk posed by an increased BMI. Discussed importance of portion control, calorie counting and at  least 150 minutes of physical activity weekly. Avoid sweet beverages and drink more water. Eat at least 6 servings of fruit and vegetables daily   6. Vitamin D deficiency  Continue supplementation   7. Lower extremity edema  - furosemide (LASIX) 20 MG tablet; Take 1 tablet (20 mg total) by mouth as needed for fluid or edema.  Dispense: 90 tablet; Refill: 0  8. Primary osteoarthritis of right knee   9. SOB (shortness of breath)  Improved

## 2021-03-24 ENCOUNTER — Other Ambulatory Visit: Payer: Self-pay

## 2021-03-24 ENCOUNTER — Ambulatory Visit: Payer: BC Managed Care – PPO | Admitting: Family Medicine

## 2021-03-24 ENCOUNTER — Encounter: Payer: Self-pay | Admitting: Family Medicine

## 2021-03-24 VITALS — BP 122/76 | HR 89 | Temp 98.1°F | Resp 16 | Ht 65.0 in | Wt 212.0 lb

## 2021-03-24 DIAGNOSIS — E785 Hyperlipidemia, unspecified: Secondary | ICD-10-CM | POA: Diagnosis not present

## 2021-03-24 DIAGNOSIS — G4710 Hypersomnia, unspecified: Secondary | ICD-10-CM | POA: Diagnosis not present

## 2021-03-24 DIAGNOSIS — E559 Vitamin D deficiency, unspecified: Secondary | ICD-10-CM

## 2021-03-24 DIAGNOSIS — F331 Major depressive disorder, recurrent, moderate: Secondary | ICD-10-CM

## 2021-03-24 DIAGNOSIS — F411 Generalized anxiety disorder: Secondary | ICD-10-CM | POA: Diagnosis not present

## 2021-03-24 DIAGNOSIS — G4730 Sleep apnea, unspecified: Secondary | ICD-10-CM

## 2021-03-24 DIAGNOSIS — R0602 Shortness of breath: Secondary | ICD-10-CM

## 2021-03-24 DIAGNOSIS — R600 Localized edema: Secondary | ICD-10-CM

## 2021-03-24 DIAGNOSIS — M1711 Unilateral primary osteoarthritis, right knee: Secondary | ICD-10-CM

## 2021-03-24 MED ORDER — FUROSEMIDE 20 MG PO TABS: 20.0000 mg | ORAL_TABLET | ORAL | 0 refills | Status: DC | PRN

## 2021-03-24 MED ORDER — MODAFINIL 200 MG PO TABS
200.0000 mg | ORAL_TABLET | Freq: Every day | ORAL | 3 refills | Status: DC
Start: 2021-03-24 — End: 2022-02-21

## 2021-04-09 ENCOUNTER — Encounter: Payer: Self-pay | Admitting: Pulmonary Disease

## 2021-04-27 ENCOUNTER — Encounter: Payer: Self-pay | Admitting: Pulmonary Disease

## 2021-04-27 ENCOUNTER — Other Ambulatory Visit: Payer: Self-pay

## 2021-04-27 ENCOUNTER — Ambulatory Visit: Payer: BC Managed Care – PPO | Admitting: Pulmonary Disease

## 2021-04-27 VITALS — BP 124/78 | HR 94 | Temp 97.5°F | Ht 65.0 in | Wt 210.0 lb

## 2021-04-27 DIAGNOSIS — I272 Pulmonary hypertension, unspecified: Secondary | ICD-10-CM | POA: Diagnosis not present

## 2021-04-27 DIAGNOSIS — R0602 Shortness of breath: Secondary | ICD-10-CM | POA: Diagnosis not present

## 2021-04-27 DIAGNOSIS — G4733 Obstructive sleep apnea (adult) (pediatric): Secondary | ICD-10-CM | POA: Diagnosis not present

## 2021-04-27 NOTE — Progress Notes (Signed)
Subjective:    Patient ID: Teresa Gallagher, female    DOB: Apr 01, 1961, 60 y.o.   MRN: [id number]  HPI Teresa Gallagher is a 60 year old lifelong never smoker who follows for the issue of increasing dyspnea on exertion after COVID-19 infection in January 2022.  I initially evaluated her on 28 January 2021.  She has previously been evaluated by Dr. Ashby Dawes in August 2018 for slowly progressive exertional dyspnea.  She was also evaluated at that time by Dr. Rockey Situ and by Dr. Pierre Bali.  At that time her dyspnea was believed to be secondary to increased stress and weight.  He had very mild post exercise FEV1 decreased but not of significance.  As noted after COVID-19 in January 2022 she noted dyspnea on exertion that was worse and also noted some occasional wheezing.  She notes improvement with albuterol.  2D echo was performed on 04 February 2021 which shows mild increased right-sided pressures.  PFTs were performed on 17 February 2021 these show very mild restriction likely secondary to obesity with no bronchodilator response.  Diffusion capacity normal.  The studies were discussed with the patient. Though she does not have any response to bronchodilators she insists that albuterol helps her though not for prolonged period time.  Over the last 10 to 14 days she feels that she has been slowly getting somewhat better.  Feels that dyspnea is improved somewhat though continues to have issues.  She does not endorse orthopnea or paroxysmal nocturnal dyspnea.  No lower extremity edema.  Patient has obstructive sleep apnea, she follows with Dr. Maxwell Caul for this.  She is due to have a follow-up with him soon.  She has not noted any major issues with her CPAP.   Review of Systems A 10 point review of systems was performed and it is as noted above otherwise negative.  Patient Active Problem List   Diagnosis Date Noted  . Circadian rhythm sleep disorder, shift work type 03/23/2021  . Obstructive sleep apnea  syndrome 03/23/2021  . Attention and concentration deficit 01/20/2020  . Alcohol use disorder, mild, in early remission 10/29/2019  . Old tear of meniscus of right knee 03/20/2019  . SOB (shortness of breath) 08/29/2017  . Episodic tension-type headache, not intractable 12/20/2016  . Rib pain on right side 11/27/2015  . History of blood transfusion 07/30/2015  . GAD (generalized anxiety disorder) 07/30/2015  . Chronic constipation 07/13/2015  . Insomnia 07/13/2015  . MDD (major depressive disorder), recurrent episode, mild ([place]) 07/13/2015  . Fibromyalgia syndrome 07/13/2015  . 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 Morbid obesity ([place]) 07/13/2015  . Hypersomnia with sleep apnea 07/13/2015  . Vitamin D deficiency 07/13/2015   Social History   Tobacco Use  . Smoking status: Never Smoker  . Smokeless tobacco: Never Used  Substance Use Topics  . Alcohol use: Yes    Alcohol/week: 0.0 standard drinks    Comment: a bottle of wine a week. same with liquor   No Known Allergies  Current Meds  Medication Sig  . albuterol (VENTOLIN HFA) 108 (90 Base) MCG/ACT inhaler TAKE 2 PUFFS BY MOUTH EVERY 6 HOURS AS NEEDED FOR WHEEZE OR SHORTNESS OF BREATH  . ARIPiprazole (ABILIFY) 2 MG tablet 2 tablets daily.  . betamethasone acetate-betamethasone sodium phosphate (CELESTONE) 6 (3-3) MG/ML injection Inject into the articular space.  . DULoxetine (CYMBALTA) 60 MG capsule Take 1 capsule (60 mg total) by mouth daily.  . furosemide (LASIX) 20 MG tablet Take 1 tablet (20 mg  total) by mouth as needed for fluid or edema.  Marland Kitchen KLOR-CON M20 20 MEQ tablet TAKE 1 TABLET (20 MEQ TOTAL) BY MOUTH DAILY AS NEEDED (TAKE WITH LASIX).  Marland Kitchen lidocaine (XYLOCAINE) 1 % (with preservative) injection by Infiltration route.  . Melatonin 10 MG TABS   . modafinil (PROVIGIL) 200 MG tablet Take 1 tablet (200 mg total) by mouth daily.  . Multiple Vitamins-Minerals (ZINC PO) Take 1 tablet by mouth daily.  . valACYclovir  (VALTREX) 500 MG tablet TAKE 1 TABLET (500 MG TOTAL) BY MOUTH DAILY. AND TWICE DAILY FOR OUTBREAKS  . Vitamin D, Ergocalciferol, (DRISDOL) 1.25 MG (50000 UNIT) CAPS capsule Take 1 capsule (50,000 Units total) by mouth every 7 (seven) days.  . VUITY 1.25 % SOLN Instill 1 drop into both eyes once a day   Immunization History  Administered Date(s) Administered  . Influenza Split 01/10/2013  . Influenza,inj,Quad PF,6+ Mos 09/29/2014, 11/05/2015, 11/25/2016, 10/17/2017, 08/16/2018, 10/12/2020  . Influenza-Unspecified 09/29/2014  . PFIZER(Purple Top)SARS-COV-2 Vaccination 02/15/2020, 03/07/2020  . Tdap 01/10/2013  . Zoster Recombinat (Shingrix) 12/03/2020       Objective:   Physical Exam BP 124/78 (BP Location: Left Arm, Cuff Size: Normal)   Pulse 94   Temp (!) 97.5 F (36.4 C) (Temporal)   Ht 5\' 5"  (1.651 m)   Wt 210 lb (95.3 kg)   SpO2 100%   BMI 34.95 kg/m  GENERAL: Obese woman, no acute distress.  She is fully ambulatory.  Positional dyspnea. HEAD: Normocephalic, atraumatic.  EYES: Pupils equal, round, reactive to light.  No scleral icterus.  MOUTH: Nose/mouth/throat not examined due to masking requirements for COVID 19. NECK: Supple. No thyromegaly. Trachea midline. No JVD.  No adenopathy. PULMONARY: Good air entry bilaterally.  No adventitious sounds. CARDIOVASCULAR: S1 and S2. Regular rate and rhythm.  No rubs, murmurs or gallops heard. ABDOMEN: Obese otherwise benign. MUSCULOSKELETAL: No joint deformity, no clubbing, no edema.  NEUROLOGIC: No focal deficit, no gait disturbance, speech is fluent. SKIN: Intact,warm,dry. PSYCH: Mood and behavior normal.     Assessment & Plan:     ICD-10-CM   1. Pulmonary hypertension (HCC)  I27.20 AMB referral to CHF clinic   Query exercise right heart cath Echocardiogram can underestimate PAH Will have Dr. Haroldine Laws reevaluate  2. Shortness of breath  R06.02 AMB referral to CHF clinic   Mild improvement over last 10  days Persistent PFTs nonrevealing  3. Obstructive sleep apnea syndrome  G47.33    Continue follow-up with Dr. Nehemiah Settle   Orders Placed This Encounter  Procedures  . AMB referral to CHF clinic    Referral Priority:   Routine    Referral Type:   Consultation    Number of Visits Requested:   1   We will have the patient reevaluated by Dr. Pierre Bali.  Query if she may benefit from an exercise right heart cath to further delve into her dyspnea issues.  We will see her here in follow-up in 6 months time she is to contact us prior to that time should any new difficulties arise.  Renold Don, MD [facility]   *This note was dictated using voice recognition software/Dragon.  Despite best efforts to proofread, errors can occur which can change the meaning.  Any change was purely unintentional.

## 2021-04-27 NOTE — Patient Instructions (Signed)
You may resume albuterol as needed if you feel it helps you.  I will refer you to Dr. Haroldine Laws for another reevaluation as you may need a right-sided cath and further exercise testing measuring right-sided pressures.  I will see you in follow-up in 6 months time call sooner should any new difficulties arise.

## 2021-05-31 ENCOUNTER — Other Ambulatory Visit: Payer: Self-pay | Admitting: Family Medicine

## 2021-05-31 DIAGNOSIS — E559 Vitamin D deficiency, unspecified: Secondary | ICD-10-CM

## 2021-06-28 ENCOUNTER — Other Ambulatory Visit: Payer: Self-pay | Admitting: Family Medicine

## 2021-06-28 DIAGNOSIS — R600 Localized edema: Secondary | ICD-10-CM

## 2021-07-14 ENCOUNTER — Telehealth: Payer: Self-pay | Admitting: Cardiovascular Disease

## 2021-07-14 NOTE — Telephone Encounter (Signed)
Received records request from [employer], forwarded to [place]

## 2021-07-29 ENCOUNTER — Telehealth: Payer: Self-pay | Admitting: Family Medicine

## 2021-07-29 NOTE — Telephone Encounter (Unsigned)
Pt would like to know if Dr Ancil Boozer will call her in some ear drops for right ear pain. It has been hurting for 7 days now.  Pt has done OTC meds, and nothing helps. There is no drainage, no eye, nose, cough, just the ear pain. 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

## 2021-07-30 NOTE — Telephone Encounter (Signed)
Pt aware and will schedule appt

## 2021-08-05 NOTE — Progress Notes (Signed)
Name: Teresa Gallagher   MRN: [id number]    DOB: September 09, 1961   Date:08/09/2021       Progress Note  Subjective  Chief Complaint  Annual Exam  HPI  Patient presents for annual CPE and follow up. She is aware of additional cost   Sleep Apnea: she was diagnosed with OSA in 2016. Reviewed the last  study with her done in 2021 , she states she had an appointment with Dr. Maxwell Caul - sleep medicine  she needs to be on 15 cm H2O. She was recently seen by Dr. Duwayne Heck ( pulmonologist) and had an Echo done that showed increase of pulmonary pressure and was advised to follow up with Dr. Haroldine Laws - appointment in September.    SOB/wheezing: seen by pulmonologist and cardiologist, wearing CPAP , she feels like wheezing and sob has improved, she states lasix seems to help with fluid retention.    GAD/Major  recurrent depression: she saw Dr. Shea Evans for the first time Nov 10 th, 2020. But switched to Dr. Toy Care. Since Nov 2021 she has been on FMLA as recommended  Dr. Toy Care , she is still feeling anxious, she takes care of her grown son that has Down's syndrome. She is not sure if she is able to go back to work. Her 45 yo grand-daughter moved in, she is home schools. It is stressful but has been helpful to have her at the house    Obesity: BMI is now below 35, she has been losing some weight.   OSA, also has OA and dyslipidemia.   Hyperglycemia:    Dyslipidemia: not on medication discussed healthy diet , reviewed last labs with patient , we will recheck labs today   Diet: trying to eat a balanced diet  Exercise: she needs to increase physical activity   [place] Office Visit from 08/09/2021 in [facility]  AUDIT-C Score 2      Depression: Phq 9 is  positive Depression screen [facility] 2/9 08/09/2021 03/24/2021 12/03/2020 10/12/2020 08/05/2020  Decreased Interest _0 Down, Depressed, Hopeless _1 PHQ - 2 Score _2 Altered sleeping _3 Tired, decreased energy _4 Change in appetite _5 Feeling bad or failure about yourself  _6 Trouble concentrating _7 Moving slowly or fidgety/restless 1 0 _8 Suicidal thoughts 0 0 0 1 0  PHQ-9 Score _9 Difficult doing work/chores Very difficult - Very difficult Extremely dIfficult Very difficult  Some recent data might be hidden   Hypertension: BP Readings from Last 3 Encounters:  08/09/21 118/82  04/27/21 124/78  03/24/21 122/76   Obesity: Wt Readings from Last 3 Encounters:  08/09/21 206 lb (93.4 kg)  04/27/21 210 lb (95.3 kg)  03/24/21 212 lb (96.2 kg)   BMI Readings from Last 3 Encounters:  08/09/21 34.28 kg/m  04/27/21 34.95 kg/m  03/24/21 35.28 kg/m     Vaccines:   Shingrix: 39-64 yo and ask insurance if covered when patient above 61 yo Pneumonia: educated and discussed with patient. Flu: educated and discussed with patient.  Hep C Screening: 09/18/18 STD testing and prevention (HIV/chl/gon/syphilis): 09/18/18 Intimate partner violence:negative Sexual History : not sexually active  Menstrual History/LMP/Abnormal Bleeding: post-menopausal / status  post hysterectomy  Incontinence Symptoms: no problems.  Breast cancer:  - Last Mammogram: ordered 02/01/21 - BRCA gene screening: N/A  Osteoporosis: Discussed high calcium and vitamin D supplementation, weight bearing exercises  Cervical cancer screening: N/A  Skin cancer: Discussed monitoring for atypical lesions  Colorectal cancer: 04/17/12   Lung cancer:  Low Dose CT Chest recommended if Age 92-80 years, 20 pack-year currently smoking OR have quit w/in 15years. Patient does not qualify.   ECG: 08/30/17  Advanced Care Planning: A voluntary discussion about advance care planning including the explanation and discussion of advance directives.  Discussed health care proxy and Living will, and the patient was able to identify a health care proxy as daughter Renato Gails   Lipids: Lab Results   Component Value Date   CHOL 199 08/05/2020   CHOL 193 08/02/2019   CHOL 193 09/18/2018   Lab Results  Component Value Date   HDL 56 08/05/2020   HDL 60 08/02/2019   HDL 67 09/18/2018   Lab Results  Component Value Date   LDLCALC 120 (H) 08/05/2020   LDLCALC 116 (H) 08/02/2019   LDLCALC 110 (H) 09/18/2018   Lab Results  Component Value Date   TRIG 123 08/05/2020   TRIG 80 08/02/2019   TRIG 69 09/18/2018   Lab Results  Component Value Date   CHOLHDL 3.6 08/05/2020   CHOLHDL 3.2 08/02/2019   CHOLHDL 2.9 09/18/2018   No results found for: LDLDIRECT  Glucose: Glucose, Bld  Date Value Ref Range Status  08/05/2020 99 65 - 99 mg/dL Final    Comment:    .            Fasting reference interval .   08/02/2019 96 65 - 99 mg/dL Final    Comment:    .            Fasting reference interval .   10/24/2018 106 (H) 70 - 99 mg/dL Final    Patient Active Problem List   Diagnosis Date Noted   Circadian rhythm sleep disorder, shift work type 03/23/2021   Obstructive sleep apnea syndrome 03/23/2021   Attention and concentration deficit 01/20/2020   Alcohol use disorder, mild, in early remission 10/29/2019   Old tear of meniscus of right knee 03/20/2019   SOB (shortness of breath) 08/29/2017   Episodic tension-type headache, not intractable 12/20/2016   Rib pain on right side 11/27/2015   History of blood transfusion 07/30/2015   GAD (generalized anxiety disorder) 07/30/2015   Chronic constipation 07/13/2015   Insomnia 07/13/2015   MDD (major depressive disorder), recurrent episode, mild ([place]) 07/13/2015   Fibromyalgia syndrome 07/13/2015   Herpes simplex type 2 infection 07/13/2015   Morbid obesity ([place]) 07/13/2015   Hypersomnia with sleep apnea 07/13/2015   Vitamin D deficiency 07/13/2015    Past Surgical History:  Procedure Laterality Date   BUNIONECTOMY Right 11/07/2014   Dr. Hulda Humphrey at [place] Left 2012   [place]     followed by 3 cuff repairs   LIPOMA EXCISION  2012   REFRACTIVE SURGERY Bilateral    TONSILLECTOMY      Family History  Problem Relation Age of Onset   Heart attack Father    Alcohol abuse Father    COPD Sister        end stages   Bipolar disorder Brother    Depression Brother    Bipolar disorder Brother    Depression Brother    Alcohol abuse Brother  Heart attack Paternal Uncle    Breast cancer Paternal Aunt    Breast cancer Maternal Aunt     Social History   Socioeconomic History   Marital status: Divorced    Spouse name: larry   Number of children: 3   Years of education: Not on file   Highest education level: Bachelor's degree (e.g., BA, AB, BS)  Occupational History   Not on file  Tobacco Use   Smoking status: Never   Smokeless tobacco: Never  Vaping Use   Vaping Use: Never used  Substance and Sexual Activity   Alcohol use: Yes    Alcohol/week: 0.0 standard drinks    Comment: a bottle of wine a week. same with liquor   Drug use: No   Sexual activity: Not Currently  Other Topics Concern   Not on file  Social History Narrative   She has been on disability since end of 2021   She has a grown son that has Down's syndrome that lives with her       Social Determinants of Health   Financial Resource Strain: Low Risk    Difficulty of Paying Living Expenses: Not hard at all  Food Insecurity: No Food Insecurity   Worried About Charity fundraiser in the Last Year: Never true   Arboriculturist in the Last Year: Never true  Transportation Needs: No Transportation Needs   Lack of Transportation (Medical): No   Lack of Transportation (Non-Medical): No  Physical Activity: Inactive   Days of Exercise per Week: 0 days   Minutes of Exercise per Session: 0 min  Stress: Stress Concern Present   Feeling of Stress : Very much  Social Connections: Socially Isolated   Frequency of Communication with Friends and Family: Twice a week    Frequency of Social Gatherings with Friends and Family: Once a week   Attends Religious Services: Never   Marine scientist or Organizations: No   Attends Music therapist: Not on file   Marital Status: Divorced  Human resources officer Violence: Not At Risk   Fear of Current or Ex-Partner: No   Emotionally Abused: No   Physically Abused: No   Sexually Abused: No     Current Outpatient Medications:    albuterol (VENTOLIN HFA) 108 (90 Base) MCG/ACT inhaler, TAKE 2 PUFFS BY MOUTH EVERY 6 HOURS AS NEEDED FOR WHEEZE OR SHORTNESS OF BREATH, Disp: 18 each, Rfl: 1   ARIPiprazole (ABILIFY) 2 MG tablet, 2 tablets daily., Disp: , Rfl:    betamethasone acetate-betamethasone sodium phosphate (CELESTONE) 6 (3-3) MG/ML injection, Inject into the articular space., Disp: , Rfl:    DULoxetine (CYMBALTA) 60 MG capsule, Take 1 capsule (60 mg total) by mouth daily., Disp: 30 capsule, Rfl: 1   furosemide (LASIX) 20 MG tablet, TAKE 1 TABLET (20 MG TOTAL) BY MOUTH AS NEEDED FOR FLUID OR EDEMA., Disp: 90 tablet, Rfl: 0   KLOR-CON M20 20 MEQ tablet, TAKE 1 TABLET (20 MEQ TOTAL) BY MOUTH DAILY AS NEEDED (TAKE WITH LASIX)., Disp: 90 tablet, Rfl: 0   lidocaine (XYLOCAINE) 1 % (with preservative) injection, by Infiltration route., Disp: , Rfl:    Melatonin 10 MG TABS, , Disp: , Rfl:    modafinil (PROVIGIL) 200 MG tablet, Take 1 tablet (200 mg total) by mouth daily., Disp: 30 tablet, Rfl: 3   Multiple Vitamins-Minerals (ZINC PO), Take 1 tablet by mouth daily., Disp: , Rfl:    valACYclovir (VALTREX) 500 MG tablet, TAKE 1  TABLET (500 MG TOTAL) BY MOUTH DAILY. AND TWICE DAILY FOR OUTBREAKS, Disp: 115 tablet, Rfl: 1   Vitamin D, Ergocalciferol, (DRISDOL) 1.25 MG (50000 UNIT) CAPS capsule, Take 1 capsule (50,000 Units total) by mouth every 7 (seven) days., Disp: 12 capsule, Rfl: 1   LORazepam (ATIVAN) 1 MG tablet, Take 0.5-1 mg by mouth daily as needed., Disp: , Rfl:    VUITY 1.25 % SOLN, Instill 1 drop into both  eyes once a day (Patient not taking: Reported on 08/09/2021), Disp: , Rfl:   No Known Allergies   ROS  Constitutional: Negative for fever , positive for mild weight change.  Respiratory: positive for intermittent cough and   shortness of breath.   Cardiovascular: Negative for chest pain or palpitations.  Gastrointestinal: Negative for abdominal pain, no bowel changes.  Musculoskeletal: Negative for gait problem or joint swelling.  Skin: Negative for rash.  Neurological: Negative for dizziness or headache.  No other specific complaints in a complete review of systems (except as listed in HPI above).   Objective  Vitals:   08/09/21 0908  BP: 118/82  Pulse: 100  Resp: 14  Temp: 98 F (36.7 C)  TempSrc: Oral  SpO2: 97%  Weight: 206 lb (93.4 kg)  Height: 5' 5" (1.651 m)    Body mass index is 34.28 kg/m.  Physical Exam  Constitutional: Patient appears well-developed and well-nourished. No distress.  HENT: Head: Normocephalic and atraumatic. Ears: B TMs ok, no erythema or effusion; Nose: Nose normal. Mouth/Throat:not done  Eyes: Conjunctivae and EOM are normal. Pupils are equal, round, and reactive to light. No scleral icterus.  Neck: Normal range of motion. Neck supple. No JVD present. No thyromegaly present.  Cardiovascular: Normal rate, regular rhythm and normal heart sounds.  No murmur heard. No BLE edema. Pulmonary/Chest: Effort normal and breath sounds normal. No respiratory distress. Abdominal: Soft. Bowel sounds are normal, no distension. There is no tenderness. no masses Breast: no lumps or masses, no nipple discharge or rashes FEMALE GENITALIA:  Not done  RECTAL: not done  Musculoskeletal: Normal range of motion, no joint effusions. No gross deformities. Mild tenderness during palpation of lumbar spine Neurological: he is alert and oriented to person, place, and time. No cranial nerve deficit. Coordination, balance, strength, speech and gait are normal.  Skin: Skin  is warm and dry. No rash noted. No erythema.  Psychiatric: Patient has a normal mood and affect. behavior is normal. Judgment and thought content normal.   Fall Risk: Fall Risk  08/09/2021 03/24/2021 12/03/2020 10/12/2020 08/05/2020  Falls in the past year? 1 1 0 0 0  Number falls in past yr: 1 1 0 0 0  Injury with Fall? 1 1 0 0 0  Comment - L wrist - - -  Follow up - - - - -     Functional Status Survey: Is the patient deaf or have difficulty hearing?: No Does the patient have difficulty seeing, even when wearing glasses/contacts?: No Does the patient have difficulty concentrating, remembering, or making decisions?: Yes Does the patient have difficulty walking or climbing stairs?: No Does the patient have difficulty dressing or bathing?: No Does the patient have difficulty doing errands alone such as visiting a doctor's office or shopping?: No   Assessment & Plan  1. Well adult exam   2. Need for shingles vaccine  - Varicella-zoster vaccine IM  3. Need for vaccination for pneumococcus  - Pneumococcal conjugate vaccine 20-valent (Prevnar 20)  4. GAD (generalized anxiety disorder)  5. Moderate episode of recurrent major depressive disorder (HCC)  Keep follow up with Dr. Toy Care   6. Sleep apnea with hypersomnolence  - CBC with Differential/Platelet - COMPLETE METABOLIC PANEL WITH GFR  7. Vitamin D deficiency  - VITAMIN D 25 Hydroxy (Vit-D Deficiency, Fractures)  8. Obesity (BMI 30.0-34.9)  Discussed with the patient the risk posed by an increased BMI. Discussed importance of portion control, calorie counting and at least 150 minutes of physical activity weekly. Avoid sweet beverages and drink more water. Eat at least 6 servings of fruit and vegetables daily    9. Primary osteoarthritis of right knee   10. Dyslipidemia  - Lipid panel  11. Hyperglycemia  - Hemoglobin A1c  12. Long-term use of high-risk medication  - CBC with Differential/Platelet - COMPLETE  METABOLIC PANEL WITH GFR    13. Recurrent falls  - Ambulatory referral to Physical Therapy   14. Muscle spasm of back  - baclofen (LIORESAL) 10 MG tablet; Take 1 tablet (10 mg total) by mouth 3 (three) times daily as needed for muscle spasms.  Dispense: 30 each; Refill: 0   Last fall about 10 days ago, she had a very high bed and fell backwards while climbing on bed at night. She states back is a little sore , had two other falls earlier this year  -USPSTF grade A and B recommendations reviewed with patient; age-appropriate recommendations, preventive care, screening tests, etc discussed and encouraged; healthy living encouraged; see AVS for patient education given to patient -Discussed importance of 150 minutes of physical activity weekly, eat two servings of fish weekly, eat one serving of tree nuts ( cashews, pistachios, pecans, almonds.Marland Kitchen) every other day, eat 6 servings of fruit/vegetables daily and drink plenty of water and avoid sweet beverages.

## 2021-08-05 NOTE — Patient Instructions (Signed)
Preventive Care 61-61 Years Old, Female Preventive care refers to lifestyle choices and visits with your health care provider that can promote health and wellness. This includes: A yearly physical exam. This is also called an annual wellness visit. Regular dental and eye exams. Immunizations. Screening for certain conditions. Healthy lifestyle choices, such as: Eating a healthy diet. Getting regular exercise. Not using drugs or products that contain nicotine and tobacco. Limiting alcohol use. What can I expect for my preventive care visit? Physical exam Your health care provider will check your: Height and weight. These may be used to calculate your BMI (body mass index). BMI is a measurement that tells if you are at a healthy weight. Heart rate and blood pressure. Body temperature. Skin for abnormal spots. Counseling Your health care provider may ask you questions about your: Past medical problems. Family's medical history. Alcohol, tobacco, and drug use. Emotional well-being. Home life and relationship well-being. Sexual activity. Diet, exercise, and sleep habits. Work and work Statistician. Access to firearms. Method of birth control. Menstrual cycle. Pregnancy history. What immunizations do I need?  Vaccines are usually given at various ages, according to a schedule. Your health care provider will recommend vaccines for you based on your age, medicalhistory, and lifestyle or other factors, such as travel or where you work. What tests do I need? Blood tests Lipid and cholesterol levels. These may be checked every 5 years, or more often if you are over 61 years old. Hepatitis C test. Hepatitis B test. Screening Lung cancer screening. You may have this screening every year starting at age 61 if you have a 30-pack-year history of smoking and currently smoke or have quit within the past 15 years. Colorectal cancer screening. All adults should have this screening starting at  age 61 and continuing until age 61. Your health care provider may recommend screening at age 61 if you are at increased risk. You will have tests every 1-10 years, depending on your results and the type of screening test. Diabetes screening. This is done by checking your blood sugar (glucose) after you have not eaten for a while (fasting). You may have this done every 1-3 years. Mammogram. This may be done every 1-2 years. Talk with your health care provider about when you should start having regular mammograms. This may depend on whether you have a family history of breast cancer. BRCA-related cancer screening. This may be done if you have a family history of breast, ovarian, tubal, or peritoneal cancers. Pelvic exam and Pap test. This may be done every 3 years starting at age 61. Starting at age 61, this may be done every 5 years if you have a Pap test in combination with an HPV test. Other tests STD (sexually transmitted disease) testing, if you are at risk. Bone density scan. This is done to screen for osteoporosis. You may have this scan if you are at high risk for osteoporosis. Talk with your health care provider about your test results, treatment options,and if necessary, the need for more tests. Follow these instructions at home: Eating and drinking  Eat a diet that includes fresh fruits and vegetables, whole grains, lean protein, and low-fat dairy products. Take vitamin and mineral supplements as recommended by your health care provider. Do not drink alcohol if: Your health care provider tells you not to drink. You are pregnant, may be pregnant, or are planning to become pregnant. If you drink alcohol: Limit how much you have to 0-1 drink a day. Be aware  of how much alcohol is in your drink. In the [place] one drink equals one 12 oz bottle of beer (355 mL), one 5 oz glass of wine (148 mL), or one 1 oz glass of hard liquor (44 mL).  Lifestyle Take daily care of your teeth and  gums. Brush your teeth every morning and night with fluoride toothpaste. Floss one time each day. Stay active. Exercise for at least 30 minutes 5 or more days each week. Do not use any products that contain nicotine or tobacco, such as cigarettes, e-cigarettes, and chewing tobacco. If you need help quitting, ask your health care provider. Do not use drugs. If you are sexually active, practice safe sex. Use a condom or other form of protection to prevent STIs (sexually transmitted infections). If you do not wish to become pregnant, use a form of birth control. If you plan to become pregnant, see your health care provider for a prepregnancy visit. If told by your health care provider, take low-dose aspirin daily starting at age 61. Find healthy ways to cope with stress, such as: Meditation, yoga, or listening to music. Journaling. Talking to a trusted person. Spending time with friends and family. Safety Always wear your seat belt while driving or riding in a vehicle. Do not drive: If you have been drinking alcohol. Do not ride with someone who has been drinking. When you are tired or distracted. While texting. Wear a helmet and other protective equipment during sports activities. If you have firearms in your house, make sure you follow all gun safety procedures. What's next? Visit your health care provider once a year for an annual wellness visit. Ask your health care provider how often you should have your eyes and teeth checked. Stay up to date on all vaccines. This information is not intended to replace advice given to you by your health care provider. Make sure you discuss any questions you have with your healthcare provider. Document Revised: 09/08/2020 Document Reviewed: 08/16/2018 Elsevier Patient Education  2022 [employer].

## 2021-08-09 ENCOUNTER — Other Ambulatory Visit: Payer: Self-pay

## 2021-08-09 ENCOUNTER — Ambulatory Visit (INDEPENDENT_AMBULATORY_CARE_PROVIDER_SITE_OTHER): Payer: BC Managed Care – PPO | Admitting: Family Medicine

## 2021-08-09 ENCOUNTER — Telehealth: Payer: Self-pay | Admitting: Emergency Medicine

## 2021-08-09 ENCOUNTER — Encounter: Payer: Self-pay | Admitting: Family Medicine

## 2021-08-09 VITALS — BP 118/82 | HR 100 | Temp 98.0°F | Resp 14 | Ht 65.0 in | Wt 206.0 lb

## 2021-08-09 DIAGNOSIS — G4710 Hypersomnia, unspecified: Secondary | ICD-10-CM | POA: Diagnosis not present

## 2021-08-09 DIAGNOSIS — E785 Hyperlipidemia, unspecified: Secondary | ICD-10-CM

## 2021-08-09 DIAGNOSIS — Z0000 Encounter for general adult medical examination without abnormal findings: Secondary | ICD-10-CM | POA: Diagnosis not present

## 2021-08-09 DIAGNOSIS — R739 Hyperglycemia, unspecified: Secondary | ICD-10-CM

## 2021-08-09 DIAGNOSIS — G4730 Sleep apnea, unspecified: Secondary | ICD-10-CM

## 2021-08-09 DIAGNOSIS — R296 Repeated falls: Secondary | ICD-10-CM

## 2021-08-09 DIAGNOSIS — E669 Obesity, unspecified: Secondary | ICD-10-CM

## 2021-08-09 DIAGNOSIS — M1711 Unilateral primary osteoarthritis, right knee: Secondary | ICD-10-CM

## 2021-08-09 DIAGNOSIS — E559 Vitamin D deficiency, unspecified: Secondary | ICD-10-CM | POA: Diagnosis not present

## 2021-08-09 DIAGNOSIS — F331 Major depressive disorder, recurrent, moderate: Secondary | ICD-10-CM | POA: Diagnosis not present

## 2021-08-09 DIAGNOSIS — F411 Generalized anxiety disorder: Secondary | ICD-10-CM | POA: Diagnosis not present

## 2021-08-09 DIAGNOSIS — Z23 Encounter for immunization: Secondary | ICD-10-CM

## 2021-08-09 DIAGNOSIS — M62830 Muscle spasm of back: Secondary | ICD-10-CM

## 2021-08-09 DIAGNOSIS — Z79899 Other long term (current) drug therapy: Secondary | ICD-10-CM

## 2021-08-09 MED ORDER — BACLOFEN 10 MG PO TABS
10.0000 mg | ORAL_TABLET | Freq: Three times a day (TID) | ORAL | 0 refills | Status: DC | PRN
Start: 2021-08-09 — End: 2021-10-31

## 2021-08-09 NOTE — Telephone Encounter (Signed)
Would like a muscle relaxer called in for pain in back. Please call to [facility].

## 2021-08-10 LAB — LIPID PANEL
Cholesterol: 223 mg/dL — ABNORMAL HIGH (ref <200)
HDL: 66 mg/dL (ref >=50)
LDL Cholesterol (Calc): 135 mg/dL (calc) — ABNORMAL HIGH
Non-HDL Cholesterol (Calc): 157 mg/dL (calc) — ABNORMAL HIGH (ref <130)
Total CHOL/HDL Ratio: 3.4 (calc) (ref <5.0)
Triglycerides: 116 mg/dL (ref <150)

## 2021-08-10 LAB — CBC WITH DIFFERENTIAL/PLATELET
Absolute Monocytes: 447 cells/uL (ref 200–950)
Basophils Absolute: 28 cells/uL (ref 0–200)
Basophils Relative: 0.4 %
Eosinophils Absolute: 99 cells/uL (ref 15–500)
Eosinophils Relative: 1.4 %
HCT: 45.1 % — ABNORMAL HIGH (ref 35.0–45.0)
Hemoglobin: 15.2 g/dL (ref 11.7–15.5)
Lymphs Abs: 2151 cells/uL (ref 850–3900)
MCH: 29.3 pg (ref 27.0–33.0)
MCHC: 33.7 g/dL (ref 32.0–36.0)
MCV: 87.1 fL (ref 80.0–100.0)
MPV: 12.3 fL (ref 7.5–12.5)
Monocytes Relative: 6.3 %
Neutro Abs: 4374 cells/uL (ref 1500–7800)
Neutrophils Relative %: 61.6 %
Platelets: 231 10*3/uL (ref 140–400)
RBC: 5.18 10*6/uL — ABNORMAL HIGH (ref 3.80–5.10)
RDW: 14.3 % (ref 11.0–15.0)
Total Lymphocyte: 30.3 %
WBC: 7.1 10*3/uL (ref 3.8–10.8)

## 2021-08-10 LAB — COMPLETE METABOLIC PANEL WITH GFR
AG Ratio: 1.7 (calc) (ref 1.0–2.5)
ALT: 17 U/L (ref 6–29)
AST: 19 U/L (ref 10–35)
Albumin: 4.6 g/dL (ref 3.6–5.1)
Alkaline phosphatase (APISO): 84 U/L (ref 37–153)
BUN: 15 mg/dL (ref 7–25)
CO2: 32 mmol/L (ref 20–32)
Calcium: 9.8 mg/dL (ref 8.6–10.4)
Chloride: 100 mmol/L (ref 98–110)
Creat: 0.92 mg/dL (ref 0.50–1.05)
Globulin: 2.7 g/dL (calc) (ref 1.9–3.7)
Glucose, Bld: 112 mg/dL — ABNORMAL HIGH (ref 65–99)
Potassium: 5 mmol/L (ref 3.5–5.3)
Sodium: 140 mmol/L (ref 135–146)
Total Bilirubin: 0.7 mg/dL (ref 0.2–1.2)
Total Protein: 7.3 g/dL (ref 6.1–8.1)
eGFR: 71 mL/min/{1.73_m2} (ref >=60)

## 2021-08-10 LAB — HEMOGLOBIN A1C
Hgb A1c MFr Bld: 5.3 % of total Hgb (ref <5.7)
Mean Plasma Glucose: 105 mg/dL
eAG (mmol/L): 5.8 mmol/L

## 2021-08-10 LAB — VITAMIN D 25 HYDROXY (VIT D DEFICIENCY, FRACTURES): Vit D, 25-Hydroxy: 63 ng/mL (ref 30–100)

## 2021-08-27 ENCOUNTER — Ambulatory Visit (HOSPITAL_COMMUNITY)
Admission: RE | Admit: 2021-08-27 | Discharge: 2021-08-27 | Disposition: A | Discharge status: Home / Self Care | Payer: BC Managed Care – PPO | Source: Ambulatory Visit | Attending: Internal Medicine | Admitting: Internal Medicine

## 2021-08-27 ENCOUNTER — Other Ambulatory Visit (HOSPITAL_COMMUNITY): Payer: Self-pay | Admitting: *Deleted

## 2021-08-27 ENCOUNTER — Other Ambulatory Visit: Payer: Self-pay

## 2021-08-27 VITALS — BP 116/82 | HR 79 | Ht 65.0 in | Wt 213.0 lb

## 2021-08-27 DIAGNOSIS — R600 Localized edema: Secondary | ICD-10-CM | POA: Diagnosis not present

## 2021-08-27 DIAGNOSIS — F419 Anxiety disorder, unspecified: Secondary | ICD-10-CM | POA: Diagnosis not present

## 2021-08-27 DIAGNOSIS — Z6835 Body mass index (BMI) 35.0-35.9, adult: Secondary | ICD-10-CM | POA: Insufficient documentation

## 2021-08-27 DIAGNOSIS — R0789 Other chest pain: Secondary | ICD-10-CM

## 2021-08-27 DIAGNOSIS — Z8616 Personal history of COVID-19: Secondary | ICD-10-CM | POA: Diagnosis not present

## 2021-08-27 DIAGNOSIS — R0609 Other forms of dyspnea: Secondary | ICD-10-CM | POA: Diagnosis present

## 2021-08-27 DIAGNOSIS — R0600 Dyspnea, unspecified: Secondary | ICD-10-CM | POA: Diagnosis not present

## 2021-08-27 DIAGNOSIS — Z8249 Family history of ischemic heart disease and other diseases of the circulatory system: Secondary | ICD-10-CM | POA: Diagnosis not present

## 2021-08-27 DIAGNOSIS — G4733 Obstructive sleep apnea (adult) (pediatric): Secondary | ICD-10-CM | POA: Insufficient documentation

## 2021-08-27 DIAGNOSIS — I5032 Chronic diastolic (congestive) heart failure: Secondary | ICD-10-CM | POA: Diagnosis not present

## 2021-08-27 DIAGNOSIS — Z79899 Other long term (current) drug therapy: Secondary | ICD-10-CM | POA: Insufficient documentation

## 2021-08-27 LAB — BASIC METABOLIC PANEL
Anion gap: 8 (ref 5–15)
BUN: 16 mg/dL (ref 6–20)
CO2: 27 mmol/L (ref 22–32)
Calcium: 9.5 mg/dL (ref 8.9–10.3)
Chloride: 102 mmol/L (ref 98–111)
Creatinine, Ser: 0.94 mg/dL (ref 0.44–1.00)
GFR, Estimated: >60 mL/min (ref >60)
Glucose, Bld: 83 mg/dL (ref 70–99)
Potassium: 4.5 mmol/L (ref 3.5–5.1)
Sodium: 137 mmol/L (ref 135–145)

## 2021-08-27 LAB — CBC
HCT: 40.4 % (ref 36.0–46.0)
Hemoglobin: 13.8 g/dL (ref 12.0–15.0)
MCH: 29.4 pg (ref 26.0–34.0)
MCHC: 34.2 g/dL (ref 30.0–36.0)
MCV: 86 fL (ref 80.0–100.0)
Platelets: 226 10*3/uL (ref 150–400)
RBC: 4.7 MIL/uL (ref 3.87–5.11)
RDW: 13.3 % (ref 11.5–15.5)
WBC: 7.2 10*3/uL (ref 4.0–10.5)
nRBC: 0 % (ref 0.0–0.2)

## 2021-08-27 NOTE — H&P (View-Only) (Signed)
ADVANCED HF CLINIC CONSULT NOTE  Referring Physician:Dr. Patsey Berthold Primary Care: Steele Sizer, MD Primary Cardiologist: Dr. Rockey Situ   HPI:  Teresa Gallagher is a 60 year old lifelong never smoker with OSA, morbid obesity, anxiety who is referred by Dr. Patsey Berthold for further evaluation of progressive DOE after COVID-19 infection in January 2022 and consideration of exercise RHC.   Seen by Dr. Rockey Situ in 9/18 for evaluation of progressive DOE. Echo 55-60%. Started on lasix for possible diastolic HF. Had CPX with excellent functional capacity. No cardiopulmonary limitation. Felt to be limited by obesity.  Seen by pulmonary, started on Symbicort, weight loss recommended  Says breathing has gotten worse since COVID. ECHO 2/22 EF 60-65% G1DD. RV normal. RVSP 58mHG.  PFTs were performed on 17 February 2021 these show very mild restriction likely secondary to obesity with no bronchodilator response.  Diffusion capacity normal.   Says she can get do her ADLs but has to take frequent breaks due to dyspnea. SMichela Pitcherthis is some better since COVID but much worse since 2018. Occasionally gets tightness in her chest when she ambulates - about once a week. + orthopnea and PND. + lower extremity edema R>L. Takes lasix 20 mg or 40 mg daily. BP has been well controlled.    CPX 8/18  FVC 2.46 (82%)      FEV1 1.95 (81%)        FEV1/FVC 79 (99%)        MVV 95 (98%)     Resting HR: 94 Peak HR: 164   (100% age predicted max HR)  BP rest: 106/82 BP peak: 174/96  Peak VO2: 22.6 (116% predicted peak VO2) - When adjusted to the patient's ideal body weight of 145.9 lb (66.2 kg) the peak VO2 is 31.0 ml/kg (ibw)/min  VE/VCO2 slope:  28  OUES: 2.31  Peak RER: 1.03  VE/MVV:  68%  O2pulse:  13   (118% predicted O2pulse)    Review of Systems: [y] = yes, '[ ]'$  = no   General: Weight gain '[ ]'$ ; Weight loss '[ ]'$ ; Anorexia '[ ]'$ ; Fatigue [Blue.Reese]; Fever '[ ]'$ ; Chills '[ ]'$ ; Weakness '[ ]'$   Cardiac: Chest pain/pressure [ y]; Resting SOB [  y]; Exertional SOB [ y]; Orthopnea [ y]; Pedal Edema [ y]; Palpitations '[ ]'$ ; Syncope '[ ]'$ ; Presyncope '[ ]'$ ; Paroxysmal nocturnal dyspnea'[ ]'$   Pulmonary: Cough '[ ]'$ ; Wheezing'[ ]'$ ; Hemoptysis'[ ]'$ ; Sputum '[ ]'$ ; Snoring '[ ]'$   GI: Vomiting'[ ]'$ ; Dysphagia'[ ]'$ ; Melena'[ ]'$ ; Hematochezia '[ ]'$ ; Heartburn'[ ]'$ ; Abdominal pain '[ ]'$ ; Constipation '[ ]'$ ; Diarrhea '[ ]'$ ; BRBPR '[ ]'$   GU: Hematuria'[ ]'$ ; Dysuria '[ ]'$ ; Nocturia'[ ]'$   Vascular: Pain in legs with walking '[ ]'$ ; Pain in feet with lying flat '[ ]'$ ; Non-healing sores '[ ]'$ ; Stroke '[ ]'$ ; TIA '[ ]'$ ; Slurred speech '[ ]'$ ;  Neuro: Headaches'[ ]'$ ; Vertigo'[ ]'$ ; Seizures'[ ]'$ ; Paresthesias'[ ]'$ ;Blurred vision '[ ]'$ ; Diplopia '[ ]'$ ; Vision changes '[ ]'$   Ortho/Skin: Arthritis [ y]; Joint pain [ y]; Muscle pain '[ ]'$ ; Joint swelling '[ ]'$ ; Back Pain '[ ]'$ ; Rash '[ ]'$   Psych: Depression'[ ]'$ ; Anxiety[ y]  Heme: Bleeding problems '[ ]'$ ; Clotting disorders '[ ]'$ ; Anemia '[ ]'$   Endocrine: Diabetes '[ ]'$ ; Thyroid dysfunction'[ ]'$    Past Medical History:  Diagnosis Date   Acne    Arrhythmia    Baker's cyst, right 10/2018   calf   Blood transfusion without reported diagnosis    Bunion    Cervicalgia    Chronic kidney  disease    Kidney Stents   Depression    Herpes simplex without mention of complication    Mastodynia    Myalgia and myositis, unspecified    Other acne    Other malaise and fatigue    Painful respiration    Palpitations    Seborrhea capitis    Sleep apnea in adult    Symptomatic menopausal or female climacteric states    Unspecified constipation    Unspecified sleep apnea    Unspecified vitamin D deficiency    Vitamin D deficiency     Current Outpatient Medications  Medication Sig Dispense Refill   albuterol (VENTOLIN HFA) 108 (90 Base) MCG/ACT inhaler TAKE 2 PUFFS BY MOUTH EVERY 6 HOURS AS NEEDED FOR WHEEZE OR SHORTNESS OF BREATH 18 each 1   ARIPiprazole (ABILIFY) 2 MG tablet Take 2 tablets by mouth daily.     baclofen (LIORESAL) 10 MG tablet Take 1 tablet (10 mg total) by mouth 3 (three) times  daily as needed for muscle spasms. 30 each 0   betamethasone acetate-betamethasone sodium phosphate (CELESTONE) 6 (3-3) MG/ML injection Inject into the articular space.     DULoxetine (CYMBALTA) 60 MG capsule Take 1 capsule (60 mg total) by mouth daily. 30 capsule 1   furosemide (LASIX) 20 MG tablet TAKE 1 TABLET (20 MG TOTAL) BY MOUTH AS NEEDED FOR FLUID OR EDEMA. 90 tablet 0   KLOR-CON M20 20 MEQ tablet TAKE 1 TABLET (20 MEQ TOTAL) BY MOUTH DAILY AS NEEDED (TAKE WITH LASIX). 90 tablet 0   lidocaine (XYLOCAINE) 1 % (with preservative) injection by Infiltration route.     LORazepam (ATIVAN) 1 MG tablet Take 0.5-1 mg by mouth daily as needed.     Melatonin 10 MG TABS      modafinil (PROVIGIL) 200 MG tablet Take 1 tablet (200 mg total) by mouth daily. 30 tablet 3   Multiple Vitamins-Minerals (ZINC PO) Take 1 tablet by mouth daily.     valACYclovir (VALTREX) 500 MG tablet TAKE 1 TABLET (500 MG TOTAL) BY MOUTH DAILY. AND TWICE DAILY FOR OUTBREAKS 115 tablet 1   Vitamin D, Ergocalciferol, (DRISDOL) 1.25 MG (50000 UNIT) CAPS capsule Take 1 capsule (50,000 Units total) by mouth every 7 (seven) days. 12 capsule 1   VUITY 1.25 % SOLN Instill 1 drop into both eyes once a day (Patient not taking: Reported on 08/09/2021)     No current facility-administered medications for this encounter.    No Known Allergies    Social History   Socioeconomic History   Marital status: Divorced    Spouse name: Teresa Gallagher   Number of children: 3   Years of education: Not on file   Highest education level: Bachelor's degree (e.g., BA, AB, BS)  Occupational History   Not on file  Tobacco Use   Smoking status: Never   Smokeless tobacco: Never  Vaping Use   Vaping Use: Never used  Substance and Sexual Activity   Alcohol use: Yes    Alcohol/week: 0.0 standard drinks    Comment: a bottle of wine a week. same with liquor   Drug use: No   Sexual activity: Not Currently  Other Topics Concern   Not on file  Social  History Narrative   She has been on disability since end of 2021   She has a grown son that has Down's syndrome that lives with her       Social Determinants of Health   Financial Resource Strain: Low Risk  Difficulty of Paying Living Expenses: Not hard at all  Food Insecurity: No Food Insecurity   Worried About [place] in the Last Year: Never true   Ran Out of Food in the Last Year: Never true  Transportation Needs: No Transportation Needs   Lack of Transportation (Medical): No   Lack of Transportation (Non-Medical): No  Physical Activity: Inactive   Days of Exercise per Week: 0 days   Minutes of Exercise per Session: 0 min  Stress: Stress Concern Present   Feeling of Stress : Very much  Social Connections: Socially Isolated   Frequency of Communication with Friends and Family: Twice a week   Frequency of Social Gatherings with Friends and Family: Once a week   Attends Religious Services: Never   Marine scientist or Organizations: No   Attends Music therapist: Not on file   Marital Status: Divorced  Human resources officer Violence: Not At Risk   Fear of Current or Ex-Partner: No   Emotionally Abused: No   Physically Abused: No   Sexually Abused: No      Family History  Problem Relation Age of Onset   Heart attack Father    Alcohol abuse Father    COPD Sister        end stages   Bipolar disorder Brother    Depression Brother    Bipolar disorder Brother    Depression Brother    Alcohol abuse Brother    Heart attack Paternal Uncle    Breast cancer Paternal Aunt    Breast cancer Maternal Aunt     Vitals:   08/27/21 1425  BP: 116/82  Pulse: 79  SpO2: 97%  Weight: 96.6 kg (213 lb)  Height: '5\' 5"'$  (1.651 m)    PHYSICAL EXAM: General:  Well appearing. No respiratory difficulty HEENT: normal Neck: supple. JVP 7. Carotids 2+ bilat; no bruits. No lymphadenopathy or thryomegaly appreciated. Cor: PMI nondisplaced. Regular rate & rhythm.  No rubs, gallops or murmurs. Lungs: clear Abdomen: obese soft, nontender, nondistended. No hepatosplenomegaly. No bruits or masses. Good bowel sounds. Extremities: no cyanosis, clubbing, rash, tr-1+ edema Neuro: alert & oriented x 3, cranial nerves grossly intact. moves all 4 extremities w/o difficulty. Affect pleasant.  ECG: pending   ASSESSMENT & PLAN:  1.  Dyspnea on exertion - this has been longstanding complaint for her that preceded her COVID infection. She has had fairly thorough non-invasive w/u to date including CPX testing which has shown only obesity as a major contributing factor.  - That said her symptoms have progressed consistently over the past few years and she now is having exertional chest tightness and at least mild PH on her echo.  - I discussed options with her and Dr. Patsey Berthold and will plan for R/L cath as well as enrollment into exercise program (PREP) - If left-sided pressures on cath, consider SGLT2i  2. OSA on CPAP - continue CPAP  3. Morbid obesity - stressed need for weight loss - Body mass index is 35.45 kg/m.  Glori Bickers, MD  2:39 PM

## 2021-08-27 NOTE — Addendum Note (Signed)
Encounter addended by: Maple Mirza, RN on: 08/27/2021 3:20 PM  Actions taken: Visit diagnoses modified, Order list changed, Diagnosis association updated, Charge Capture section accepted, Pend clinical note, Clinical Note Signed

## 2021-08-27 NOTE — Patient Instructions (Signed)
No medication changes today!  Your physician recommends that you schedule a follow-up appointment in: 6 months with Dr Haroldine Laws. Our office will call you to schedule this appointment.   Please call office at [phone] option 2 if you have any questions or concerns.   At the [facility], you and your health needs are our priority. As part of our continuing mission to provide you with exceptional heart care, we have created designated Provider Care Teams. These Care Teams include your primary Cardiologist (physician) and Advanced Practice Providers (APPs- Physician Assistants and Nurse Practitioners) who all work together to provide you with the care you need, when you need it.   You may see any of the following providers on your designated Care Team at your next follow up: Dr Glori Bickers Dr Loralie Champagne Dr Patrice Paradise, NP Lyda Jester, [place] Ginnie Smart Audry Riles, PharmD   Please be sure to bring in all your medications bottles to every appointment.    [facility] [id number] [place] [address] Dept: [phone] Loc: [place]  08/27/2021  You are scheduled for a Cardiac Catheterization on Thursday, September 15 with Dr. Glori Bickers.  1. Please arrive at the [facility] (Main Entrance A) at [facility]: [address] [place], [place] [address] at 6:30 AM (This time is two hours before your procedure to ensure your preparation). Free valet parking service is available.   Special note: Every effort is made to have your procedure done on time. Please understand that emergencies sometimes delay scheduled procedures.  2. Diet: Do not eat or drink anything after midnight  3. Labs: Done today in office  4. Medication instructions in preparation for your procedure:   Contrast Allergy: No   Hold all  medications on the morning of your procedure   5. Plan for one night stay--bring personal belongings. 6. Bring a current list of your medications and current insurance cards. 7. You MUST have a responsible person to drive you home. 8. Someone MUST be with you the first 24 hours after you arrive home or your discharge will be delayed. 9. Please wear clothes that are easy to get on and off and wear slip-on shoes.  Thank you for allowing [place] to care for you!   -- [facility]

## 2021-08-27 NOTE — Progress Notes (Signed)
ADVANCED HF CLINIC CONSULT NOTE  Referring Physician:Dr. Patsey Berthold Primary Care: Steele Sizer, MD Primary Cardiologist: Dr. Rockey Situ   HPI:  [name] is a 60 year old lifelong never smoker with OSA, morbid obesity, anxiety who is referred by Dr. Patsey Berthold for further evaluation of progressive DOE after COVID-19 infection in January 2022 and consideration of exercise RHC.   Seen by Dr. Rockey Situ in 9/18 for evaluation of progressive DOE. Echo 55-60%. Started on lasix for possible diastolic HF. Had CPX with excellent functional capacity. No cardiopulmonary limitation. Felt to be limited by obesity.  Seen by pulmonary, started on Symbicort, weight loss recommended  Says breathing has gotten worse since COVID. ECHO 2/22 EF 60-65% G1DD. RV normal. RVSP 50mHG.  PFTs were performed on 17 February 2021 these show very mild restriction likely secondary to obesity with no bronchodilator response.  Diffusion capacity normal.   Says she can get do her ADLs but has to take frequent breaks due to dyspnea. SMichela Pitcherthis is some better since COVID but much worse since 2018. Occasionally gets tightness in her chest when she ambulates - about once a week. + orthopnea and PND. + lower extremity edema R>L. Takes lasix 20 mg or 40 mg daily. BP has been well controlled.    CPX 8/18  FVC 2.46 (82%)      FEV1 1.95 (81%)        FEV1/FVC 79 (99%)        MVV 95 (98%)     Resting HR: 94 Peak HR: 164   (100% age predicted max HR)  BP rest: 106/82 BP peak: 174/96  Peak VO2: 22.6 (116% predicted peak VO2) - When adjusted to the patient's ideal body weight of 145.9 lb (66.2 kg) the peak VO2 is 31.0 ml/kg (ibw)/min  VE/VCO2 slope:  28  OUES: 2.31  Peak RER: 1.03  VE/MVV:  68%  O2pulse:  13   (118% predicted O2pulse)    Review of Systems: [y] = yes, '[ ]'$  = no   General: Weight gain '[ ]'$ ; Weight loss '[ ]'$ ; Anorexia '[ ]'$ ; Fatigue [Blue.Reese]; Fever '[ ]'$ ; Chills '[ ]'$ ; Weakness '[ ]'$   Cardiac: Chest pain/pressure [ y]; Resting SOB [  y]; Exertional SOB [ y]; Orthopnea [ y]; Pedal Edema [ y]; Palpitations '[ ]'$ ; Syncope '[ ]'$ ; Presyncope '[ ]'$ ; Paroxysmal nocturnal dyspnea'[ ]'$   Pulmonary: Cough '[ ]'$ ; Wheezing'[ ]'$ ; Hemoptysis'[ ]'$ ; Sputum '[ ]'$ ; Snoring '[ ]'$   GI: Vomiting'[ ]'$ ; Dysphagia'[ ]'$ ; Melena'[ ]'$ ; Hematochezia '[ ]'$ ; Heartburn'[ ]'$ ; Abdominal pain '[ ]'$ ; Constipation '[ ]'$ ; Diarrhea '[ ]'$ ; BRBPR '[ ]'$   GU: Hematuria'[ ]'$ ; Dysuria '[ ]'$ ; Nocturia'[ ]'$   Vascular: Pain in legs with walking '[ ]'$ ; Pain in feet with lying flat '[ ]'$ ; Non-healing sores '[ ]'$ ; Stroke '[ ]'$ ; TIA '[ ]'$ ; Slurred speech '[ ]'$ ;  Neuro: Headaches'[ ]'$ ; Vertigo'[ ]'$ ; Seizures'[ ]'$ ; Paresthesias'[ ]'$ ;Blurred vision '[ ]'$ ; Diplopia '[ ]'$ ; Vision changes '[ ]'$   Ortho/Skin: Arthritis [ y]; Joint pain [ y]; Muscle pain '[ ]'$ ; Joint swelling '[ ]'$ ; Back Pain '[ ]'$ ; Rash '[ ]'$   Psych: Depression'[ ]'$ ; Anxiety[ y]  Heme: Bleeding problems '[ ]'$ ; Clotting disorders '[ ]'$ ; Anemia '[ ]'$   Endocrine: Diabetes '[ ]'$ ; Thyroid dysfunction'[ ]'$    Past Medical History:  Diagnosis Date   Acne    Arrhythmia    Baker's cyst, right 10/2018   calf   Blood transfusion without reported diagnosis    Bunion    Cervicalgia    Chronic kidney  disease    Kidney Stents   Depression    Herpes simplex without mention of complication    Mastodynia    Myalgia and myositis, unspecified    Other acne    Other malaise and fatigue    Painful respiration    Palpitations    Seborrhea capitis    Sleep apnea in adult    Symptomatic menopausal or female climacteric states    Unspecified constipation    Unspecified sleep apnea    Unspecified vitamin D deficiency    Vitamin D deficiency     Current Outpatient Medications  Medication Sig Dispense Refill   albuterol (VENTOLIN HFA) 108 (90 Base) MCG/ACT inhaler TAKE 2 PUFFS BY MOUTH EVERY 6 HOURS AS NEEDED FOR WHEEZE OR SHORTNESS OF BREATH 18 each 1   ARIPiprazole (ABILIFY) 2 MG tablet Take 2 tablets by mouth daily.     baclofen (LIORESAL) 10 MG tablet Take 1 tablet (10 mg total) by mouth 3 (three) times  daily as needed for muscle spasms. 30 each 0   betamethasone acetate-betamethasone sodium phosphate (CELESTONE) 6 (3-3) MG/ML injection Inject into the articular space.     DULoxetine (CYMBALTA) 60 MG capsule Take 1 capsule (60 mg total) by mouth daily. 30 capsule 1   furosemide (LASIX) 20 MG tablet TAKE 1 TABLET (20 MG TOTAL) BY MOUTH AS NEEDED FOR FLUID OR EDEMA. 90 tablet 0   KLOR-CON M20 20 MEQ tablet TAKE 1 TABLET (20 MEQ TOTAL) BY MOUTH DAILY AS NEEDED (TAKE WITH LASIX). 90 tablet 0   lidocaine (XYLOCAINE) 1 % (with preservative) injection by Infiltration route.     LORazepam (ATIVAN) 1 MG tablet Take 0.5-1 mg by mouth daily as needed.     Melatonin 10 MG TABS      modafinil (PROVIGIL) 200 MG tablet Take 1 tablet (200 mg total) by mouth daily. 30 tablet 3   Multiple Vitamins-Minerals (ZINC PO) Take 1 tablet by mouth daily.     valACYclovir (VALTREX) 500 MG tablet TAKE 1 TABLET (500 MG TOTAL) BY MOUTH DAILY. AND TWICE DAILY FOR OUTBREAKS 115 tablet 1   Vitamin D, Ergocalciferol, (DRISDOL) 1.25 MG (50000 UNIT) CAPS capsule Take 1 capsule (50,000 Units total) by mouth every 7 (seven) days. 12 capsule 1   VUITY 1.25 % SOLN Instill 1 drop into both eyes once a day (Patient not taking: Reported on 08/09/2021)     No current facility-administered medications for this encounter.    No Known Allergies    Social History   Socioeconomic History   Marital status: Divorced    Spouse name: larry   Number of children: 3   Years of education: Not on file   Highest education level: Bachelor's degree (e.g., BA, AB, BS)  Occupational History   Not on file  Tobacco Use   Smoking status: Never   Smokeless tobacco: Never  Vaping Use   Vaping Use: Never used  Substance and Sexual Activity   Alcohol use: Yes    Alcohol/week: 0.0 standard drinks    Comment: a bottle of wine a week. same with liquor   Drug use: No   Sexual activity: Not Currently  Other Topics Concern   Not on file  Social  History Narrative   She has been on disability since end of 2021   She has a grown son that has Down's syndrome that lives with her       Social Determinants of Health   Financial Resource Strain: Low Risk  Difficulty of Paying Living Expenses: Not hard at all  Food Insecurity: No Food Insecurity   Worried About [place] in the Last Year: Never true   Ran Out of Food in the Last Year: Never true  Transportation Needs: No Transportation Needs   Lack of Transportation (Medical): No   Lack of Transportation (Non-Medical): No  Physical Activity: Inactive   Days of Exercise per Week: 0 days   Minutes of Exercise per Session: 0 min  Stress: Stress Concern Present   Feeling of Stress : Very much  Social Connections: Socially Isolated   Frequency of Communication with Friends and Family: Twice a week   Frequency of Social Gatherings with Friends and Family: Once a week   Attends Religious Services: Never   Marine scientist or Organizations: No   Attends Music therapist: Not on file   Marital Status: Divorced  Human resources officer Violence: Not At Risk   Fear of Current or Ex-Partner: No   Emotionally Abused: No   Physically Abused: No   Sexually Abused: No      Family History  Problem Relation Age of Onset   Heart attack Father    Alcohol abuse Father    COPD Sister        end stages   Bipolar disorder Brother    Depression Brother    Bipolar disorder Brother    Depression Brother    Alcohol abuse Brother    Heart attack Paternal Uncle    Breast cancer Paternal Aunt    Breast cancer Maternal Aunt     Vitals:   08/27/21 1425  BP: 116/82  Pulse: 79  SpO2: 97%  Weight: 96.6 kg (213 lb)  Height: '5\' 5"'$  (1.651 m)    PHYSICAL EXAM: General:  Well appearing. No respiratory difficulty HEENT: normal Neck: supple. JVP 7. Carotids 2+ bilat; no bruits. No lymphadenopathy or thryomegaly appreciated. Cor: PMI nondisplaced. Regular rate & rhythm.  No rubs, gallops or murmurs. Lungs: clear Abdomen: obese soft, nontender, nondistended. No hepatosplenomegaly. No bruits or masses. Good bowel sounds. Extremities: no cyanosis, clubbing, rash, tr-1+ edema Neuro: alert & oriented x 3, cranial nerves grossly intact. moves all 4 extremities w/o difficulty. Affect pleasant.  ECG: pending   ASSESSMENT & PLAN:  1.  Dyspnea on exertion - this has been longstanding complaint for her that preceded her COVID infection. She has had fairly thorough non-invasive w/u to date including CPX testing which has shown only obesity as a major contributing factor.  - That said her symptoms have progressed consistently over the past few years and she now is having exertional chest tightness and at least mild PH on her echo.  - I discussed options with her and Dr. Patsey Berthold and will plan for R/L cath as well as enrollment into exercise program (PREP) - If left-sided pressures on cath, consider SGLT2i  2. OSA on CPAP - continue CPAP  3. Morbid obesity - stressed need for weight loss - Body mass index is 35.45 kg/m.  Glori Bickers, MD  2:39 PM

## 2021-09-01 ENCOUNTER — Ambulatory Visit (HOSPITAL_COMMUNITY): Payer: BC Managed Care – PPO | Admitting: Physical Therapy

## 2021-09-02 ENCOUNTER — Ambulatory Visit (HOSPITAL_COMMUNITY)
Admission: RE | Disposition: A | Discharge status: Home / Self Care | Payer: Self-pay | Source: Home / Self Care | Attending: Internal Medicine

## 2021-09-02 ENCOUNTER — Other Ambulatory Visit: Payer: Self-pay

## 2021-09-02 ENCOUNTER — Ambulatory Visit (HOSPITAL_COMMUNITY)
Admission: RE | Admit: 2021-09-02 | Discharge: 2021-09-02 | Disposition: A | Discharge status: Home / Self Care | Payer: BC Managed Care – PPO | Attending: Internal Medicine | Admitting: Internal Medicine

## 2021-09-02 ENCOUNTER — Encounter (HOSPITAL_COMMUNITY): Payer: Self-pay | Admitting: Internal Medicine

## 2021-09-02 DIAGNOSIS — Z6835 Body mass index (BMI) 35.0-35.9, adult: Secondary | ICD-10-CM | POA: Diagnosis not present

## 2021-09-02 DIAGNOSIS — I5032 Chronic diastolic (congestive) heart failure: Secondary | ICD-10-CM

## 2021-09-02 DIAGNOSIS — F419 Anxiety disorder, unspecified: Secondary | ICD-10-CM | POA: Diagnosis not present

## 2021-09-02 DIAGNOSIS — Z79899 Other long term (current) drug therapy: Secondary | ICD-10-CM | POA: Diagnosis not present

## 2021-09-02 DIAGNOSIS — R0600 Dyspnea, unspecified: Secondary | ICD-10-CM | POA: Diagnosis not present

## 2021-09-02 DIAGNOSIS — R0609 Other forms of dyspnea: Secondary | ICD-10-CM | POA: Insufficient documentation

## 2021-09-02 DIAGNOSIS — Z8616 Personal history of COVID-19: Secondary | ICD-10-CM | POA: Insufficient documentation

## 2021-09-02 DIAGNOSIS — G4733 Obstructive sleep apnea (adult) (pediatric): Secondary | ICD-10-CM | POA: Insufficient documentation

## 2021-09-02 HISTORY — PX: RIGHT/LEFT HEART CATH AND CORONARY ANGIOGRAPHY: CATH118266

## 2021-09-02 LAB — POCT I-STAT EG7
Acid-Base Excess: 1 mmol/L (ref 0.0–2.0)
Acid-base deficit: 1 mmol/L (ref 0.0–2.0)
Acid-base deficit: 2 mmol/L (ref 0.0–2.0)
Bicarbonate: 23.4 mmol/L (ref 20.0–28.0)
Bicarbonate: 24.9 mmol/L (ref 20.0–28.0)
Bicarbonate: 26.7 mmol/L (ref 20.0–28.0)
Calcium, Ion: 0.92 mmol/L — ABNORMAL LOW (ref 1.15–1.40)
Calcium, Ion: 1 mmol/L — ABNORMAL LOW (ref 1.15–1.40)
Calcium, Ion: 1.17 mmol/L (ref 1.15–1.40)
HCT: 33 % — ABNORMAL LOW (ref 36.0–46.0)
HCT: 34 % — ABNORMAL LOW (ref 36.0–46.0)
HCT: 37 % (ref 36.0–46.0)
Hemoglobin: 11.2 g/dL — ABNORMAL LOW (ref 12.0–15.0)
Hemoglobin: 11.6 g/dL — ABNORMAL LOW (ref 12.0–15.0)
Hemoglobin: 12.6 g/dL (ref 12.0–15.0)
O2 Saturation: 77 %
O2 Saturation: 77 %
O2 Saturation: 77 %
Potassium: 3.2 mmol/L — ABNORMAL LOW (ref 3.5–5.1)
Potassium: 3.6 mmol/L (ref 3.5–5.1)
Potassium: 4 mmol/L (ref 3.5–5.1)
Sodium: 140 mmol/L (ref 135–145)
Sodium: 143 mmol/L (ref 135–145)
Sodium: 146 mmol/L — ABNORMAL HIGH (ref 135–145)
TCO2: 25 mmol/L (ref 22–32)
TCO2: 26 mmol/L (ref 22–32)
TCO2: 28 mmol/L (ref 22–32)
pCO2, Ven: 41.1 mmHg — ABNORMAL LOW (ref 44.0–60.0)
pCO2, Ven: 44.6 mmHg (ref 44.0–60.0)
pCO2, Ven: 46.4 mmHg (ref 44.0–60.0)
pH, Ven: 7.354 (ref 7.250–7.430)
pH, Ven: 7.363 (ref 7.250–7.430)
pH, Ven: 7.368 (ref 7.250–7.430)
pO2, Ven: 43 mmHg (ref 32.0–45.0)
pO2, Ven: 43 mmHg (ref 32.0–45.0)
pO2, Ven: 44 mmHg (ref 32.0–45.0)

## 2021-09-02 LAB — POCT I-STAT 7, (LYTES, BLD GAS, ICA,H+H)
Acid-base deficit: 1 mmol/L (ref 0.0–2.0)
Bicarbonate: 23.9 mmol/L (ref 20.0–28.0)
Calcium, Ion: 0.99 mmol/L — ABNORMAL LOW (ref 1.15–1.40)
HCT: 34 % — ABNORMAL LOW (ref 36.0–46.0)
Hemoglobin: 11.6 g/dL — ABNORMAL LOW (ref 12.0–15.0)
O2 Saturation: 96 %
Potassium: 3.6 mmol/L (ref 3.5–5.1)
Sodium: 145 mmol/L (ref 135–145)
TCO2: 25 mmol/L (ref 22–32)
pCO2 arterial: 40.6 mmHg (ref 32.0–48.0)
pH, Arterial: 7.377 (ref 7.350–7.450)
pO2, Arterial: 86 mmHg (ref 83.0–108.0)

## 2021-09-02 SURGERY — RIGHT/LEFT HEART CATH AND CORONARY ANGIOGRAPHY
Anesthesia: LOCAL

## 2021-09-02 MED ORDER — LIDOCAINE HCL (PF) 1 % IJ SOLN
INTRAMUSCULAR | Status: AC
  Filled 2021-09-02: qty 30

## 2021-09-02 MED ORDER — HEPARIN SODIUM (PORCINE) 1000 UNIT/ML IJ SOLN
INTRAMUSCULAR | Status: DC | PRN
  Administered 2021-09-02: 5000 [IU] via INTRAVENOUS

## 2021-09-02 MED ORDER — VERAPAMIL HCL 2.5 MG/ML IV SOLN
INTRAVENOUS | Status: DC | PRN
  Administered 2021-09-02: 10 mL via INTRA_ARTERIAL

## 2021-09-02 MED ORDER — SODIUM CHLORIDE 0.9 % IV SOLN: 250.0000 mL | INTRAVENOUS | Status: DC | PRN

## 2021-09-02 MED ORDER — IOHEXOL 350 MG/ML SOLN
INTRAVENOUS | Status: DC | PRN
  Administered 2021-09-02: 30 mL via INTRA_ARTERIAL

## 2021-09-02 MED ORDER — SODIUM CHLORIDE 0.9 % IV SOLN: INTRAVENOUS | Status: DC

## 2021-09-02 MED ORDER — DOPAMINE-DEXTROSE 3.2-5 MG/ML-% IV SOLN
INTRAVENOUS | Status: AC
  Filled 2021-09-02: qty 250

## 2021-09-02 MED ORDER — ONDANSETRON HCL 4 MG/2ML IJ SOLN: 4.0000 mg | Freq: Four times a day (QID) | INTRAMUSCULAR | Status: DC | PRN

## 2021-09-02 MED ORDER — HYDRALAZINE HCL 20 MG/ML IJ SOLN: 10.0000 mg | INTRAMUSCULAR | Status: DC | PRN

## 2021-09-02 MED ORDER — ASPIRIN 81 MG PO CHEW
81.0000 mg | CHEWABLE_TABLET | ORAL | Status: AC
Start: 2021-09-02 — End: 2021-09-02
  Administered 2021-09-02: 81 mg via ORAL
  Filled 2021-09-02: qty 1

## 2021-09-02 MED ORDER — HEPARIN SODIUM (PORCINE) 1000 UNIT/ML IJ SOLN
INTRAMUSCULAR | Status: AC
  Filled 2021-09-02: qty 1

## 2021-09-02 MED ORDER — MIDAZOLAM HCL 2 MG/2ML IJ SOLN
INTRAMUSCULAR | Status: DC | PRN
  Administered 2021-09-02: 1 mg via INTRAVENOUS

## 2021-09-02 MED ORDER — LIDOCAINE HCL (PF) 1 % IJ SOLN
INTRAMUSCULAR | Status: DC | PRN
  Administered 2021-09-02 (×2): 2 mL

## 2021-09-02 MED ORDER — SODIUM CHLORIDE 0.9% FLUSH: 3.0000 mL | INTRAVENOUS | Status: DC | PRN

## 2021-09-02 MED ORDER — ACETAMINOPHEN 325 MG PO TABS: 650.0000 mg | ORAL_TABLET | ORAL | Status: DC | PRN

## 2021-09-02 MED ORDER — SODIUM CHLORIDE 0.9% FLUSH: 3.0000 mL | Freq: Two times a day (BID) | INTRAVENOUS | Status: DC

## 2021-09-02 MED ORDER — HEPARIN (PORCINE) IN NACL 1000-0.9 UT/500ML-% IV SOLN
INTRAVENOUS | Status: DC | PRN
  Administered 2021-09-02 (×2): 500 mL

## 2021-09-02 MED ORDER — MIDAZOLAM HCL 2 MG/2ML IJ SOLN
INTRAMUSCULAR | Status: AC
  Filled 2021-09-02: qty 2

## 2021-09-02 MED ORDER — ATROPINE SULFATE 1 MG/10ML IJ SOSY
PREFILLED_SYRINGE | INTRAMUSCULAR | Status: AC
  Filled 2021-09-02: qty 20

## 2021-09-02 MED ORDER — VERAPAMIL HCL 2.5 MG/ML IV SOLN
INTRAVENOUS | Status: AC
  Filled 2021-09-02: qty 2

## 2021-09-02 MED ORDER — FENTANYL CITRATE (PF) 100 MCG/2ML IJ SOLN
INTRAMUSCULAR | Status: AC
  Filled 2021-09-02: qty 2

## 2021-09-02 MED ORDER — EPINEPHRINE 1 MG/10ML IJ SOSY
PREFILLED_SYRINGE | INTRAMUSCULAR | Status: AC
  Filled 2021-09-02: qty 20

## 2021-09-02 MED ORDER — FENTANYL CITRATE (PF) 100 MCG/2ML IJ SOLN
INTRAMUSCULAR | Status: DC | PRN
  Administered 2021-09-02: 25 ug via INTRAVENOUS

## 2021-09-02 MED ORDER — HEPARIN (PORCINE) IN NACL 1000-0.9 UT/500ML-% IV SOLN
INTRAVENOUS | Status: AC
  Filled 2021-09-02: qty 1000

## 2021-09-02 SURGICAL SUPPLY — 10 items
CATH 5FR JL3.5 JR4 ANG PIG MP (CATHETERS) ×1 IMPLANT
CATH BALLN WEDGE 5F 110CM (CATHETERS) ×1 IMPLANT
DEVICE RAD COMP TR BAND LRG (VASCULAR PRODUCTS) ×1 IMPLANT
GLIDESHEATH SLEND SS 6F .021 (SHEATH) ×2 IMPLANT
GUIDEWIRE INQWIRE 1.5J.035X260 (WIRE) IMPLANT
INQWIRE 1.5J .035X260CM (WIRE) ×2
KIT HEART LEFT (KITS) ×1 IMPLANT
PACK CARDIAC CATHETERIZATION (CUSTOM PROCEDURE TRAY) ×2 IMPLANT
SHEATH GLIDE SLENDER 4/5FR (SHEATH) ×1 IMPLANT
TRANSDUCER W/STOPCOCK (MISCELLANEOUS) ×2 IMPLANT

## 2021-09-02 NOTE — Interval H&P Note (Signed)
History and Physical Interval Note:  09/02/2021 9:43 AM  Teresa Gallagher  has presented today for surgery, with the diagnosis of heart failure.  The various methods of treatment have been discussed with the patient and family. After consideration of risks, benefits and other options for treatment, the patient has consented to  Procedure(s): RIGHT/LEFT HEART CATH AND CORONARY ANGIOGRAPHY (N/A) and possible coronary angioplasty as a surgical intervention.  The patient's history has been reviewed, patient examined, no change in status, stable for surgery.  I have reviewed the patient's chart and labs.  Questions were answered to the patient's satisfaction.     Harvey Lingo

## 2021-09-03 MED FILL — Atropine Sulfate Soln Prefill Syr 1 MG/10ML (0.1 MG/ML): INTRAMUSCULAR | Qty: 10 | Status: AC

## 2021-09-03 MED FILL — Lidocaine HCl Local Preservative Free (PF) Inj 1%: INTRAMUSCULAR | Qty: 30 | Status: AC

## 2021-09-27 ENCOUNTER — Other Ambulatory Visit: Payer: Self-pay

## 2021-09-27 ENCOUNTER — Encounter: Payer: Self-pay | Admitting: Dietician

## 2021-09-27 ENCOUNTER — Encounter: Payer: BC Managed Care – PPO | Attending: Family Medicine | Admitting: Dietician

## 2021-09-27 ENCOUNTER — Other Ambulatory Visit: Payer: Self-pay | Admitting: Family Medicine

## 2021-09-27 VITALS — Ht 65.0 in | Wt 213.1 lb

## 2021-09-27 DIAGNOSIS — E785 Hyperlipidemia, unspecified: Secondary | ICD-10-CM

## 2021-09-27 DIAGNOSIS — R739 Hyperglycemia, unspecified: Secondary | ICD-10-CM | POA: Diagnosis not present

## 2021-09-27 DIAGNOSIS — E669 Obesity, unspecified: Secondary | ICD-10-CM | POA: Diagnosis not present

## 2021-09-27 DIAGNOSIS — Z6835 Body mass index (BMI) 35.0-35.9, adult: Secondary | ICD-10-CM

## 2021-09-27 DIAGNOSIS — R600 Localized edema: Secondary | ICD-10-CM

## 2021-09-27 NOTE — Patient Instructions (Addendum)
Keep some health frozen meals on hand (like Healthy Choice) for quick meals Finish protein shake after using it as creamer in coffee.  Goal for sodium intake is under 2000mg  daily on average, ideally 1500mg  or less. An average meal that has 500-600mg  sodium would be on target for meeting the daily goal.

## 2021-09-27 NOTE — Progress Notes (Signed)
Medical Nutrition Therapy: Visit start time: 2703  end time: 1445  Assessment:  Diagnosis: dyslipidemia, hyperglycemia, obesity Past medical history: sleep apnea Psychosocial issues/ stress concerns: high stress level, depression -- takes meds regularly and is seeing psychiatrist every 6 weeks  Preferred learning method:  Auditory Visual  Current weight: 213.1lbs Height: 5'5" BMI: 35.46 Medications, supplements: reconciled list in medical record  Progress and evaluation:  Patient reports weight has been 210+ in recent months; was as high as 220lbs at one point.  Voices concern about blood sugars, is checking some fasting BGs at home with most readings over 120 recently. Cholesterol has been elevated for past 2 years per patient. Most recent lab results (08/09/21) indicate total cholesterol 223, HDL 66, LDL 135, triglycerides 116; HbA1C 5.3% Patient's personal goal for her weight is about 170lbs, felt well at that weight. Trying to limit online shopping in favor of in-person to increase activity Depression symptoms include fatigue and feeling overwhelmed. This has made healthy eating habits and regular exercise more difficult.   Physical activity: no structured activity at this time; occasional walking at shopping center, walks dog  Dietary Intake:  Usual eating pattern includes 1-71meals and 1-2 snacks per day. Dining out frequency: 3-4 meals per week.  Breakfast: used to eat regularly; none if son is not home; usually coffee with protein shake used as creamer; vanilla protein shake with frozen mango Snack: none Lunch:  Snack: nuts ie pistachios Supper: 7pm (only time hungry); likes salad, with cheese, berries ; takeout meal (often salad) which lasts for 3 meals  Snack: pistachios or other nuts 1-2oz; loves popcorn (air popped with added butter), or pre-popped cheddar; occasionally ice cream  Beverages: water with some collagen/peptides and electrolyte add-in supplement/ flavoring;  coffee; rarely soda  Nutrition Care Education: Topics covered:  Basic nutrition: basic food groups, appropriate nutrient balance, appropriate meal and snack schedule, general nutrition guidelines    Weight control: importance of low sugar and low fat choices, estimated energy needs for weight loss at 1200kcal, provided guidance for 45% CHO, 25% protein, with limited added fats and sugars Advanced nutrition: meal prepping, simple and quick meal and food options Diabetes prevention:  appropriate meal and snack schedule, appropriate carb intake and balance, healthy carb choices Hypertension:  importance of controlling BP, identifying high sodium foods, identifying food sources of potassium, magnesium Hyperlipidemia:  target goals for lipids, healthy and unhealthy fats, role of exercise Other:  low sodium diet per patient question, with daily goal of 1500-2000mg  or less, 500-600mg  with meals, limiting canned and processed foods  Nutritional Diagnosis:  [place]-2.2 Altered nutrition-related laboratory As related to hyperglycemia and hyperlipidemia.  As evidenced by elevated blood glucose, elevated total cholesterol and LDL. [place]-3.3 Overweight/obesity As related to limited activity due to depression and pain; history of excess calories.  As evidenced by patient with current BMI of 35.46.  Intervention:  Instruction and discussion as noted above. Patient has been limiting food portions without success with weight loss. She is ready to make dietary changes; advised making 1-2 changes at a time to avoid feeling overwhelmed, allowing each positive change to build confidence and motivation for further improvement. Established nutrition goals with direction from patient.  Education Materials given:  Museum/gallery conservator with food lists, sample meal pattern Sample menus Meal Prep handout Visit summary with goals/ instructions   Learner/ who was taught:  Patient    Level of understanding: Verbalizes/  demonstrates competency   Demonstrated degree of understanding via:   Teach back  Learning barriers: None  Willingness to learn/ readiness for change: Eager, change in progress   Monitoring and Evaluation:  Dietary intake, exercise, BG control, blood lipids, and body weight      follow up:  10/18/21 at 9:45am

## 2021-09-30 ENCOUNTER — Other Ambulatory Visit: Payer: Self-pay

## 2021-09-30 ENCOUNTER — Encounter: Payer: Self-pay | Admitting: Physical Therapy

## 2021-09-30 ENCOUNTER — Ambulatory Visit: Payer: BC Managed Care – PPO | Attending: Family Medicine | Admitting: Physical Therapy

## 2021-09-30 DIAGNOSIS — R269 Unspecified abnormalities of gait and mobility: Secondary | ICD-10-CM

## 2021-09-30 DIAGNOSIS — R278 Other lack of coordination: Secondary | ICD-10-CM | POA: Diagnosis present

## 2021-09-30 DIAGNOSIS — M6281 Muscle weakness (generalized): Secondary | ICD-10-CM | POA: Diagnosis present

## 2021-09-30 DIAGNOSIS — R2681 Unsteadiness on feet: Secondary | ICD-10-CM | POA: Diagnosis present

## 2021-09-30 NOTE — Therapy (Signed)
[place] [facility] SERVICES [address] [place], [place], [address] Phone: [phone]   Fax:  [phone]  Physical Therapy Treatment  Patient Details  Name: Teresa Gallagher MRN: [id number] Date of Birth: [birth year] Referring Provider (PT): Steele Sizer   Encounter Date: 09/30/2021   PT End of Session - 09/30/21 1621     Visit Number 1    Number of Visits 16    Date for PT Re-Evaluation 12/09/21    Authorization Time Period 09/30/20-12/09/21    Authorization - Visit Number 0    Progress Note Due on Visit 10    PT Start Time 1540    PT Stop Time 1658    PT Time Calculation (min) 48 min    Equipment Utilized During Treatment Gait belt    Activity Tolerance Patient tolerated treatment well;No increased pain    Behavior During Therapy WFL for tasks assessed/performed             Past Medical History:  Diagnosis Date   Acne    Arrhythmia    Baker's cyst, right 10/2018   calf   Blood transfusion without reported diagnosis    Bunion    Cervicalgia    Chronic kidney disease    Kidney Stents   Depression    Herpes simplex without mention of complication    Mastodynia    Myalgia and myositis, unspecified    Other acne    Other malaise and fatigue    Painful respiration    Palpitations    Seborrhea capitis    Sleep apnea in adult    Symptomatic menopausal or female climacteric states    Unspecified constipation    Unspecified sleep apnea    Unspecified vitamin D deficiency    Vitamin D deficiency     Past Surgical History:  Procedure Laterality Date   BUNIONECTOMY Right 11/07/2014   Dr. Hulda Humphrey at [place] Left 2012   [place]     followed by 3 cuff repairs   LIPOMA EXCISION  2012   REFRACTIVE SURGERY Bilateral    RIGHT/LEFT HEART CATH AND CORONARY ANGIOGRAPHY N/A 09/02/2021   Procedure: RIGHT/LEFT HEART CATH AND CORONARY ANGIOGRAPHY;   Surgeon: Jolaine Artist, MD;  Location: [facility];  Service: Cardiovascular;  Laterality: N/A;   TONSILLECTOMY      There were no vitals filed for this visit.   Subjective Assessment - 09/30/21 1610     Subjective Pt reports she has had 3 falls in the last 6 months. Pt reports MD wanted her to come in for fall prevention. Pt reports when she is falling she stepped out onto her right knee and fell, another fall was when she was stepping up to get into bed and she reports she feels when she steps up with the right LE the L LE teetered. Pt reports she had some rehab following her R meniscal removal in 05/2019. Pt reports she also has back pain which she would like ot learn some core exercises to ipmrove.    Limitations Other (comment)   Unable to perform exercises such as yoga, water aerobics   How long can you sit comfortably? N/A    How long can you stand comfortably? N/A    How long can you walk comfortably? N/A    Patient Stated Goals Pt would like to improve her balance and decrease her fall risk,  strengthen core muscles to improve back pain. Pt would like to get back to yoga type of activities as well as bicycing activities.    Currently in Pain? No/denies   took advil several hours ago to relieve back pain               [facility] PT Assessment - 09/30/21 0001       Assessment   Medical Diagnosis Balance Problems    Referring Provider (PT) Ancil Boozer, Drue Stager    Onset Date/Surgical Date 08/19/18    Prior Therapy None      Balance Screen   Has the patient fallen in the past 6 months Yes    How many times? 3    Has the patient had a decrease in activity level because of a fear of falling?  No    Is the patient reluctant to leave their home because of a fear of falling?  No      Home Environment   Additional Comments 4 stairs to get in on the left      Observation/Other Assessments   Focus on Therapeutic Outcomes (FOTO)  59      ROM / Strength   AROM / PROM /  Strength Strength;AROM      AROM   Overall AROM Comments appears WNL, no formal measurements taken this date      Strength   Strength Assessment Site Knee;Hip;Ankle    Right/Left Hip Right;Left    Right Hip Flexion 4/5    Right Hip ABduction 5/5    Right Hip ADduction 5/5    Left Hip Flexion 4+/5    Right/Left Knee Right;Left    Right Knee Flexion 4/5    Right Knee Extension 4/5    Left Knee Flexion 4/5    Left Knee Extension 4/5    Right/Left Ankle Left;Right    Right Ankle Dorsiflexion 5/5    Right Ankle Plantar Flexion 5/5    Left Ankle Dorsiflexion 5/5    Left Ankle Plantar Flexion 5/5      Transfers   Five time sit to stand comments  10.6 sec      Standardized Balance Assessment   Standardized Balance Assessment Mini-BESTest      Mini-BESTest   Sit To Stand Normal: Comes to stand without use of hands and stabilizes independently.    Rise to Toes Moderate: Heels up, but not full range (smaller than when holding hands), OR noticeable instability for 3 s.    Stand on one leg (left) Normal: 20 s.    Stand on one leg (right) Normal: 20 s.    Stand on one leg - lowest score 2    Compensatory Stepping Correction - Forward Moderate: More than one step is required to recover equilibrium    Compensatory Stepping Correction - Backward No step, OR would fall if not caught, OR falls spontaneously.    Compensatory Stepping Correction - Left Lateral Moderate: Several steps to recover equilibrium    Compensatory Stepping Correction - Right Lateral Normal: Recovers independently with 1 step (crossover or lateral OK)    Stepping Corredtion Lateral - lowest score 1    Stance - Feet together, eyes open, firm surface  Normal: 30s    Stance - Feet together, eyes closed, foam surface  Moderate: < 30s    Incline - Eyes Closed Normal: Stands independently 30s and aligns with gravity    Change in Gait Speed Normal: Significantly changes walkling speed without imbalance    Walk  with head turns  - Horizontal Normal: performs head turns with no change in gait speed and good balance    Walk with pivot turns Moderate:Turns with feet close SLOW (>4 steps) with good balance.    Step over obstacles Moderate: Steps over box but touches box OR displays cautious behavior by slowing gait.    Timed UP & GO with Dual Task Normal: No noticeable change in sitting, standing or walking while backward counting when compared to TUG without   9.51, 10.3   Mini-BEST total score 20                                    PT Education - 09/30/21 1619     Education Details POC    Person(s) Educated Patient    Methods Explanation    Comprehension Verbalized understanding              PT Short Term Goals - 09/30/21 2028       PT SHORT TERM GOAL #1   Title Patient will be independent in home exercise program to improve strength/mobility for better functional independence with ADLs.    Baseline Patient does not have home exercise program    Time 4    Period Weeks    Status New    Target Date 10/28/21      PT SHORT TERM GOAL #2   Title Patient  will complete five times sit to stand test in < 10 seconds indicating an increased LE strength and improved balance.    Baseline 10.6    Time 4    Period Weeks    Status New    Target Date 10/28/21      PT SHORT TERM GOAL #3   Title Patient will deny any falls over past 4 weeks to demonstrate improved safety awareness at home and work.    Baseline Patient has reported 3 falls in the last 6 months    Time 6    Period Weeks    Status New    Target Date 11/11/21               PT Long Term Goals - 09/30/21 2031       PT LONG TERM GOAL #1   Title Patient will increase FOTO score to equal to or greater than  70   to demonstrate statistically significant improvement in mobility and quality of life.    Baseline 59    Time 8    Period Weeks    Status New      PT LONG TERM GOAL #2   Title Patient will improve mini best  score by 6 points or greater in order to indicate decreased risk of falls.    Baseline 20 on 09/30/2021    Time 10    Period Weeks    Status New    Target Date 12/09/21      PT LONG TERM GOAL #3   Title Patient will increase 10 meter walk test to >1.36m/s as to improve gait speed for better community ambulation and to reduce fall risk.    Baseline To measure second visit    Time 8    Period Weeks    Status New    Target Date 11/25/21      PT LONG TERM GOAL #4   Title Patient will a send and descend 8 steps without upper extremity support with  smooth and controlled reciprocal gait pattern in order to improve safety and confidence with ambulation within the community    Baseline Patient reports moderate difficulty with ascending and descending stairs    Time 8    Period Weeks    Status New    Target Date 11/25/21                   Plan - 09/30/21 1624     Clinical Impression Statement Patient is a 60 year old female who presents to physical therapy with complaints of balance issues and frequent falls with reports of 3 falls in the last 6 months, patient reports falls have caused injury to her wrists which are also limiting her in some function at this time.  Patient has a relevant history of right knee pain and meniscectomy.  Patient presents with deficits in bilateral knee and hip strength as well as deficits in balance as evidenced by mini best test results.  Patient has reduced the patient restrictions and her ability to safely exercise without fear of falling.  Patient will benefit from skilled physical therapy services in order to improve her lower extremity strength, decrease her risk of falls, improve her balance.    Personal Factors and Comorbidities Age;Comorbidity 1;Comorbidity 2    Comorbidities Knee OA, Obesity    Examination-Activity Limitations Stairs;Transfers;Squat    Examination-Participation Restrictions Yard Work;Other    Stability/Clinical Decision Making  Stable/Uncomplicated    Clinical Decision Making Moderate    Rehab Potential Good    PT Frequency 2x / week    PT Duration Other (comment)   10 weeks   PT Treatment/Interventions Energy conservation;Dry needling;Spinal Manipulations;Joint Manipulations;Manual techniques;Neuromuscular re-education;Balance training;Therapeutic exercise;Therapeutic activities;Gait training;Stair training;Functional mobility training;Electrical Stimulation;Biofeedback;ADLs/Self Care Home Management    PT Next Visit Plan Establish HEP,    PT Home Exercise Plan To establish next session             Patient will benefit from skilled therapeutic intervention in order to improve the following deficits and impairments:  Decreased balance, Decreased coordination, Decreased endurance, Difficulty walking  Visit Diagnosis: Unsteadiness on feet  Other lack of coordination  Abnormality of gait and mobility  Muscle weakness (generalized)     Problem List Patient Active Problem List   Diagnosis Date Noted   Circadian rhythm sleep disorder, shift work type 03/23/2021   Obstructive sleep apnea syndrome 03/23/2021   Attention and concentration deficit 01/20/2020   Alcohol use disorder, mild, in early remission 10/29/2019   Old tear of meniscus of right knee 03/20/2019   SOB (shortness of breath) 08/29/2017   Episodic tension-type headache, not intractable 12/20/2016   Rib pain on right side 11/27/2015   History of blood transfusion 07/30/2015   GAD (generalized anxiety disorder) 07/30/2015   Chronic constipation 07/13/2015   Insomnia 07/13/2015   MDD (major depressive disorder), recurrent episode, mild ([place]) 07/13/2015   Fibromyalgia syndrome 07/13/2015   Herpes simplex type 2 infection 07/13/2015   Morbid obesity ([place]) 07/13/2015   Hypersomnia with sleep apnea 07/13/2015   Vitamin D deficiency 07/13/2015    Particia Lather, PT 09/30/2021, 8:39 PM  [place]  MAIN [facility] SERVICES [address] [place], [place], [address] Phone: [phone]   Fax:  [phone]  Name: Teresa Gallagher MRN: [id number] Date of Birth: 05-26-1961

## 2021-10-05 ENCOUNTER — Telehealth: Payer: Self-pay | Admitting: Physical Therapy

## 2021-10-05 ENCOUNTER — Ambulatory Visit: Payer: BC Managed Care – PPO | Admitting: Physical Therapy

## 2021-10-05 NOTE — Telephone Encounter (Signed)
Pt called regarding appointment missed on 10/05/21 and reminded of appointment for 10/20 at 4:00 PM. No answer, author left message with the above details.

## 2021-10-07 ENCOUNTER — Encounter: Payer: Self-pay | Admitting: Physical Therapy

## 2021-10-07 ENCOUNTER — Ambulatory Visit: Payer: BC Managed Care – PPO | Admitting: Physical Therapy

## 2021-10-07 ENCOUNTER — Other Ambulatory Visit: Payer: Self-pay

## 2021-10-07 DIAGNOSIS — R278 Other lack of coordination: Secondary | ICD-10-CM

## 2021-10-07 DIAGNOSIS — R2681 Unsteadiness on feet: Secondary | ICD-10-CM | POA: Diagnosis not present

## 2021-10-07 DIAGNOSIS — R269 Unspecified abnormalities of gait and mobility: Secondary | ICD-10-CM

## 2021-10-07 DIAGNOSIS — M6281 Muscle weakness (generalized): Secondary | ICD-10-CM

## 2021-10-07 NOTE — Patient Instructions (Signed)
Access Code: [email or web address] URL: [email or web address] Date: 10/07/2021 Prepared by: Rivka Barbara  Exercises Heel Toe Raises with Counter Support - 1 x daily - 7 x weekly - 2 sets - 12 reps Squat with Counter Support - 1 x daily - 7 x weekly - 2 sets - 10 reps Standing Hip Abduction with Resistance at Ankles and Counter Support - 1 x daily - 7 x weekly - 2 sets - 10 reps Standing Hip Extension with Resistance at Ankles and Counter Support - 1 x daily - 7 x weekly - 2 sets - 10 reps Standing March with Counter Support - 1 x daily - 7 x weekly - 2 sets - 10 reps

## 2021-10-07 NOTE — Therapy (Signed)
[place] [facility] SERVICES [address] [place], [place], [address] Phone: [phone]   Fax:  [phone]  Physical Therapy Treatment  Patient Details  Name: Teresa Gallagher MRN: [id number] Date of Birth: Mar 17, 1961 Referring Provider (PT): Steele Sizer   Encounter Date: 10/07/2021   PT End of Session - 10/07/21 2115     Visit Number 2    Number of Visits 16    Date for PT Re-Evaluation 12/09/21    Authorization Time Period 09/30/20-12/09/21    Authorization - Visit Number 0    Progress Note Due on Visit 10    PT Start Time 3790    PT Stop Time 1645    PT Time Calculation (min) 41 min    Equipment Utilized During Treatment Gait belt    Activity Tolerance Patient tolerated treatment well;No increased pain    Behavior During Therapy WFL for tasks assessed/performed             Past Medical History:  Diagnosis Date   Acne    Arrhythmia    Baker's cyst, right 10/2018   [place]   Blood transfusion without reported diagnosis    Bunion    Cervicalgia    Chronic kidney disease    Kidney Stents   Depression    Herpes simplex without mention of complication    Mastodynia    Myalgia and myositis, unspecified    Other acne    Other malaise and fatigue    Painful respiration    Palpitations    Seborrhea capitis    Sleep apnea in adult    Symptomatic menopausal or female climacteric states    Unspecified constipation    Unspecified sleep apnea    Unspecified vitamin D deficiency    Vitamin D deficiency     Past Surgical History:  Procedure Laterality Date   BUNIONECTOMY Right 11/07/2014   Dr. Hulda Humphrey at [place] Left 2012   [place]     followed by 3 cuff repairs   LIPOMA EXCISION  2012   REFRACTIVE SURGERY Bilateral    RIGHT/LEFT HEART CATH AND CORONARY ANGIOGRAPHY N/A 09/02/2021   Procedure: RIGHT/LEFT HEART CATH AND CORONARY ANGIOGRAPHY;   Surgeon: Jolaine Artist, MD;  Location: [facility];  Service: Cardiovascular;  Laterality: N/A;   TONSILLECTOMY      There were no vitals filed for this visit.   Subjective Assessment - 10/07/21 1608     Subjective Pt reports no sig changes since previous session. Reports increased difficulty with SLS on the left side compared ot the right. min c/o knee pain today but oes have c/o back pain at this time.    Pertinent History Pt reports she has had 3 falls in the last 6 months. Pt reports MD wanted her to come in for fall prevention. Pt reports when she is falling she stepped out onto her right knee and fell, another fall was when she was stepping up to get into bed and she reports she feels when she steps up with the right LE the L LE teetered. Pt reports she had some rehab following her R meniscal removal in 05/2019. Pt reports she also has back pain which she would like ot learn some core exercises to ipmrove.    How long can you sit comfortably? N/A    How long can you stand comfortably? N/A    How  long can you walk comfortably? N/A    Patient Stated Goals Pt would like to improve her balance and decrease her fall risk, strengthen core muscles to improve back pain. Pt would like to get back to yoga type of activities as well as bicycing activities.    Currently in Pain? Yes    Pain Score 3     Pain Location Back    Pain Orientation Lower    Pain Descriptors / Indicators Aching;Cramping;Spasm    Pain Type Chronic pain    Pain Onset More than a month ago    Pain Frequency Intermittent    Aggravating Factors  stannding, walking                        Treatment provided this session  Therex:    Access Code: [email or web address] URL: [email or web address] Date: 10/07/2021 Prepared by: Rivka Barbara  Exercises Heel Toe Raises with Counter Support - 1 x daily - 7 x weekly - 2 sets - 12 reps -Cues for proper control to right loss of balance Squat with  Counter Support - 1 x daily - 7 x weekly - 2 sets - 10 reps Standing Hip Abduction with Resistance at Ankles and Counter Support - 1 x daily - 7 x weekly - 2 sets - 10 reps -Cues for proper hip alignment Standing Hip Extension with Resistance at Ankles and Counter Support - 1 x daily - 7 x weekly - 2 sets - 10 reps -Cues for proper hip alignment Standing March with Counter Support - 1 x daily - 7 x weekly - 2 sets - 10 reps -Cues to prevent excessive upper extremity assistance with this exercise.       Neuro Re- Ed:    SLS progression: To improve lower extremity stability 1 LE on ground, contralateral on 6instep  -2 x 30 sec ea   Step taps 6 in  1x10 ea LE  To improve lower extremity control and balance with stepping up activities as patient reports difficulty with this within her home and community.  Hurdle step overs 10 x ea LE  To improve patient ability to step over objects.  Patient had some difficulty with object clearance and balance when utilizing right lower extremity is stance lower extremity.   Pt required occasional rest breaks due fatigue, PT was quick to ask when pt appeared to be fatiguing in order to prevent excessive fatigue.    Pt educated throughout session about proper posture and technique with exercises. Improved exercise technique, movement at target joints, use of target muscles after min to mod verbal, visual, tactile cues.    Note: Portions of this document were prepared using Dragon voice recognition software and although reviewed may contain unintentional dictation errors in syntax, grammar, or spelling.                PT Short Term Goals - 09/30/21 2028       PT SHORT TERM GOAL #1   Title Patient will be independent in home exercise program to improve strength/mobility for better functional independence with ADLs.    Baseline Patient does not have home exercise program    Time 4    Period Weeks    Status New    Target Date  10/28/21      PT SHORT TERM GOAL #2   Title Patient  will complete five times sit to stand test in < 10 seconds indicating an increased LE strength and improved  balance.    Baseline 10.6    Time 4    Period Weeks    Status New    Target Date 10/28/21      PT SHORT TERM GOAL #3   Title Patient will deny any falls over past 4 weeks to demonstrate improved safety awareness at home and work.    Baseline Patient has reported 3 falls in the last 6 months    Time 6    Period Weeks    Status New    Target Date 11/11/21               PT Long Term Goals - 09/30/21 2031       PT LONG TERM GOAL #1   Title Patient will increase FOTO score to equal to or greater than  70   to demonstrate statistically significant improvement in mobility and quality of life.    Baseline 59    Time 8    Period Weeks    Status New      PT LONG TERM GOAL #2   Title Patient will improve mini best score by 6 points or greater in order to indicate decreased risk of falls.    Baseline 20 on 09/30/2021    Time 10    Period Weeks    Status New    Target Date 12/09/21      PT LONG TERM GOAL #3   Title Patient will increase 10 meter walk test to >1.15m/s as to improve gait speed for better community ambulation and to reduce fall risk.    Baseline To measure second visit    Time 8    Period Weeks    Status New    Target Date 11/25/21      PT LONG TERM GOAL #4   Title Patient will a send and descend 8 steps without upper extremity support with smooth and controlled reciprocal gait pattern in order to improve safety and confidence with ambulation within the community    Baseline Patient reports moderate difficulty with ascending and descending stairs    Time 8    Period Weeks    Status New    Target Date 11/25/21                   Plan - 10/07/21 2115     Clinical Impression Statement Patient presents to initial physical therapy treatment session with great motivation for completion of  therapeutic exercise and other physical therapy activities.  Patient was provided with and thoroughly reviewed home exercise program and provided cues for technique and proper form as well as safety with home exercise program and completing it within her home.  Patient had some complaints of fatigue but overall had great motivation and effort throughout physical therapy session today.  Patient will continue to benefit from skilled physical therapy intervention in order to improve her lower extremity strength, balance, ambulatory capacity, decreased risk of falls, and improve her overall quality of life.    Personal Factors and Comorbidities Age;Comorbidity 1;Comorbidity 2    Comorbidities Knee OA, Obesity    Examination-Activity Limitations Stairs;Transfers;Squat    Examination-Participation Restrictions Tour manager    Stability/Clinical Decision Making Stable/Uncomplicated    Rehab Potential Good    PT Frequency 2x / week    PT Duration Other (comment)   10 weeks   PT Treatment/Interventions Energy conservation;Dry needling;Spinal Manipulations;Joint Manipulations;Manual techniques;Neuromuscular re-education;Balance training;Therapeutic exercise;Therapeutic activities;Gait training;Stair training;Functional mobility training;Electrical Stimulation;Biofeedback;ADLs/Self Care Home Management  PT Next Visit Plan Establish HEP,    PT Home Exercise Plan To establish next session             Patient will benefit from skilled therapeutic intervention in order to improve the following deficits and impairments:  Decreased balance, Decreased coordination, Decreased endurance, Difficulty walking  Visit Diagnosis: Unsteadiness on feet  Other lack of coordination  Abnormality of gait and mobility  Muscle weakness (generalized)     Problem List Patient Active Problem List   Diagnosis Date Noted   Circadian rhythm sleep disorder, shift work type 03/23/2021   Obstructive sleep apnea  syndrome 03/23/2021   Attention and concentration deficit 01/20/2020   Alcohol use disorder, mild, in early remission 10/29/2019   Old tear of meniscus of right knee 03/20/2019   SOB (shortness of breath) 08/29/2017   Episodic tension-type headache, not intractable 12/20/2016   Rib pain on right side 11/27/2015   History of blood transfusion 07/30/2015   GAD (generalized anxiety disorder) 07/30/2015   Chronic constipation 07/13/2015   Insomnia 07/13/2015   MDD (major depressive disorder), recurrent episode, mild ([place]) 07/13/2015   Fibromyalgia syndrome 07/13/2015   Herpes simplex type 2 infection 07/13/2015   Morbid obesity ([place]) 07/13/2015   Hypersomnia with sleep apnea 07/13/2015   Vitamin D deficiency 07/13/2015    Particia Lather, PT 10/07/2021, 9:17 PM  [place] [facility] SERVICES [address] [place], [place], [address] Phone: [phone]   Fax:  [phone]  Name: Teresa Gallagher MRN: [id number] Date of Birth: 04-07-1961

## 2021-10-12 ENCOUNTER — Ambulatory Visit: Payer: BC Managed Care – PPO | Admitting: Physical Therapy

## 2021-10-14 ENCOUNTER — Ambulatory Visit: Payer: BC Managed Care – PPO | Admitting: Physical Therapy

## 2021-10-18 ENCOUNTER — Encounter (INDEPENDENT_AMBULATORY_CARE_PROVIDER_SITE_OTHER): Payer: BC Managed Care – PPO | Admitting: Dietician

## 2021-10-18 ENCOUNTER — Other Ambulatory Visit: Payer: Self-pay

## 2021-10-18 ENCOUNTER — Encounter: Payer: Self-pay | Admitting: Dietician

## 2021-10-18 VITALS — Ht 65.0 in | Wt 205.9 lb

## 2021-10-18 DIAGNOSIS — E669 Obesity, unspecified: Secondary | ICD-10-CM | POA: Diagnosis not present

## 2021-10-18 DIAGNOSIS — R739 Hyperglycemia, unspecified: Secondary | ICD-10-CM | POA: Diagnosis not present

## 2021-10-18 DIAGNOSIS — Z6835 Body mass index (BMI) 35.0-35.9, adult: Secondary | ICD-10-CM | POA: Diagnosis not present

## 2021-10-18 DIAGNOSIS — E785 Hyperlipidemia, unspecified: Secondary | ICD-10-CM | POA: Diagnosis not present

## 2021-10-18 NOTE — Patient Instructions (Addendum)
Establish an eating routine by allowing up to 30 minutes to eat a meal, then put any leftovers away and wait at least 2-3 hours before eating the next snack or meal. Have some vegetarian protein sources such as eggs, nuts, and hummus, beans.  Avoid large portions of cheese on a regular basis due to the high content of saturated fat (increases LDL or "bad" cholesterol). Ideally keep saturated fat to an average of 15g daily or less.  Great job increasing exercise, keep up the awesome work! Keep looking for suitable exercise classes.

## 2021-10-18 NOTE — Progress Notes (Signed)
Medical Nutrition Therapy: Visit start time: 0945  end time: 1015  Assessment:  Diagnosis: hyperlipidemia, hyperglycemia Medical history changes: no changes Psychosocial issues/ stress concerns: high stress level, looking for new therapist (previous therapist retired)  Current weight: 205.9  Height: 5'5" BMI: 34.26 Medications, supplement changes: no changes per patient  Progress and evaluation:  Weight loss of 7.2lbs since previous visit on 09/27/21.  Patient reports increased physical activity, mostly walking. She credits her weight loss to this change. Checked BG sporadically, recent fasting BG was in 90s. Has lost appetite for snacks such as chips, candy, recently discarded bag of candy left by guest.  Has reduced purchase of her favorite [place] to limit consumption. She reports some anxiety/ stress especially early in the day with decreases appetite and makes eating more difficult.   Physical activity: walking 5-7 days a week, ? minutes  Dietary Intake:  Usual eating pattern includes 2 meals and 1-2 snacks per day. Dining out frequency: 3-4 meals per week.  Breakfast: recently not able to eat/ drink protein shake in part due to anxious feelings Snack: none Lunch: difficult to complete a whole meal, tenderloin sandwich with sauteed cauliflower -- lasted 2 days Snack: protein shake usually finishes 1/2 (15g protein) and collagen supplement with water (helps joints) Supper: often salad from [employer] adds extra blueberries, cheese sometimes; likes thin crust pizza with extra veggies Snack: small yogurt drink; protein drink with fruit Beverages: water, some with protein/ collagen; coffee; occasionally Mt. Dew; rarely fruit juice  Nutrition Care Education: Topics covered:  Basic nutrition: basic protein needs and suitable sources Weight control: reviewed progress since previous visit, effects of grazing pattern vs infrequent eating, discussed healthy eating schedule.   hyperglycemia:  limiting sugar sweetened beverages Hyperlipidemia:  healthy and unhealthy fats and goal for limiting saturated fat   Nutritional Diagnosis:  [place]-2.2 Altered nutrition-related laboratory As related to hyperglycemia and hyperlipidemia.  As evidenced by elevated blood glucose, elevated total cholesterol and LDL. [place]-3.3 Overweight/obesity As related to history of limited activity; history of excess calories.  As evidenced by patient with current BMI of 34.26, making diet and lifestyle changes for ongoing weight loss.  Intervention:  Instruction and discussion as noted above. Patient has been implementing positive changes, and is motivated to continue. Updated nutrition goals with direction from patient.   Education Materials given:  Visit summary with goals/ instructions   Learner/ who was taught:  Patient    Level of understanding: Verbalizes/ demonstrates competency   Demonstrated degree of understanding via:   Teach back Learning barriers: None  Willingness to learn/ readiness for change: Eager, change in progress   Monitoring and Evaluation:  Dietary intake, exercise, BG control, blood lipids, and body weight      follow up:  11/17/21 at 10:30am

## 2021-10-19 ENCOUNTER — Ambulatory Visit: Payer: BC Managed Care – PPO | Admitting: Physical Therapy

## 2021-10-21 ENCOUNTER — Ambulatory Visit: Payer: BC Managed Care – PPO | Attending: Family Medicine | Admitting: Physical Therapy

## 2021-10-21 ENCOUNTER — Other Ambulatory Visit: Payer: Self-pay

## 2021-10-21 ENCOUNTER — Encounter: Payer: Self-pay | Admitting: Physical Therapy

## 2021-10-21 DIAGNOSIS — R269 Unspecified abnormalities of gait and mobility: Secondary | ICD-10-CM | POA: Insufficient documentation

## 2021-10-21 DIAGNOSIS — R2681 Unsteadiness on feet: Secondary | ICD-10-CM | POA: Diagnosis present

## 2021-10-21 DIAGNOSIS — R278 Other lack of coordination: Secondary | ICD-10-CM | POA: Diagnosis present

## 2021-10-21 DIAGNOSIS — R262 Difficulty in walking, not elsewhere classified: Secondary | ICD-10-CM | POA: Diagnosis present

## 2021-10-21 DIAGNOSIS — R2689 Other abnormalities of gait and mobility: Secondary | ICD-10-CM | POA: Diagnosis present

## 2021-10-21 DIAGNOSIS — M6281 Muscle weakness (generalized): Secondary | ICD-10-CM | POA: Diagnosis present

## 2021-10-21 NOTE — Therapy (Signed)
[place] MAIN [facility] SERVICES [address] [place], [place], [address] Phone: [phone]   Fax:  [phone]  Physical Therapy Treatment  Patient Details  Name: Teresa Gallagher MRN: [id number] Date of Birth: 02/01/1961 Referring Provider (PT): Steele Sizer   Encounter Date: 10/21/2021   PT End of Session - 10/21/21 1707     Visit Number 3    Number of Visits 16    Date for PT Re-Evaluation 12/09/21    Authorization Time Period 09/30/20-12/09/21    Authorization - Visit Number 0    Progress Note Due on Visit 10    PT Start Time 9767    PT Stop Time 1655    PT Time Calculation (min) 46 min    Equipment Utilized During Treatment Gait belt    Activity Tolerance Patient tolerated treatment well    Behavior During Therapy [facility] for tasks assessed/performed             Past Medical History:  Diagnosis Date   Acne    Arrhythmia    Torrian Canion's cyst, right 10/2018   calf   Blood transfusion without reported diagnosis    Bunion    Cervicalgia    Chronic kidney disease    Kidney Stents   Depression    Herpes simplex without mention of complication    Mastodynia    Myalgia and myositis, unspecified    Other acne    Other malaise and fatigue    Painful respiration    Palpitations    Seborrhea capitis    Sleep apnea in adult    Symptomatic menopausal or female climacteric states    Unspecified constipation    Unspecified sleep apnea    Unspecified vitamin D deficiency    Vitamin D deficiency     Past Surgical History:  Procedure Laterality Date   BUNIONECTOMY Right 11/07/2014   Dr. Hulda Humphrey at [facility] Left 2012   [place]     followed by 3 cuff repairs   LIPOMA EXCISION  2012   REFRACTIVE SURGERY Bilateral    RIGHT/LEFT HEART CATH AND CORONARY ANGIOGRAPHY N/A 09/02/2021   Procedure: RIGHT/LEFT HEART CATH AND CORONARY ANGIOGRAPHY;  Surgeon: Jolaine Artist, MD;  Location: [facility];  Service: Cardiovascular;  Laterality: N/A;   TONSILLECTOMY      There were no vitals filed for this visit.   Subjective Assessment - 10/21/21 1615     Subjective Pt reports no sig changes since previous session. Reports poor night's sleep due to pain from fibromyalgia. States knees feel good however she does endorse back pain.    Pertinent History Pt reports she has had 3 falls in the last 6 months. Pt reports MD wanted her to come in for fall prevention. Pt reports when she is falling she stepped out onto her right knee and fell, another fall was when she was stepping up to get into bed and she reports she feels when she steps up with the right LE the L LE teetered. Pt reports she had some rehab following her R meniscal removal in 05/2019. Pt reports she also has back pain which she would like ot learn some core exercises to ipmrove.    Limitations Other (comment)   Unable to perform exercises such as yoga, water aerobics   How long can you sit comfortably? N/A    How long can you stand  comfortably? N/A    How long can you walk comfortably? N/A    Patient Stated Goals Pt would like to improve her balance and decrease her fall risk, strengthen core muscles to improve back pain. Pt would like to get back to yoga type of activities as well as bicycing activities.    Currently in Pain? Yes    Pain Score 3     Pain Location Back    Pain Orientation Lower    Pain Descriptors / Indicators Aching;Spasm    Pain Type Chronic pain    Pain Onset More than a month ago               INTERVENTIONS  Neuromuscular Re-ed: CGA provided at all times, gait belt donned.  SLS 60 seconds x2 BLE; VC on muscle activation for improved stability SLS with toe taps to cone, x15 taps each side SLS with 3 toe taps to 2 cones (forward-side-forward), x8 reps each side. Limited due to muscular fatigue and mild LBP. Side stepping over 6" hurdle, 2 x 60  seconds.  Step onto/off of airex pad, visual gaze directed down, x10 each side. Step onto/off of airex pad, altered head positions for changes in visual gaze preference - held each head position for 2-3 steps each (head position: neutral, up, down, bilaterally). Difficulty with proprioceptive awareness and occasional decreased foot clearance requiring CGA to steady when stepping backwards off pad. x10-12 reps each side.   Therapeutic Activity: Reviewed and practiced getting in and out of bed after pt reported this was a source of falling. PT educated on an alternate technique - rather than climbing into bed forwards, to sit down on the edge of the bed first. When pt mentioned she climbs in forward due to back pain, PT educated on log roll. Also educated on safe technique to sit on EOB just to elevated bed height. Pt performed full repetition of bed mobility demonstrating understanding.   Therapeutic Exercise: Lateral stepping with GTB around ankles, 2 x 12 each side. Reverse stepping (pattern: diagonal - step to - diagonal - step to) with GTB around ankle, 2 x 10 each side. Forward stepping (pattern: diagonal - step to - diagonal - step to) with GTB around ankle, 2 x 10 each side.    Clinical Impression: Pt demonstrated excellent motivation and participation throughout therapy session. Overall, she had increased difficulty balancing on LLE compared to RLE. With VC on muscle activation, pt stability improved in SLS activities. PT also noted difficulty with altering visual focus - PT added challenge of altering head position and visual focus during stepping activity. PT asked about source of falls and pt reported getting into bed - bed mobility was reviewed and practiced with positive response of pt and good technique demonstrated. Pt will continue to benefit from skilled PT intervention in order to improve her LE strength, balance, ambulatory capacity, decrease risk of falls and increase QOL.               PT Short Term Goals - 09/30/21 2028       PT SHORT TERM GOAL #1   Title Patient will be independent in home exercise program to improve strength/mobility for better functional independence with ADLs.    Baseline Patient does not have home exercise program    Time 4    Period Weeks    Status New    Target Date 10/28/21      PT SHORT TERM GOAL #2   Title Patient  will complete five times sit to stand test in < 10 seconds indicating an increased LE strength and improved balance.    Baseline 10.6    Time 4    Period Weeks    Status New    Target Date 10/28/21      PT SHORT TERM GOAL #3   Title Patient will deny any falls over past 4 weeks to demonstrate improved safety awareness at home and work.    Baseline Patient has reported 3 falls in the last 6 months    Time 6    Period Weeks    Status New    Target Date 11/11/21               PT Long Term Goals - 09/30/21 2031       PT LONG TERM GOAL #1   Title Patient will increase FOTO score to equal to or greater than  70   to demonstrate statistically significant improvement in mobility and quality of life.    Baseline 59    Time 8    Period Weeks    Status New      PT LONG TERM GOAL #2   Title Patient will improve mini best score by 6 points or greater in order to indicate decreased risk of falls.    Baseline 20 on 09/30/2021    Time 10    Period Weeks    Status New    Target Date 12/09/21      PT LONG TERM GOAL #3   Title Patient will increase 10 meter walk test to >1.56m/s as to improve gait speed for better community ambulation and to reduce fall risk.    Baseline To measure second visit    Time 8    Period Weeks    Status New    Target Date 11/25/21      PT LONG TERM GOAL #4   Title Patient will a send and descend 8 steps without upper extremity support with smooth and controlled reciprocal gait pattern in order to improve safety and confidence with ambulation within the community     Baseline Patient reports moderate difficulty with ascending and descending stairs    Time 8    Period Weeks    Status New    Target Date 11/25/21                   Plan - 10/21/21 1707     Clinical Impression Statement Pt demonstrated excellent motivation and participation throughout therapy session. Overall, she had increased difficulty balancing on LLE compared to RLE. With VC on muscle activation, pt stability improved in SLS activities. PT also noted difficulty with altering visual focus - PT added challenge of altering head position and visual focus during stepping activity. PT asked about source of falls and pt reported getting into bed - bed mobility was reviewed and practiced with positive response of pt and good technique demonstrated. Pt will continue to benefit from skilled PT intervention in order to improve her LE strength, balance, ambulatory capacity, decrease risk of falls and increase QOL.    Personal Factors and Comorbidities Age;Comorbidity 1;Comorbidity 2    Comorbidities Knee OA, Obesity    Examination-Activity Limitations Stairs;Transfers;Squat    Examination-Participation Restrictions Yard Work;Other    Stability/Clinical Decision Making Stable/Uncomplicated    Rehab Potential Good    PT Frequency 2x / week    PT Duration Other (comment)   10 weeks   PT Treatment/Interventions Energy  conservation;Dry needling;Spinal Manipulations;Joint Manipulations;Manual techniques;Neuromuscular re-education;Balance training;Therapeutic exercise;Therapeutic activities;Gait training;Stair training;Functional mobility training;Electrical Stimulation;Biofeedback;ADLs/Self Care Home Management    PT Next Visit Plan Establish HEP,    PT Home Exercise Plan To establish next session             Patient will benefit from skilled therapeutic intervention in order to improve the following deficits and impairments:  Decreased balance, Decreased coordination, Decreased endurance,  Difficulty walking  Visit Diagnosis: Abnormality of gait and mobility  Other lack of coordination  Difficulty in walking, not elsewhere classified  Unsteadiness on feet  Muscle weakness (generalized)  Other abnormalities of gait and mobility     Problem List Patient Active Problem List   Diagnosis Date Noted   Circadian rhythm sleep disorder, shift work type 03/23/2021   Obstructive sleep apnea syndrome 03/23/2021   Attention and concentration deficit 01/20/2020   Alcohol use disorder, mild, in early remission 10/29/2019   Old tear of meniscus of right knee 03/20/2019   SOB (shortness of breath) 08/29/2017   Episodic tension-type headache, not intractable 12/20/2016   Rib pain on right side 11/27/2015   History of blood transfusion 07/30/2015   GAD (generalized anxiety disorder) 07/30/2015   Chronic constipation 07/13/2015   Insomnia 07/13/2015   MDD (major depressive disorder), recurrent episode, mild ([place]) 07/13/2015   Fibromyalgia syndrome 07/13/2015   Herpes simplex type 2 infection 07/13/2015   Morbid obesity ([place]) 07/13/2015   Hypersomnia with sleep apnea 07/13/2015   Vitamin D deficiency 07/13/2015    Patrina Levering PT, DPT  [place] [facility] MAIN [facility] SERVICES [address] [place], [place], [address] Phone: [phone]   Fax:  [phone]  Name: Teresa Gallagher MRN: [id number] Date of Birth: [birth year]

## 2021-10-26 ENCOUNTER — Ambulatory Visit: Payer: BC Managed Care – PPO | Admitting: Physical Therapy

## 2021-10-26 ENCOUNTER — Other Ambulatory Visit: Payer: Self-pay

## 2021-10-26 DIAGNOSIS — R269 Unspecified abnormalities of gait and mobility: Secondary | ICD-10-CM

## 2021-10-26 DIAGNOSIS — R2681 Unsteadiness on feet: Secondary | ICD-10-CM

## 2021-10-26 DIAGNOSIS — M6281 Muscle weakness (generalized): Secondary | ICD-10-CM

## 2021-10-26 NOTE — Therapy (Signed)
[place] MAIN [facility] SERVICES [address] [place], [place], [address] Phone: [phone]   Fax:  [phone]  Physical Therapy Treatment  Patient Details  Name: Teresa Gallagher MRN: [id number] Date of Birth: [birth year] Referring Provider (PT): Steele Sizer   Encounter Date: 10/26/2021   PT End of Session - 10/26/21 1559     Visit Number 4    Number of Visits 16    Date for PT Re-Evaluation 12/09/21    Authorization Time Period 09/30/20-12/09/21    Authorization - Visit Number 0    Progress Note Due on Visit 10    PT Start Time 7341    PT Stop Time 1635    PT Time Calculation (min) 41 min    Equipment Utilized During Treatment Gait belt    Activity Tolerance Patient tolerated treatment well    Behavior During Therapy [facility] for tasks assessed/performed             Past Medical History:  Diagnosis Date   Acne    Arrhythmia    Baker's cyst, right 10/2018   calf   Blood transfusion without reported diagnosis    Bunion    Cervicalgia    Chronic kidney disease    Kidney Stents   Depression    Herpes simplex without mention of complication    Mastodynia    Myalgia and myositis, unspecified    Other acne    Other malaise and fatigue    Painful respiration    Palpitations    Seborrhea capitis    Sleep apnea in adult    Symptomatic menopausal or female climacteric states    Unspecified constipation    Unspecified sleep apnea    Unspecified vitamin D deficiency    Vitamin D deficiency     Past Surgical History:  Procedure Laterality Date   BUNIONECTOMY Right 11/07/2014   Dr. Hulda Humphrey at [facility] Left 2012   [place]     followed by 3 cuff repairs   LIPOMA EXCISION  2012   REFRACTIVE SURGERY Bilateral    RIGHT/LEFT HEART CATH AND CORONARY ANGIOGRAPHY N/A 09/02/2021   Procedure: RIGHT/LEFT HEART CATH AND CORONARY ANGIOGRAPHY;  Surgeon: Jolaine Artist, MD;  Location: [place] [facility];  Service: Cardiovascular;  Laterality: N/A;   TONSILLECTOMY      There were no vitals filed for this visit.   Subjective Assessment - 10/26/21 1559     Subjective Pt reports no sig changes since previous session. Reports poor night's sleep due to pain from fibromyalgia. States knees feel good however she does endorse back pain.    Pertinent History Pt reports she has had 3 falls in the last 6 months. Pt reports MD wanted her to come in for fall prevention. Pt reports when she is falling she stepped out onto her right knee and fell, another fall was when she was stepping up to get into bed and she reports she feels when she steps up with the right LE the L LE teetered. Pt reports she had some rehab following her R meniscal removal in 05/2019. Pt reports she also has back pain which she would like ot learn some core exercises to ipmrove.    Limitations Other (comment)   Unable to perform exercises such as yoga, water aerobics   How long can you sit comfortably? N/A    How long can you stand  comfortably? N/A    How long can you walk comfortably? N/A    Patient Stated Goals Pt would like to improve her balance and decrease her fall risk, strengthen core muscles to improve back pain. Pt would like to get back to yoga type of activities as well as bicycing activities.    Pain Onset More than a month ago               Treatment provided this session  SLS with 3 toe taps to 2 cones (forward-side-forward), x8 reps each side. Limited due to muscular fatigue and mild LBP.   Step onto/off of airex pad, visual gaze directed down, x10 each side.  Step onto/off of airex pad, altered head positions for changes in visual gaze preference - held each head position for 2-3 steps each (head position: neutral, up, down, bilaterally).  -completed one set with a/p step ups and one set with lateral step ups     cable column resisted walking with 17.5# x 2 laps  (sidestepping to each side)  -Rated as mod difficulty, to ipmrove M/L stability and strength   Step up with contralateral knee drive to transition to SL stance  -x 10 on ea LE -intermittent use of UE required   Therapeutic Exercise: Lateral stepping with GTB around ankles, 2 x 12 each side, cues for knee flexion to improve quad muscle activation  Reverse stepping (pattern: diagonal - step to - diagonal - step to) with GTB around ankle, 2 x 10 each side. Forward stepping (pattern: diagonal - step to - diagonal - step to) with GTB around ankle, 2 x 10 each side.   Step up up laterally x 10 bilaterally   Nustep level 3 x 5 min LE only , cues to maintain cadence as well as to only utilize LE .      Pt educated throughout session about proper posture and technique with exercises. Improved exercise technique, movement at target joints, use of target muscles after min to mod verbal, visual, tactile cues.                            PT Short Term Goals - 09/30/21 2028       PT SHORT TERM GOAL #1   Title Patient will be independent in home exercise program to improve strength/mobility for better functional independence with ADLs.    Baseline Patient does not have home exercise program    Time 4    Period Weeks    Status New    Target Date 10/28/21      PT SHORT TERM GOAL #2   Title Patient  will complete five times sit to stand test in < 10 seconds indicating an increased LE strength and improved balance.    Baseline 10.6    Time 4    Period Weeks    Status New    Target Date 10/28/21      PT SHORT TERM GOAL #3   Title Patient will deny any falls over past 4 weeks to demonstrate improved safety awareness at home and work.    Baseline Patient has reported 3 falls in the last 6 months    Time 6    Period Weeks    Status New    Target Date 11/11/21               PT Long Term Goals - 09/30/21 2031  PT LONG TERM GOAL #1   Title Patient will  increase FOTO score to equal to or greater than  70   to demonstrate statistically significant improvement in mobility and quality of life.    Baseline 59    Time 8    Period Weeks    Status New      PT LONG TERM GOAL #2   Title Patient will improve mini best score by 6 points or greater in order to indicate decreased risk of falls.    Baseline 20 on 09/30/2021    Time 10    Period Weeks    Status New    Target Date 12/09/21      PT LONG TERM GOAL #3   Title Patient will increase 10 meter walk test to >1.78m/s as to improve gait speed for better community ambulation and to reduce fall risk.    Baseline To measure second visit    Time 8    Period Weeks    Status New    Target Date 11/25/21      PT LONG TERM GOAL #4   Title Patient will a send and descend 8 steps without upper extremity support with smooth and controlled reciprocal gait pattern in order to improve safety and confidence with ambulation within the community    Baseline Patient reports moderate difficulty with ascending and descending stairs    Time 8    Period Weeks    Status New    Target Date 11/25/21                   Plan - 10/26/21 1608     Clinical Impression Statement Pt conitnues to demonstrate excellent motivation with completeion of physical therpay program. Added several exercises and advanced several exercies to improve pt LE strength as well as to to improve her balance and stability. Pt had some difficulty with balance activities involving alteration of her visual inputs but improved with these tasks with increased repetititions. Pt will continue to benefit from skilled PT interventions in order to impove her LE strength, decrease fall risk, and to improve her function and QOL.    Personal Factors and Comorbidities Age;Comorbidity 1;Comorbidity 2    Comorbidities Knee OA, Obesity    Examination-Activity Limitations Stairs;Transfers;Squat    Examination-Participation Restrictions Tour manager     Stability/Clinical Decision Making Stable/Uncomplicated    Rehab Potential Good    PT Frequency 2x / week    PT Duration Other (comment)   10 weeks   PT Treatment/Interventions Energy conservation;Dry needling;Spinal Manipulations;Joint Manipulations;Manual techniques;Neuromuscular re-education;Balance training;Therapeutic exercise;Therapeutic activities;Gait training;Stair training;Functional mobility training;Electrical Stimulation;Biofeedback;ADLs/Self Care Home Management    PT Next Visit Plan continue POC    PT Home Exercise Plan Advanced this session, no handout provided.             Patient will benefit from skilled therapeutic intervention in order to improve the following deficits and impairments:  Decreased balance, Decreased coordination, Decreased endurance, Difficulty walking  Visit Diagnosis: Abnormality of gait and mobility  Muscle weakness (generalized)  Unsteadiness on feet     Problem List Patient Active Problem List   Diagnosis Date Noted   Circadian rhythm sleep disorder, shift work type 03/23/2021   Obstructive sleep apnea syndrome 03/23/2021   Attention and concentration deficit 01/20/2020   Alcohol use disorder, mild, in early remission 10/29/2019   Old tear of meniscus of right knee 03/20/2019   SOB (shortness of breath) 08/29/2017   Episodic tension-type headache, not  intractable 12/20/2016   Rib pain on right side 11/27/2015   History of blood transfusion 07/30/2015   GAD (generalized anxiety disorder) 07/30/2015   Chronic constipation 07/13/2015   Insomnia 07/13/2015   MDD (major depressive disorder), recurrent episode, mild ([place]) 07/13/2015   Fibromyalgia syndrome 07/13/2015   Herpes simplex type 2 infection 07/13/2015   Morbid obesity ([place]) 07/13/2015   Hypersomnia with sleep apnea 07/13/2015   Vitamin D deficiency 07/13/2015    Particia Lather, PT 10/27/2021, 1:45 PM  [place] MAIN [facility]  SERVICES [address] [place], [place], [address] Phone: [phone]   Fax:  [phone]  Name: MICAL BRUN MRN: [id number] Date of Birth: 07/13/1961

## 2021-10-28 ENCOUNTER — Ambulatory Visit: Payer: BC Managed Care – PPO | Admitting: Physical Therapy

## 2021-10-31 ENCOUNTER — Other Ambulatory Visit: Payer: Self-pay | Admitting: Family Medicine

## 2021-10-31 DIAGNOSIS — M62830 Muscle spasm of back: Secondary | ICD-10-CM

## 2021-11-02 ENCOUNTER — Other Ambulatory Visit: Payer: Self-pay

## 2021-11-02 ENCOUNTER — Ambulatory Visit: Payer: BC Managed Care – PPO | Admitting: Physical Therapy

## 2021-11-02 DIAGNOSIS — R269 Unspecified abnormalities of gait and mobility: Secondary | ICD-10-CM | POA: Diagnosis not present

## 2021-11-02 DIAGNOSIS — M6281 Muscle weakness (generalized): Secondary | ICD-10-CM

## 2021-11-02 DIAGNOSIS — R2681 Unsteadiness on feet: Secondary | ICD-10-CM

## 2021-11-02 NOTE — Therapy (Signed)
[place] MAIN [facility] SERVICES [address] [place], [place], [address] Phone: [phone]   Fax:  [phone]  Physical Therapy Treatment  Patient Details  Name: Teresa Gallagher MRN: [id number] Date of Birth: [birth year] Referring Provider (PT): Steele Sizer   Encounter Date: 11/02/2021    Past Medical History:  Diagnosis Date   Acne    Arrhythmia    Baker's cyst, right 10/2018   calf   Blood transfusion without reported diagnosis    Bunion    Cervicalgia    Chronic kidney disease    Kidney Stents   Depression    Herpes simplex without mention of complication    Mastodynia    Myalgia and myositis, unspecified    Other acne    Other malaise and fatigue    Painful respiration    Palpitations    Seborrhea capitis    Sleep apnea in adult    Symptomatic menopausal or female climacteric states    Unspecified constipation    Unspecified sleep apnea    Unspecified vitamin D deficiency    Vitamin D deficiency     Past Surgical History:  Procedure Laterality Date   BUNIONECTOMY Right 11/07/2014   Dr. Hulda Humphrey at [place] Left 2012   [place]     followed by 3 cuff repairs   LIPOMA EXCISION  2012   REFRACTIVE SURGERY Bilateral    RIGHT/LEFT HEART CATH AND CORONARY ANGIOGRAPHY N/A 09/02/2021   Procedure: RIGHT/LEFT HEART CATH AND CORONARY ANGIOGRAPHY;  Surgeon: Jolaine Artist, MD;  Location: [place] [facility];  Service: Cardiovascular;  Laterality: N/A;   TONSILLECTOMY      There were no vitals filed for this visit.   Subjective Assessment - 11/02/21 1615     Subjective Pt reports no sig changes since previous session. Reports poor night's sleep due to pain from fibromyalgia. States knees feel good however she does endorse back pain.    Pertinent History Pt reports she has had 3 falls in the last 6 months. Pt reports MD wanted her to come in for fall  prevention. Pt reports when she is falling she stepped out onto her right knee and fell, another fall was when she was stepping up to get into bed and she reports she feels when she steps up with the right LE the L LE teetered. Pt reports she had some rehab following her R meniscal removal in 05/2019. Pt reports she also has back pain which she would like ot learn some core exercises to ipmrove.    Limitations Other (comment)   Unable to perform exercises such as yoga, water aerobics   How long can you sit comfortably? N/A    How long can you stand comfortably? N/A    How long can you walk comfortably? N/A    Patient Stated Goals Pt would like to improve her balance and decrease her fall risk, strengthen core muscles to improve back pain. Pt would like to get back to yoga type of activities as well as bicycing activities.    Currently in Pain? Yes    Pain Score 3     Pain Location Back    Pain Orientation Lower    Pain Descriptors / Indicators Aching;Spasm    Pain Onset More than a month ago             Treatment provided this session  Neuro  Re- Ed    SLS with 3 toe taps to 3 hedgehogs (1 ant and 2 lateral on ea side) x8 reps each side.   Step up with SLS hold for 2 sec onto airex pad 2 x 10 B - to improve balance and SLS    Cable column resisted walking with 17.5# x 2 laps (sidestepping to each side)  -Rated as mod difficulty, to ipmrove M/L stability and strength    Step up with contralateral knee drive to transition to SL stance  -x 10 on ea LE    Sidestepping with RTB around ankles and on airex balance beam 2 x 4 laps, no UE support  Rocker board x 1 min oriented A/P and 1 oriented to rock laterally  -head turns for both and attempt with goal to recover balance when thrown off by head turns.   Therapeutic Exercise:   Nustep: Level 3 x 5 min cues for maintenance of cadence for muscular endurance and strength       Pt educated throughout session about proper posture  and technique with exercises. Improved exercise technique, movement at target joints, use of target muscles after min to mod verbal, visual, tactile cues.  Pt session was cut a little short today due to pt arriving a few minutes late to scheduled appointment time                             PT Short Term Goals - 09/30/21 2028       PT Badger #1   Title Patient will be independent in home exercise program to improve strength/mobility for better functional independence with ADLs.    Baseline Patient does not have home exercise program    Time 4    Period Weeks    Status New    Target Date 10/28/21      PT SHORT TERM GOAL #2   Title Patient  will complete five times sit to stand test in < 10 seconds indicating an increased LE strength and improved balance.    Baseline 10.6    Time 4    Period Weeks    Status New    Target Date 10/28/21      PT SHORT TERM GOAL #3   Title Patient will deny any falls over past 4 weeks to demonstrate improved safety awareness at home and work.    Baseline Patient has reported 3 falls in the last 6 months    Time 6    Period Weeks    Status New    Target Date 11/11/21               PT Long Term Goals - 09/30/21 2031       PT LONG TERM GOAL #1   Title Patient will increase FOTO score to equal to or greater than  70   to demonstrate statistically significant improvement in mobility and quality of life.    Baseline 59    Time 8    Period Weeks    Status New      PT LONG TERM GOAL #2   Title Patient will improve mini best score by 6 points or greater in order to indicate decreased risk of falls.    Baseline 20 on 09/30/2021    Time 10    Period Weeks    Status New    Target Date 12/09/21      PT LONG  TERM GOAL #3   Title Patient will increase 10 meter walk test to >1.79m/s as to improve gait speed for better community ambulation and to reduce fall risk.    Baseline To measure second visit    Time 8     Period Weeks    Status New    Target Date 11/25/21      PT LONG TERM GOAL #4   Title Patient will a send and descend 8 steps without upper extremity support with smooth and controlled reciprocal gait pattern in order to improve safety and confidence with ambulation within the community    Baseline Patient reports moderate difficulty with ascending and descending stairs    Time 8    Period Weeks    Status New    Target Date 11/25/21                    Patient will benefit from skilled therapeutic intervention in order to improve the following deficits and impairments:     Visit Diagnosis: No diagnosis found.     Problem List Patient Active Problem List   Diagnosis Date Noted   Circadian rhythm sleep disorder, shift work type 03/23/2021   Obstructive sleep apnea syndrome 03/23/2021   Attention and concentration deficit 01/20/2020   Alcohol use disorder, mild, in early remission 10/29/2019   Old tear of meniscus of right knee 03/20/2019   SOB (shortness of breath) 08/29/2017   Episodic tension-type headache, not intractable 12/20/2016   Rib pain on right side 11/27/2015   History of blood transfusion 07/30/2015   GAD (generalized anxiety disorder) 07/30/2015   Chronic constipation 07/13/2015   Insomnia 07/13/2015   MDD (major depressive disorder), recurrent episode, mild ([place]) 07/13/2015   Fibromyalgia syndrome 07/13/2015   Herpes simplex type 2 infection 07/13/2015   Morbid obesity ([place]) 07/13/2015   Hypersomnia with sleep apnea 07/13/2015   Vitamin D deficiency 07/13/2015    Particia Lather, PT 11/02/2021, 4:16 PM  [place] MAIN [facility] SERVICES [address] [place], [place], [address] Phone: [phone]   Fax:  [phone]  Name: DOSHIE MAGGI MRN: [id number] Date of Birth: March 14, 1961

## 2021-11-04 ENCOUNTER — Ambulatory Visit: Payer: BC Managed Care – PPO | Admitting: Physical Therapy

## 2021-11-04 ENCOUNTER — Other Ambulatory Visit: Payer: Self-pay

## 2021-11-09 ENCOUNTER — Ambulatory Visit: Payer: BC Managed Care – PPO | Admitting: Physical Therapy

## 2021-11-09 ENCOUNTER — Other Ambulatory Visit: Payer: Self-pay

## 2021-11-09 DIAGNOSIS — R269 Unspecified abnormalities of gait and mobility: Secondary | ICD-10-CM

## 2021-11-09 DIAGNOSIS — R2681 Unsteadiness on feet: Secondary | ICD-10-CM

## 2021-11-09 DIAGNOSIS — M6281 Muscle weakness (generalized): Secondary | ICD-10-CM

## 2021-11-09 NOTE — Therapy (Signed)
[place] MAIN [facility] SERVICES [address] [place], [place], [address] Phone: [phone]   Fax:  [phone]  Physical Therapy Treatment  Patient Details  Name: Teresa Gallagher MRN: [id number] Date of Birth: September 24, 1961 Referring Provider (PT): Steele Sizer   Encounter Date: 11/09/2021   PT End of Session - 11/09/21 1549     Visit Number 6    Number of Visits 16    Date for PT Re-Evaluation 12/09/21    Authorization Time Period 09/30/20-12/09/21    Authorization - Visit Number 0    Progress Note Due on Visit 10    PT Start Time 3716    PT Stop Time 1630    PT Time Calculation (min) 45 min    Equipment Utilized During Treatment Gait belt    Activity Tolerance Patient tolerated treatment well    Behavior During Therapy [facility] for tasks assessed/performed             Past Medical History:  Diagnosis Date   Acne    Arrhythmia    Baker's cyst, right 10/2018   calf   Blood transfusion without reported diagnosis    Bunion    Cervicalgia    Chronic kidney disease    Kidney Stents   Depression    Herpes simplex without mention of complication    Mastodynia    Myalgia and myositis, unspecified    Other acne    Other malaise and fatigue    Painful respiration    Palpitations    Seborrhea capitis    Sleep apnea in adult    Symptomatic menopausal or female climacteric states    Unspecified constipation    Unspecified sleep apnea    Unspecified vitamin D deficiency    Vitamin D deficiency     Past Surgical History:  Procedure Laterality Date   BUNIONECTOMY Right 11/07/2014   Dr. Hulda Humphrey at [place] Left 2012   [place]     followed by 3 cuff repairs   LIPOMA EXCISION  2012   REFRACTIVE SURGERY Bilateral    RIGHT/LEFT HEART CATH AND CORONARY ANGIOGRAPHY N/A 09/02/2021   Procedure: RIGHT/LEFT HEART CATH AND CORONARY ANGIOGRAPHY;  Surgeon: Jolaine Artist, MD;  Location: [place] [facility];  Service: Cardiovascular;  Laterality: N/A;   TONSILLECTOMY      There were no vitals filed for this visit.   Subjective Assessment - 11/09/21 1547     Subjective Pt reports her low back pain has improved since last week. Reports massage device at home has been helpful.    Pertinent History Pt reports she has had 3 falls in the last 6 months. Pt reports MD wanted her to come in for fall prevention. Pt reports when she is falling she stepped out onto her right knee and fell, another fall was when she was stepping up to get into bed and she reports she feels when she steps up with the right LE the L LE teetered. Pt reports she had some rehab following her R meniscal removal in 05/2019. Pt reports she also has back pain which she would like ot learn some core exercises to ipmrove.    Limitations Other (comment)   Unable to perform exercises such as yoga, water aerobics   How long can you sit comfortably? N/A    How long can you stand comfortably? N/A    How long can  you walk comfortably? N/A    Patient Stated Goals Pt would like to improve her balance and decrease her fall risk, strengthen core muscles to improve back pain. Pt would like to get back to yoga type of activities as well as bicycing activities.    Pain Onset More than a month ago                 Treatment provided this session  Neuro Re- Ed   STS 2 x 12 with airex pad under heels to   SLS on airex  with 3 toe taps to 3 hedgehogs (1 ant and 2 anterolateral on ea side) 2 x 5 reps reps each side.   Step up with SLS hold for 2 sec onto airex pad on 6 inch step 2 x 5 B - to improve balance and SLS, and LE strength    Cable column resisted walking with 17.5# x 2 laps (sidestepping to each side)  -Rated as mod difficulty, to improve M/L stability and strength   Wobble board 2 x 1 min oriented to rock laterally  -head turns for both and attempt with goal to recover balance when  thrown off by head turns.   Therapeutic Exercise:   Nustep: Level 3 x 5 min cues for maintenance of cadence for muscular endurance and strength   STS 1 x 10 without UE support   Sidestepping with RTB around ankles a 2 x 3 laps, no UE support -cues for mini squat throughout to improve quad and glute activation   Pt educated throughout session about proper posture and technique with exercises. Improved exercise technique, movement at target joints, use of target muscles after min to mod verbal, visual, tactile cues.  Unless otherwise stated, SBA was provided and gait belt donned in order to ensure pt safety  Rest breaks provided throughout in order to prevent onset of LBP following session                        PT Education - 11/09/21 1549     Education Details Exercise form and technique    Person(s) Educated Patient    Methods Explanation    Comprehension Verbalized understanding              PT Short Term Goals - 09/30/21 2028       PT SHORT TERM GOAL #1   Title Patient will be independent in home exercise program to improve strength/mobility for better functional independence with ADLs.    Baseline Patient does not have home exercise program    Time 4    Period Weeks    Status New    Target Date 10/28/21      PT SHORT TERM GOAL #2   Title Patient  will complete five times sit to stand test in < 10 seconds indicating an increased LE strength and improved balance.    Baseline 10.6    Time 4    Period Weeks    Status New    Target Date 10/28/21      PT SHORT TERM GOAL #3   Title Patient will deny any falls over past 4 weeks to demonstrate improved safety awareness at home and work.    Baseline Patient has reported 3 falls in the last 6 months    Time 6    Period Weeks    Status New    Target Date 11/11/21  PT Long Term Goals - 09/30/21 2031       PT LONG TERM GOAL #1   Title Patient will increase FOTO score to equal  to or greater than  70   to demonstrate statistically significant improvement in mobility and quality of life.    Baseline 59    Time 8    Period Weeks    Status New      PT LONG TERM GOAL #2   Title Patient will improve mini best score by 6 points or greater in order to indicate decreased risk of falls.    Baseline 20 on 09/30/2021    Time 10    Period Weeks    Status New    Target Date 12/09/21      PT LONG TERM GOAL #3   Title Patient will increase 10 meter walk test to >1.41m/s as to improve gait speed for better community ambulation and to reduce fall risk.    Baseline To measure second visit    Time 8    Period Weeks    Status New    Target Date 11/25/21      PT LONG TERM GOAL #4   Title Patient will a send and descend 8 steps without upper extremity support with smooth and controlled reciprocal gait pattern in order to improve safety and confidence with ambulation within the community    Baseline Patient reports moderate difficulty with ascending and descending stairs    Time 8    Period Weeks    Status New    Target Date 11/25/21                   Plan - 11/09/21 1631     Clinical Impression Statement Pt continues to demonstrate excellent motivation for copmletion of her therapy progrm and exercises. Pt has worked diligently on her HEP and showes marked improvement with each session thus far. Pt able to tolerate higher level balance tasks with improved efficacy this date. Pt still has difficulty with cable walks and shwed fatigue quickly with this activity. Pt was provided with frequent rest breaks this date to decrease probablity of lBP post session. Pt will continue to benefit from skilled PT intervention in order to improve her pain, balance, function, prevent falls and improve her overall QOL.    Personal Factors and Comorbidities Age;Comorbidity 1;Comorbidity 2    Comorbidities Knee OA, Obesity    Examination-Activity Limitations Stairs;Transfers;Squat     Examination-Participation Restrictions Tour manager    Stability/Clinical Decision Making Stable/Uncomplicated    Rehab Potential Good    PT Frequency 2x / week    PT Duration Other (comment)   10 weeks   PT Treatment/Interventions Energy conservation;Dry needling;Spinal Manipulations;Joint Manipulations;Manual techniques;Neuromuscular re-education;Balance training;Therapeutic exercise;Therapeutic activities;Gait training;Stair training;Functional mobility training;Electrical Stimulation;Biofeedback;ADLs/Self Care Home Management    PT Next Visit Plan continue POC    PT Home Exercise Plan No changes this session             Patient will benefit from skilled therapeutic intervention in order to improve the following deficits and impairments:  Decreased balance, Decreased coordination, Decreased endurance, Difficulty walking  Visit Diagnosis: Abnormality of gait and mobility  Muscle weakness (generalized)  Unsteadiness on feet     Problem List Patient Active Problem List   Diagnosis Date Noted   Circadian rhythm sleep disorder, shift work type 03/23/2021   Obstructive sleep apnea syndrome 03/23/2021   Attention and concentration deficit 01/20/2020   Alcohol use disorder, mild,  in early remission 10/29/2019   Old tear of meniscus of right knee 03/20/2019   SOB (shortness of breath) 08/29/2017   Episodic tension-type headache, not intractable 12/20/2016   Rib pain on right side 11/27/2015   History of blood transfusion 07/30/2015   GAD (generalized anxiety disorder) 07/30/2015   Chronic constipation 07/13/2015   Insomnia 07/13/2015   MDD (major depressive disorder), recurrent episode, mild ([place]) 07/13/2015   Fibromyalgia syndrome 07/13/2015   Herpes simplex type 2 infection 07/13/2015   Morbid obesity ([place]) 07/13/2015   Hypersomnia with sleep apnea 07/13/2015   Vitamin D deficiency 07/13/2015    Particia Lather, PT 11/09/2021, 4:34 PM  [place] MAIN [facility] SERVICES [address] [place], [place], [address] Phone: [phone]   Fax:  [phone]  Name: MADGELINE RAYO MRN: [id number] Date of Birth: [birth year]

## 2021-11-15 ENCOUNTER — Encounter: Payer: Self-pay | Admitting: Pulmonary Disease

## 2021-11-15 ENCOUNTER — Other Ambulatory Visit: Payer: Self-pay

## 2021-11-15 ENCOUNTER — Ambulatory Visit (INDEPENDENT_AMBULATORY_CARE_PROVIDER_SITE_OTHER): Payer: BC Managed Care – PPO | Admitting: Pulmonary Disease

## 2021-11-15 ENCOUNTER — Other Ambulatory Visit
Admission: RE | Admit: 2021-11-15 | Discharge: 2021-11-15 | Disposition: A | Discharge status: Home / Self Care | Payer: BC Managed Care – PPO | Attending: Pulmonary Disease | Admitting: Pulmonary Disease

## 2021-11-15 VITALS — BP 100/80 | HR 92 | Temp 97.1°F | Ht 65.0 in | Wt 212.6 lb

## 2021-11-15 DIAGNOSIS — E669 Obesity, unspecified: Secondary | ICD-10-CM

## 2021-11-15 DIAGNOSIS — R5383 Other fatigue: Secondary | ICD-10-CM | POA: Insufficient documentation

## 2021-11-15 DIAGNOSIS — G4733 Obstructive sleep apnea (adult) (pediatric): Secondary | ICD-10-CM | POA: Diagnosis not present

## 2021-11-15 DIAGNOSIS — R0602 Shortness of breath: Secondary | ICD-10-CM

## 2021-11-15 LAB — TSH: TSH: 2.087 u[IU]/mL (ref 0.350–4.500)

## 2021-11-15 LAB — T4, FREE: Free T4: 0.78 ng/dL (ref 0.61–1.12)

## 2021-11-15 NOTE — Progress Notes (Signed)
Subjective:    Patient ID: Teresa Gallagher, female    DOB: December 18, 1961, 60 y.o.   MRN: [id number] Chief Complaint  Patient presents with   Follow-up    Pulmonary htn, sob.    HPI Patient is a 60 year old lifelong never smoker who follows here for the issue of dyspnea on exertion.  She noted worsening after COVID-19 infection in January 2022.  She has had extensive work-up that has been pretty much nonrevealing including a left and right heart cath on 02 September 2021.  The right heart cath was unremarkable.  She had normal hemodynamics normal coronary arteries and no evidence of exercise-induced PAH on the right heart cath portion.  Her sensation of dyspnea is likely related to obesity and deconditioning.  She is having however significant issues with sleep she does not have restorative sleep and it does take her time to fall asleep.  She follows with Dr. Maxwell Caul with regards to obstructive sleep apnea.  Apparently some changes were made to her equipment on her last visit with him which was approximately 3 months ago.  She has never had oxygen saturation checked with her CPAP to see if she is getting adequate oxygenation.  She seems to have a erratic pattern of sleep.  Not had any wheezing, cough, fevers, chills or sweats.  No chest pain.  Her only complaint is that of fatigue and feeling winded when she exerts herself.   Review of Systems A 10 point review of systems was performed and it is as noted above otherwise negative.  Patient Active Problem List   Diagnosis Date Noted   Circadian rhythm sleep disorder, shift work type 03/23/2021   Obstructive sleep apnea syndrome 03/23/2021   Attention and concentration deficit 01/20/2020   Alcohol use disorder, mild, in early remission 10/29/2019   Old tear of meniscus of right knee 03/20/2019   SOB (shortness of breath) 08/29/2017   Episodic tension-type headache, not intractable 12/20/2016   Rib pain on right side 11/27/2015   History of  blood transfusion 07/30/2015   GAD (generalized anxiety disorder) 07/30/2015   Chronic constipation 07/13/2015   Insomnia 07/13/2015   MDD (major depressive disorder), recurrent episode, mild ([place]) 07/13/2015   Fibromyalgia syndrome 07/13/2015   Herpes simplex type 2 infection 07/13/2015   Morbid obesity ([place]) 07/13/2015   Hypersomnia with sleep apnea 07/13/2015   Vitamin D deficiency 07/13/2015   Social History   Tobacco Use   Smoking status: Never   Smokeless tobacco: Never  Substance Use Topics   Alcohol use: Not Currently   No Known Allergies  Current Meds  Medication Sig   albuterol (VENTOLIN HFA) 108 (90 Base) MCG/ACT inhaler TAKE 2 PUFFS BY MOUTH EVERY 6 HOURS AS NEEDED FOR WHEEZE OR SHORTNESS OF BREATH   ARIPiprazole (ABILIFY) 2 MG tablet Take 4 mg by mouth daily.   baclofen (LIORESAL) 10 MG tablet TAKE 1 TABLET BY MOUTH THREE TIMES A DAY AS NEEDED FOR MUSCLE SPASMS   Caffeine 100 MG TABS Take by mouth.   DULoxetine (CYMBALTA) 60 MG capsule Take 1 capsule (60 mg total) by mouth daily. (Patient taking differently: Take 120 mg by mouth daily.)   furosemide (LASIX) 20 MG tablet TAKE 1 TABLET (20 MG TOTAL) BY MOUTH AS NEEDED FOR FLUID OR EDEMA.   Glucos-Chond-Hyal Ac-Ca Fructo (MOVE FREE JOINT HEALTH ADVANCE PO) Take 1 tablet by mouth daily.   Glycerin-Hypromellose-PEG 400 (ARTIFICIAL TEARS) 0.2-0.2-1 % SOLN Place 1 drop into both eyes daily as needed (dry  eyes).   KLOR-CON M20 20 MEQ tablet TAKE 1 TABLET (20 MEQ TOTAL) BY MOUTH DAILY AS NEEDED (TAKE WITH LASIX).   LORazepam (ATIVAN) 1 MG tablet Take 0.5-1 mg by mouth daily as needed for anxiety.   Melatonin 10 MG TABS Take 10 mg by mouth at bedtime as needed (sleep).   Menthol, Topical Analgesic, (BIOFREEZE) 4 % GEL Apply 1 application topically daily as needed (pain).   modafinil (PROVIGIL) 200 MG tablet Take 1 tablet (200 mg total) by mouth daily. (Patient taking differently: Take 200-400 mg by mouth daily.)   Multiple  Vitamin (MULTIVITAMIN) tablet Take 1 tablet by mouth daily.   valACYclovir (VALTREX) 500 MG tablet TAKE 1 TABLET (500 MG TOTAL) BY MOUTH DAILY. AND TWICE DAILY FOR OUTBREAKS (Patient taking differently: Take 500 mg by mouth daily as needed (outbreaks).)   zinc gluconate 50 MG tablet Take 50 mg by mouth daily.   Immunization History  Administered Date(s) Administered   Influenza Split 01/10/2013   Influenza,inj,Quad PF,6+ Mos 09/29/2014, 11/05/2015, 11/25/2016, 10/17/2017, 08/16/2018, 10/12/2020   Influenza-Unspecified 09/29/2014   PFIZER(Purple Top)SARS-COV-2 Vaccination 02/15/2020, 03/07/2020   Tdap 01/10/2013   Zoster Recombinat (Shingrix) 12/03/2020        Objective:   Physical Exam BP 100/80 (BP Location: Left Arm, Patient Position: Sitting, Cuff Size: Normal)   Pulse 92   Temp (!) 97.1 F (36.2 C) (Oral)   Ht 5\' 5"  (1.651 m)   Wt 212 lb 9.6 oz (96.4 kg)   SpO2 98%   BMI 35.38 kg/m  GENERAL: Obese woman, no acute distress.  She is fully ambulatory.  Positional dyspnea. HEAD: Normocephalic, atraumatic.  EYES: Pupils equal, round, reactive to light.  No scleral icterus.  MOUTH: Nose/mouth/throat not examined due to masking requirements for COVID 19. NECK: Supple. No thyromegaly. Trachea midline. No JVD.  No adenopathy. PULMONARY: Good air entry bilaterally.  No adventitious sounds. CARDIOVASCULAR: S1 and S2. Regular rate and rhythm.  No rubs, murmurs or gallops heard. ABDOMEN: Obese otherwise benign. MUSCULOSKELETAL: No joint deformity, no clubbing, no edema.  NEUROLOGIC: No focal deficit, no gait disturbance, speech is fluent.  She does repetitive movements with the hands and handwringing. SKIN: Intact,warm,dry. PSYCH: Mood anxious, behavior normal.      Assessment & Plan:     ICD-10-CM   1. Shortness of breath  R06.02 Pulse oximetry, overnight   Pulmonary evaluation has been nonrevealing Cardiac evaluation nonrevealing Suspect due to obesity/deconditioning    2.  Fatigue  R53.83 TSH + free T4    CANCELED: TSH + free T4    CANCELED: TSH + free T4   Evaluate with thyroid function This may be aggravated by sleep deprivation    3. Obstructive sleep apnea syndrome  G47.33    On CPAP, managed by Dr. Maxwell Caul Check overnight oximetry on CPAP     4. Obesity, Class II, BMI 35-39.9, isolated (see actual BMI)  E66.9    This issue adds complexity to her management     Orders Placed This Encounter  Procedures   TSH + free T4    Standing Status:   Future    Number of Occurrences:   1    Standing Expiration Date:   11/15/2022   Pulse oximetry, overnight    Standing Status:   Future    Standing Expiration Date:   11/15/2022    Scheduling Instructions:     On cpap   We will check thyroid functions, check overnight oximetry on CPAP.  Instructed on proper  sleep hygiene management.  We will see her in follow-up in 2 to 3 months time she is to call sooner should any new problems arise.  Renold Don, MD Advanced Bronchoscopy PCCM [facility]    *This note was dictated using voice recognition software/Dragon.  Despite best efforts to proofread, errors can occur which can change the meaning.  Any change was purely unintentional.

## 2021-11-15 NOTE — Patient Instructions (Signed)
We are going to check Thyroid function, this is a blood test.   We are going to check overnight oximetry on your CPAP this will tell us about your oxygen level when you sleep.  Do not take more than 3 mg of melatonin in the evening and do so at the same time daily for several weeks to see if this helps your sleep pattern.  We will see him in follow-up in 2 to 3 months time call sooner should any new problems arise.

## 2021-11-16 ENCOUNTER — Encounter: Payer: Self-pay | Admitting: Physical Therapy

## 2021-11-16 ENCOUNTER — Ambulatory Visit: Payer: BC Managed Care – PPO | Admitting: Physical Therapy

## 2021-11-16 DIAGNOSIS — R2681 Unsteadiness on feet: Secondary | ICD-10-CM

## 2021-11-16 DIAGNOSIS — M6281 Muscle weakness (generalized): Secondary | ICD-10-CM

## 2021-11-16 DIAGNOSIS — R269 Unspecified abnormalities of gait and mobility: Secondary | ICD-10-CM

## 2021-11-16 NOTE — Therapy (Signed)
[place] MAIN [facility] SERVICES [address] [place], [place], [address] Phone: [phone]   Fax:  [phone]  Physical Therapy Treatment  Patient Details  Name: Teresa Gallagher MRN: [id number] Date of Birth: 10-05-1961 Referring Provider (PT): Steele Sizer   Encounter Date: 11/16/2021   PT End of Session - 11/16/21 1629     Visit Number 7    Number of Visits 16    Date for PT Re-Evaluation 12/09/21    Authorization Time Period 09/30/20-12/09/21    Authorization - Visit Number 0    Progress Note Due on Visit 10    PT Start Time 4765    PT Stop Time 4650    PT Time Calculation (min) 33 min    Equipment Utilized During Treatment Gait belt    Activity Tolerance Patient tolerated treatment well    Behavior During Therapy [facility] for tasks assessed/performed             Past Medical History:  Diagnosis Date   Acne    Arrhythmia    Baker's cyst, right 10/2018   [place]   Blood transfusion without reported diagnosis    Bunion    Cervicalgia    Chronic kidney disease    Kidney Stents   Depression    Herpes simplex without mention of complication    Mastodynia    Myalgia and myositis, unspecified    Other acne    Other malaise and fatigue    Painful respiration    Palpitations    Seborrhea capitis    Sleep apnea in adult    Symptomatic menopausal or female climacteric states    Unspecified constipation    Unspecified sleep apnea    Unspecified vitamin D deficiency    Vitamin D deficiency     Past Surgical History:  Procedure Laterality Date   BUNIONECTOMY Right 11/07/2014   Dr. Hulda Humphrey at [facility] Left 2012   [place]     followed by 3 cuff repairs   LIPOMA EXCISION  2012   REFRACTIVE SURGERY Bilateral    RIGHT/LEFT HEART CATH AND CORONARY ANGIOGRAPHY N/A 09/02/2021   Procedure: RIGHT/LEFT HEART CATH AND CORONARY ANGIOGRAPHY;  Surgeon: Jolaine Artist, MD;  Location: [facility];  Service: Cardiovascular;  Laterality: N/A;   TONSILLECTOMY      There were no vitals filed for this visit.   Subjective Assessment - 11/16/21 1546     Subjective pt reports right shoulder pain at start of session and min c/o LBP at this time. Pt also reports tiredness an dfatigue throughotu session due to difficulty sleeping recently. Pt has mentioned to MD regarding her sleeping difficulties    Pertinent History Pt reports she has had 3 falls in the last 6 months. Pt reports MD wanted her to come in for fall prevention. Pt reports when she is falling she stepped out onto her right knee and fell, another fall was when she was stepping up to get into bed and she reports she feels when she steps up with the right LE the L LE teetered. Pt reports she had some rehab following her R meniscal removal in 05/2019. Pt reports she also has back pain which she would like ot learn some core exercises to ipmrove.    Limitations Other (comment)   Unable to perform exercises such as yoga, water aerobics   How long can you  sit comfortably? N/A    How long can you stand comfortably? N/A    How long can you walk comfortably? N/A    Patient Stated Goals Pt would like to improve her balance and decrease her fall risk, strengthen core muscles to improve back pain. Pt would like to get back to yoga type of activities as well as bicycing activities.    Pain Onset More than a month ago                Treatment provided this session  Neuro Re- Ed   STS 2 x 10 with airex pad under heels to increase surface variability    Cable column resisted walking with 17.5# x 2 laps (sidestepping to each side)  -Rated as mod difficulty, to improve M/L stability and strength   Wobble board 3 x 1 min oriented to rock a/p    Therapeutic Exercise:   Nustep (LE only): Interval training level 1: level 3, 4,5  -1 min: 30 sec intervals  -rated medium, 5:30 total    Sidestepping with GTB around ankles a 2 x 12 laps, no UE support -cues for mini squat throughout to improve quad and glute activation   Pt educated throughout session about proper posture and technique with exercises. Improved exercise technique, movement at target joints, use of target muscles after min to mod verbal, visual, tactile cues.  Unless otherwise stated, SBA was provided and gait belt donned in order to ensure pt safety  Pt session was cut a little short today due to pt arriving a few minutes late to scheduled appointment time  Pt required occasional rest breaks due fatigue, PT was quick to ask when pt appeared to be fatiguing in order to prevent excessive fatigue.                         PT Education - 11/16/21 1628     Education Details Exercise form and technique    Person(s) Educated Patient    Methods Explanation    Comprehension Verbalized understanding              PT Short Term Goals - 09/30/21 2028       PT SHORT TERM GOAL #1   Title Patient will be independent in home exercise program to improve strength/mobility for better functional independence with ADLs.    Baseline Patient does not have home exercise program    Time 4    Period Weeks    Status New    Target Date 10/28/21      PT SHORT TERM GOAL #2   Title Patient  will complete five times sit to stand test in < 10 seconds indicating an increased LE strength and improved balance.    Baseline 10.6    Time 4    Period Weeks    Status New    Target Date 10/28/21      PT SHORT TERM GOAL #3   Title Patient will deny any falls over past 4 weeks to demonstrate improved safety awareness at home and work.    Baseline Patient has reported 3 falls in the last 6 months    Time 6    Period Weeks    Status New    Target Date 11/11/21               PT Long Term Goals - 09/30/21 2031       PT LONG TERM GOAL #1  Title Patient will increase FOTO score to equal to or  greater than  70   to demonstrate statistically significant improvement in mobility and quality of life.    Baseline 59    Time 8    Period Weeks    Status New      PT LONG TERM GOAL #2   Title Patient will improve mini best score by 6 points or greater in order to indicate decreased risk of falls.    Baseline 20 on 09/30/2021    Time 10    Period Weeks    Status New    Target Date 12/09/21      PT LONG TERM GOAL #3   Title Patient will increase 10 meter walk test to >1.4m/s as to improve gait speed for better community ambulation and to reduce fall risk.    Baseline To measure second visit    Time 8    Period Weeks    Status New    Target Date 11/25/21      PT LONG TERM GOAL #4   Title Patient will a send and descend 8 steps without upper extremity support with smooth and controlled reciprocal gait pattern in order to improve safety and confidence with ambulation within the community    Baseline Patient reports moderate difficulty with ascending and descending stairs    Time 8    Period Weeks    Status New    Target Date 11/25/21                   Plan - 11/16/21 1630     Clinical Impression Statement Patient demonstrated good motivation for completion of physical therapy program exercises.  Patient does report significant fatigue and beginning of today's physical therapy session and requested some rest periods throughout session.  Patient also had complaints of low back pain and right shoulder pain prior to onset of session today.  Today session had to be cut a little short patient arriving a few minutes late to physical therapy session patient also reports she is very fatigued during today's session and having shorter session today may have been beneficial for her overall fatigue levels.  Patient reports she will be present for 1 more physical therapy session prior to leaving for [facility] for several months.  Patient has been educated regarding continuing care in the  [place] area and ways to proceed regarding this.  Patient will benefit from continued physical therapy intervention in order to improve her lower extremity strength, balance, decrease her risk of falls and improve her overall quality of life.    Personal Factors and Comorbidities Age;Comorbidity 1;Comorbidity 2    Comorbidities Knee OA, Obesity    Examination-Activity Limitations Stairs;Transfers;Squat    Examination-Participation Restrictions Tour manager    Stability/Clinical Decision Making Stable/Uncomplicated    Rehab Potential Good    PT Frequency 2x / week    PT Duration Other (comment)   10 weeks   PT Treatment/Interventions Energy conservation;Dry needling;Spinal Manipulations;Joint Manipulations;Manual techniques;Neuromuscular re-education;Balance training;Therapeutic exercise;Therapeutic activities;Gait training;Stair training;Functional mobility training;Electrical Stimulation;Biofeedback;ADLs/Self Care Home Management    PT Next Visit Plan continue POC    PT Home Exercise Plan No changes this session             Patient will benefit from skilled therapeutic intervention in order to improve the following deficits and impairments:  Decreased balance, Decreased coordination, Decreased endurance, Difficulty walking  Visit Diagnosis: Abnormality of gait and mobility  Muscle weakness (generalized)  Unsteadiness on  feet     Problem List Patient Active Problem List   Diagnosis Date Noted   Circadian rhythm sleep disorder, shift work type 03/23/2021   Obstructive sleep apnea syndrome 03/23/2021   Attention and concentration deficit 01/20/2020   Alcohol use disorder, mild, in early remission 10/29/2019   Old tear of meniscus of right knee 03/20/2019   SOB (shortness of breath) 08/29/2017   Episodic tension-type headache, not intractable 12/20/2016   Rib pain on right side 11/27/2015   History of blood transfusion 07/30/2015   GAD (generalized anxiety disorder)  07/30/2015   Chronic constipation 07/13/2015   Insomnia 07/13/2015   MDD (major depressive disorder), recurrent episode, mild ([place]) 07/13/2015   Fibromyalgia syndrome 07/13/2015   Herpes simplex type 2 infection 07/13/2015   Morbid obesity ([place]) 07/13/2015   Hypersomnia with sleep apnea 07/13/2015   Vitamin D deficiency 07/13/2015    Particia Lather, PT 11/16/2021, 4:33 PM  [place] [facility]IN [facility] SERVICES [address] [place], [place], [address] Phone: [phone]   Fax:  [phone]  Name: JAENA BROCATO MRN: [id number] Date of Birth: 1961-07-19

## 2021-11-17 ENCOUNTER — Encounter: Payer: BC Managed Care – PPO | Attending: Family Medicine | Admitting: Dietician

## 2021-11-17 ENCOUNTER — Other Ambulatory Visit: Payer: Self-pay

## 2021-11-17 ENCOUNTER — Encounter: Payer: Self-pay | Admitting: Dietician

## 2021-11-17 VITALS — Wt 212.2 lb

## 2021-11-17 DIAGNOSIS — R739 Hyperglycemia, unspecified: Secondary | ICD-10-CM | POA: Diagnosis not present

## 2021-11-17 DIAGNOSIS — Z6835 Body mass index (BMI) 35.0-35.9, adult: Secondary | ICD-10-CM | POA: Diagnosis not present

## 2021-11-17 DIAGNOSIS — E785 Hyperlipidemia, unspecified: Secondary | ICD-10-CM | POA: Diagnosis not present

## 2021-11-17 DIAGNOSIS — E669 Obesity, unspecified: Secondary | ICD-10-CM | POA: Insufficient documentation

## 2021-11-17 NOTE — Progress Notes (Signed)
Medical Nutrition Therapy: Visit start time: 6283  end time: 1715  Assessment:  Diagnosis: dyslipidemia, hyperglycemia, obesity Medical history changes: no changes per patient Psychosocial issues/ stress concerns: still looking for new therapist  Current weight: 212.2lbs Height: 5'5" BMI: 35.31 Medications, supplement changes: no changes  Progress and evaluation:  Wants to have Inspire procedure for her sleep apnea, but needs to be at 32 BMI or less.  Reports less healthy food choices recently due to Thanksgiving holiday, guests in home, and some depression. Back pain has worsened recently, making exercise more difficult.    Physical activity: PT for balance and strength 20 minutes, 5-6 times a week. Less walking due to pain, depression  Dietary Intake:  Usual eating pattern includes 2-3 meals and 1-2 snacks per day. Dining out frequency: several meals per week, but food lasts for 2-3 meals  Breakfast: coffee with collagen powder Snack: none Lunch: salad and 1pc [place] toast Snack: sometimes protein shake Supper: continues to eat salads often with fruit, chicken, occ cheese Snack: small yogurt drink; protein drink with fruit Beverages: few regular sodas due to holiday, compamy in home  Nutrition Care Education: Topics covered:      Weight control: reviewed progress since previous visit; discussed options for increasing exercise/ movement Advanced nutrition:  menu planning using worksheet, for meal prep Diabetes prevention:  appropriate carb intake and balance, healthy carb choices for lower glycemic response Hypertension:  identifying high sodium foods Hyperlipidemia: healthy and unhealthy fats, appropriate food choices   Nutritional Diagnosis:  [place]-2.2 Altered nutrition-related laboratory As related to hyperglycemia and hyperlipidemia.  As evidenced by elevated blood glucose, elevated total cholesterol and LDL. [place]-3.3 Overweight/obesity As related to history of limited activity;  history of excess calories.  As evidenced by patient with current BMI of 35.3, making diet and lifestyle changes for ongoing weight loss.  Intervention:  Instruction and discussion as noted above. Patient has experienced some setbacks in recent weeks, but voices motivation to resume working on diet and lifestyle changes to improve health risk and wellbeing. Updated goals with direction from patient. She will plan to schedule another MNT follow up for 12/2021.  Education Materials given:  Museum/gallery conservator with food lists Sample menus/ menu worksheet Visit summary with goals/ instructions   Learner/ who was taught:  Patient    Level of understanding: Verbalizes/ demonstrates competency  Demonstrated degree of understanding via:   Teach back Learning barriers: None  Willingness to learn/ readiness for change: Eager, change in progress   Monitoring and Evaluation:  Dietary intake, exercise, BG and blood lipids, and body weight      follow up: in 2 month(s)

## 2021-11-17 NOTE — Patient Instructions (Signed)
Measure portions of added fats, and portions of starchy foods. Plan some menus that match foods you like and are balanced. Start some meal prep to have convenient homemade meals on hand.  Resume regular exercise as able. Start with short time, light exercise, and gradually increase time and intensity as energy improves.

## 2021-11-18 ENCOUNTER — Ambulatory Visit: Payer: BC Managed Care – PPO | Admitting: Physical Therapy

## 2021-11-18 ENCOUNTER — Encounter: Payer: Self-pay | Admitting: Pulmonary Disease

## 2021-11-26 ENCOUNTER — Telehealth: Payer: Self-pay

## 2021-11-26 NOTE — Telephone Encounter (Signed)
ONO reviewed by Dr. Verda Cumins need for nocturnal O2.   Patient is aware and voiced her understanding.  Nothing further needed at this time.

## 2021-12-28 ENCOUNTER — Telehealth: Payer: Self-pay

## 2021-12-28 NOTE — Telephone Encounter (Signed)
Copied from [place] [id number]. Topic: General - Other >> Dec 28, 2021 11:02 AM [place], [employer] R wrote: Reason for CRM: pt wanting to know when her next colonoscopy is due. Seeking a call back

## 2021-12-28 NOTE — Telephone Encounter (Signed)
Spoke with patient and relayed information. 

## 2022-01-20 ENCOUNTER — Ambulatory Visit: Payer: BC Managed Care – PPO | Admitting: Pulmonary Disease

## 2022-02-08 ENCOUNTER — Ambulatory Visit: Payer: BC Managed Care – PPO | Admitting: Family Medicine

## 2022-02-18 NOTE — Progress Notes (Signed)
Name: Teresa Gallagher   MRN: [id number]    DOB: 1961/08/05   Date:02/21/2022       Progress Note  Subjective  Chief Complaint  Follow up   HPI  Sleep Apnea: she was diagnosed with OSA in 2016. Reviewed the last  study with her done in 2021 , she states she had an appointment with Dr. Maxwell Caul - sleep medicine  she needs to be on 15 cm H2O. She was recently seen by Dr. Duwayne Heck ( pulmonologist) and had an Echo done that showed increase of pulmonary pressure and was advised to follow up with Dr. Haroldine Laws , she had a cath done 09/22 and negative test with normal EF   SOB/wheezing: seen by pulmonologist and cardiologist, wearing CPAP , she feels like wheezing and sob has improved, she states lasix seems to help with fluid retention and helps with breathing    GAD/Major  recurrent depression: she saw Dr. Shea Evans for the first time Nov 10 th, 2020. But switched to Dr. Toy Care. Since Nov 2021 she has been on FMLA as recommended  Dr. Toy Care , she is still feeling anxious, she takes care of her grown son that has Down's syndrome. She is not sure if she is able to go back to work.=Phq 9 is higher , she has follow up with psychiatrist later today She also has a new therapist and sees her once or twice a week    Morbid obesity  BMI is above 35 with co-morbidities such as   OSA, OA and dyslipidemia. She would like to try medications to keep BMI below 30, She denies personal history of pancreatitis or family history of thyroid cancer. Discussed possible side effects of medication including nausea    Dyslipidemia: not on medication discussed healthy diet  The 10-year ASCVD risk score (Arnett DK, et al., 2019) is: 6.2%   Values used to calculate the score:     Age: 82 years     Sex: Female     Is Non-Hispanic African American: Yes     Diabetic: No     Tobacco smoker: No     Systolic Blood Pressure: 127 mmHg     Is BP treated: No     HDL Cholesterol: 66 mg/dL     Total Cholesterol: 223 mg/dL    Back pain:  used to be intermittent but got more intense and with a lot of spasms and was seen by Dr. Mardelle Matte , had some injection, was given flexeril and took Meloxicam, feeling better now , it was on thoracic and lumbar spine but now only lumbar spine,pain is described as aching. Denies radiculitis   Patient Active Problem List   Diagnosis Date Noted   Circadian rhythm sleep disorder, shift work type 03/23/2021   Obstructive sleep apnea syndrome 03/23/2021   Attention and concentration deficit 01/20/2020   Alcohol use disorder, mild, in early remission 10/29/2019   Old tear of meniscus of right knee 03/20/2019   SOB (shortness of breath) 08/29/2017   Episodic tension-type headache, not intractable 12/20/2016   Rib pain on right side 11/27/2015   History of blood transfusion 07/30/2015   GAD (generalized anxiety disorder) 07/30/2015   Chronic constipation 07/13/2015   Insomnia 07/13/2015   MDD (major depressive disorder), recurrent episode, mild ([place]) 07/13/2015   Fibromyalgia syndrome 07/13/2015   Herpes simplex type 2 infection 07/13/2015   Morbid obesity ([place]) 07/13/2015   Hypersomnia with sleep apnea 07/13/2015   Vitamin D deficiency 07/13/2015    Past  Surgical History:  Procedure Laterality Date   BUNIONECTOMY Right 11/07/2014   Dr. Hulda Humphrey at [place] Left 2012   CESAREAN SECTION     LAPAROSCOPIC HYSTERECTOMY     followed by 3 cuff repairs   LIPOMA EXCISION  2012   REFRACTIVE SURGERY Bilateral    RIGHT/LEFT HEART CATH AND CORONARY ANGIOGRAPHY N/A 09/02/2021   Procedure: RIGHT/LEFT HEART CATH AND CORONARY ANGIOGRAPHY;  Surgeon: Jolaine Artist, MD;  Location: [facility];  Service: Cardiovascular;  Laterality: N/A;   TONSILLECTOMY      Family History  Problem Relation Age of Onset   Heart attack Father    Alcohol abuse Father    COPD Sister        end stages   Bipolar disorder Brother    Depression Brother    Bipolar disorder Brother     Depression Brother    Alcohol abuse Brother    Heart attack Paternal Uncle    Breast cancer Paternal Aunt    Breast cancer Maternal Aunt     Social History   Tobacco Use   Smoking status: Never   Smokeless tobacco: Never  Substance Use Topics   Alcohol use: Not Currently     Current Outpatient Medications:    albuterol (VENTOLIN HFA) 108 (90 Base) MCG/ACT inhaler, TAKE 2 PUFFS BY MOUTH EVERY 6 HOURS AS NEEDED FOR WHEEZE OR SHORTNESS OF BREATH, Disp: 18 each, Rfl: 1   ARIPiprazole (ABILIFY) 2 MG tablet, Take 4 mg by mouth daily., Disp: , Rfl:    baclofen (LIORESAL) 10 MG tablet, TAKE 1 TABLET BY MOUTH THREE TIMES A DAY AS NEEDED FOR MUSCLE SPASMS, Disp: 30 tablet, Rfl: 0   CAPLYTA 42 MG capsule, Take 42 mg by mouth at bedtime., Disp: , Rfl:    DULoxetine (CYMBALTA) 60 MG capsule, Take 1 capsule (60 mg total) by mouth daily. (Patient taking differently: Take 120 mg by mouth daily.), Disp: 30 capsule, Rfl: 1   furosemide (LASIX) 20 MG tablet, TAKE 1 TABLET (20 MG TOTAL) BY MOUTH AS NEEDED FOR FLUID OR EDEMA., Disp: 90 tablet, Rfl: 0   Glucos-Chond-Hyal Ac-Ca Fructo (MOVE FREE JOINT HEALTH ADVANCE PO), Take 1 tablet by mouth daily., Disp: , Rfl:    Glycerin-Hypromellose-PEG 400 (ARTIFICIAL TEARS) 0.2-0.2-1 % SOLN, Place 1 drop into both eyes daily as needed (dry eyes)., Disp: , Rfl:    KLOR-CON M20 20 MEQ tablet, TAKE 1 TABLET (20 MEQ TOTAL) BY MOUTH DAILY AS NEEDED (TAKE WITH LASIX)., Disp: 90 tablet, Rfl: 0   LORazepam (ATIVAN) 1 MG tablet, Take 0.5-1 mg by mouth daily as needed for anxiety., Disp: , Rfl:    meloxicam (MOBIC) 15 MG tablet, Take 15 mg by mouth daily., Disp: , Rfl:    Menthol, Topical Analgesic, (BIOFREEZE) 4 % GEL, Apply 1 application topically daily as needed (pain)., Disp: , Rfl:    modafinil (PROVIGIL) 200 MG tablet, Take 1 tablet (200 mg total) by mouth daily. (Patient taking differently: Take 200-400 mg by mouth daily.), Disp: 30 tablet, Rfl: 3   Multiple Vitamin  (MULTIVITAMIN) tablet, Take 1 tablet by mouth daily., Disp: , Rfl:    triamcinolone acetonide (KENALOG-40) 40 MG/ML injection, SMARTSIG:2 Milliliter(s) IM, Disp: , Rfl:    valACYclovir (VALTREX) 500 MG tablet, TAKE 1 TABLET (500 MG TOTAL) BY MOUTH DAILY. AND TWICE DAILY FOR OUTBREAKS (Patient taking differently: Take 500 mg by mouth daily as needed (outbreaks).), Disp: 115 tablet, Rfl: 1  zinc gluconate 50 MG tablet, Take 50 mg by mouth daily., Disp: , Rfl:   No Known Allergies  I personally reviewed active problem list, medication list, allergies, family history, social history with the patient/caregiver today.   ROS  Constitutional: Negative for fever , positive  weight change.  Respiratory: Negative for cough and shortness of breath.   Cardiovascular: Negative for chest pain or palpitations.  Gastrointestinal: Negative for abdominal pain, no bowel changes.  Musculoskeletal: Negative for gait problem or joint swelling.  Skin: Negative for rash.  Neurological: Negative for dizziness or headache.  No other specific complaints in a complete review of systems (except as listed in HPI above).   Objective  Vitals:   02/21/22 1338  BP: 132/74  Pulse: 89  Resp: 16  SpO2: 98%  Weight: 215 lb (97.5 kg)  Height: 5\' 5"  (1.651 m)    Body mass index is 35.78 kg/m.  Physical Exam  Constitutional: Patient appears well-developed and well-nourished. Obese  No distress.  HEENT: head atraumatic, normocephalic, pupils equal and reactive to light, neck supple Cardiovascular: Normal rate, regular rhythm and normal heart sounds.  No murmur heard. No BLE edema. Pulmonary/Chest: Effort normal and breath sounds normal. No respiratory distress. Abdominal: Soft.  There is no tenderness. Muscular skeletal: negative straight leg raise  Psychiatric: Patient has a normal mood and affect. behavior is normal. Judgment and thought content normal.   PHQ2/9: Depression screen [facility] 2/9 02/21/2022 02/21/2022  09/27/2021 08/09/2021 03/24/2021  Decreased Interest 3 0 3 3 3   Down, Depressed, Hopeless 3 0 3 3 2   PHQ - 2 Score 6 0 6 6 5   Altered sleeping 2 0 2 2 2   Tired, decreased energy 3 0 3 3 3   Change in appetite 2 0 2 2 2   Feeling bad or failure about yourself  2 0 0 1 2  Trouble concentrating 2 0 3 2 3   Moving slowly or fidgety/restless 0 0 2 1 0  Suicidal thoughts 0 0 0 0 0  PHQ-9 Score 17 0 18 17 17   Difficult doing work/chores - - - Very difficult -  Some recent data might be hidden    phq 9 is positive   Fall Risk: Fall Risk  02/21/2022 11/17/2021 10/18/2021 09/27/2021 08/09/2021  Falls in the past year? 0 1 1 1 1   Comment - no falls since previous visit; working on PT for fall prevention no falls since previous visit - -  Number falls in past yr: 0 - - 1 1  Injury with Fall? 0 - - 1 1  Comment - - - torn ligament in left wrist -  Risk for fall due to : No Fall Risks - - History of fall(s);Impaired balance/gait -  Follow up Falls prevention discussed - - Falls evaluation completed -  Comment - - - has appointment this week to address falls -    Functional Status Survey: Is the patient deaf or have difficulty hearing?: No Does the patient have difficulty seeing, even when wearing glasses/contacts?: No Does the patient have difficulty concentrating, remembering, or making decisions?: No Does the patient have difficulty walking or climbing stairs?: No Does the patient have difficulty dressing or bathing?: No Does the patient have difficulty doing errands alone such as visiting a doctor's office or shopping?: No    Assessment & Plan   1. Moderate episode of recurrent major depressive disorder (HCC)  Keep follow up with Dr. Toy Care  2. GAD (generalized anxiety disorder)   3.  Morbid obesity (HCC)  - Liraglutide -Weight Management (SAXENDA) 18 MG/3ML SOPN; Inject 0.6-3 mg into the skin daily.  Dispense: 15 mL; Refill: 0 - Insulin Pen Needle 32G X 6 MM MISC; 1 each by Does not  apply route daily at 12 noon.  Dispense: 100 each; Refill: 1  4. Vitamin D deficiency   5. Primary osteoarthritis of right knee   6. Sleep apnea with hypersomnolence  - modafinil (PROVIGIL) 200 MG tablet; Take 1 tablet (200 mg total) by mouth daily.  Dispense: 30 tablet; Refill: 3  7. Dyslipidemia   8. Lower extremity edema  Non pitting edema   9. Breast cancer screening by mammogram  - MM 3D SCREEN BREAST BILATERAL; Future  10. Chronic bilateral low back pain without sciatica   11. Colon cancer screening  - Ambulatory referral to General Surgery  12. Need for shingles vaccine  - Varicella-zoster vaccine IM

## 2022-02-21 ENCOUNTER — Encounter: Payer: Self-pay | Admitting: Family Medicine

## 2022-02-21 ENCOUNTER — Ambulatory Visit: Payer: BC Managed Care – PPO | Admitting: Family Medicine

## 2022-02-21 VITALS — BP 132/74 | HR 89 | Resp 16 | Ht 65.0 in | Wt 215.0 lb

## 2022-02-21 DIAGNOSIS — R600 Localized edema: Secondary | ICD-10-CM

## 2022-02-21 DIAGNOSIS — F331 Major depressive disorder, recurrent, moderate: Secondary | ICD-10-CM | POA: Diagnosis not present

## 2022-02-21 DIAGNOSIS — F411 Generalized anxiety disorder: Secondary | ICD-10-CM | POA: Diagnosis not present

## 2022-02-21 DIAGNOSIS — G4710 Hypersomnia, unspecified: Secondary | ICD-10-CM

## 2022-02-21 DIAGNOSIS — Z1211 Encounter for screening for malignant neoplasm of colon: Secondary | ICD-10-CM

## 2022-02-21 DIAGNOSIS — M545 Low back pain, unspecified: Secondary | ICD-10-CM

## 2022-02-21 DIAGNOSIS — M1711 Unilateral primary osteoarthritis, right knee: Secondary | ICD-10-CM

## 2022-02-21 DIAGNOSIS — E559 Vitamin D deficiency, unspecified: Secondary | ICD-10-CM | POA: Diagnosis not present

## 2022-02-21 DIAGNOSIS — Z23 Encounter for immunization: Secondary | ICD-10-CM

## 2022-02-21 DIAGNOSIS — G8929 Other chronic pain: Secondary | ICD-10-CM

## 2022-02-21 DIAGNOSIS — Z1231 Encounter for screening mammogram for malignant neoplasm of breast: Secondary | ICD-10-CM

## 2022-02-21 DIAGNOSIS — E785 Hyperlipidemia, unspecified: Secondary | ICD-10-CM

## 2022-02-21 DIAGNOSIS — G4730 Sleep apnea, unspecified: Secondary | ICD-10-CM

## 2022-02-21 MED ORDER — MODAFINIL 200 MG PO TABS: 200.0000 mg | ORAL_TABLET | Freq: Every day | ORAL | 3 refills | Status: DC

## 2022-02-21 MED ORDER — INSULIN PEN NEEDLE 32G X 6 MM MISC: 1.0000 | Freq: Every day | 1 refills | Status: DC

## 2022-02-21 MED ORDER — SAXENDA 18 MG/3ML ~~LOC~~ SOPN: 0.6000 mg | PEN_INJECTOR | Freq: Every day | SUBCUTANEOUS | 0 refills | Status: DC

## 2022-02-21 NOTE — Patient Instructions (Signed)
Weight loss medication: check coverage Wegovy or saxenda  ?

## 2022-02-24 ENCOUNTER — Ambulatory Visit: Payer: BC Managed Care – PPO | Admitting: Pulmonary Disease

## 2022-03-27 ENCOUNTER — Other Ambulatory Visit: Payer: Self-pay | Admitting: Family Medicine

## 2022-03-28 ENCOUNTER — Other Ambulatory Visit: Payer: Self-pay

## 2022-03-30 ENCOUNTER — Telehealth: Payer: Self-pay | Admitting: Family Medicine

## 2022-03-30 NOTE — Telephone Encounter (Signed)
Copied from [place] [id number]. Topic: General - Other ?>> Mar 30, 2022  3:39 PM Leward Quan A wrote: ?Reason for CRM: Melanie with Dr Willette Cluster office called in to inform Dr Ancil Boozer that they have received a referral for patient on 02/22/22 for patient to follow up having to do with a colonoscopy, they reached out to patient on that date 3/7/ on 03/08/22 and again 03/30/22 but no answer or call back. Per Threasa Beards just wanted to inform Dr Ancil Boozer and referral coordinator if there are any questions please call Threasa Beards at Ph# [phone] ?

## 2022-03-31 ENCOUNTER — Telehealth: Payer: Self-pay

## 2022-03-31 NOTE — Telephone Encounter (Signed)
Attempted to contact patient, lvm.  ?

## 2022-03-31 NOTE — Telephone Encounter (Signed)
Left voicemail for return call to follow up on colonoscopy referral/scheduling information.  ?

## 2022-05-04 ENCOUNTER — Other Ambulatory Visit: Payer: Self-pay | Admitting: Internal Medicine

## 2022-05-05 NOTE — Telephone Encounter (Signed)
Requested Prescriptions  Pending Prescriptions Disp Refills  . SAXENDA 18 MG/3ML SOPN [Pharmacy Med Name: SAXENDA 18 MG/3 ML PEN] 15 mL 0    Sig: INJECT 0.6-3 MG INTO THE SKIN DAILY.     Endocrinology:  Diabetes - GLP-1 Receptor Agonists Failed - 05/04/2022  1:34 PM      Failed - HBA1C is between 0 and 7.9 and within 180 days    Hgb A1c MFr Bld  Date Value Ref Range Status  08/09/2021 5.3 <5.7 % of total Hgb Final    Comment:    For the purpose of screening for the presence of diabetes: . <5.7%       Consistent with the absence of diabetes 5.7-6.4%    Consistent with increased risk for diabetes             (prediabetes) > or =6.5%  Consistent with diabetes . This assay result is consistent with a decreased risk of diabetes. . Currently, no consensus exists regarding use of hemoglobin A1c for diagnosis of diabetes in children. . According to American Diabetes Association (ADA) guidelines, hemoglobin A1c <7.0% represents optimal control in non-pregnant diabetic patients. Different metrics may apply to specific patient populations.  Standards of Medical Care in Diabetes(ADA). Renella Cunas - Valid encounter within last 6 months    Recent Outpatient Visits          2 months ago Moderate episode of recurrent major depressive disorder [facility])   [facility] Steele Sizer, MD   8 months ago Well adult exam   [facility] Steele Sizer, MD   1 year ago Dyslipidemia   [facility] [place], Drue Stager, MD   1 year ago Moderate episode of recurrent major depressive disorder [facility])   [facility] Steele Sizer, MD   1 year ago Moderate episode of recurrent major depressive disorder [facility])   [facility] Steele Sizer, MD      Future Appointments            In 3 weeks Ancil Boozer, Drue Stager, MD [facility]

## 2022-05-31 ENCOUNTER — Ambulatory Visit: Payer: BC Managed Care – PPO | Admitting: Family Medicine

## 2022-05-31 NOTE — Progress Notes (Deleted)
Name: Teresa Gallagher   MRN: [id number]    DOB: [birth year]   Date:05/31/2022       Progress Note  Subjective  Chief Complaint  Follow Up  HPI  Sleep Apnea: she was diagnosed with OSA in 2016. Reviewed the last  study with her done in 2021 , she states she had an appointment with Dr. Maxwell Caul - sleep medicine  she needs to be on 15 cm H2O. She was recently seen by Dr. Duwayne Heck ( pulmonologist) and had an Echo done that showed increase of pulmonary pressure and was advised to follow up with Dr. Haroldine Laws , she had a cath done 09/22 and negative test with normal EF   SOB/wheezing: seen by pulmonologist and cardiologist, wearing CPAP , she feels like wheezing and sob has improved, she states lasix seems to help with fluid retention and helps with breathing    GAD/Major  recurrent depression: she saw Dr. Shea Evans for the first time Nov 10 th, 2020. But switched to Dr. Toy Care. Since Nov 2021 she has been on FMLA as recommended  Dr. Toy Care , she is still feeling anxious, she takes care of her grown son that has Down's syndrome. She is not sure if she is able to go back to work.=Phq 9 is higher , she has follow up with psychiatrist later today She also has a new therapist and sees her once or twice a week    Morbid obesity  BMI is above 35 with co-morbidities such as   OSA, OA and dyslipidemia. She would like to try medications to keep BMI below 30, She denies personal history of pancreatitis or family history of thyroid cancer. Discussed possible side effects of medication including nausea    Dyslipidemia: not on medication discussed healthy diet  The 10-year ASCVD risk score (Arnett DK, et al., 2019) is: 6.2%   Values used to calculate the score:     Age: 61 years     Sex: Female     Is Non-Hispanic African American: Yes     Diabetic: No     Tobacco smoker: No     Systolic Blood Pressure: 518 mmHg     Is BP treated: No     HDL Cholesterol: 66 mg/dL     Total Cholesterol: 223 mg/dL    Back pain:  used to be intermittent but got more intense and with a lot of spasms and was seen by Dr. Mardelle Matte , had some injection, was given flexeril and took Meloxicam, feeling better now , it was on thoracic and lumbar spine but now only lumbar spine,pain is described as aching. Denies radiculitis   Patient Active Problem List   Diagnosis Date Noted   Circadian rhythm sleep disorder, shift work type 03/23/2021   Obstructive sleep apnea syndrome 03/23/2021   Attention and concentration deficit 01/20/2020   Alcohol use disorder, mild, in early remission 10/29/2019   Old tear of meniscus of right knee 03/20/2019   SOB (shortness of breath) 08/29/2017   Episodic tension-type headache, not intractable 12/20/2016   Rib pain on right side 11/27/2015   History of blood transfusion 07/30/2015   GAD (generalized anxiety disorder) 07/30/2015   Chronic constipation 07/13/2015   Insomnia 07/13/2015   MDD (major depressive disorder), recurrent episode, mild ([place]) 07/13/2015   Fibromyalgia syndrome 07/13/2015   Herpes simplex type 2 infection 07/13/2015   Morbid obesity ([place]) 07/13/2015   Hypersomnia with sleep apnea 07/13/2015   Vitamin D deficiency 07/13/2015    Past Surgical  History:  Procedure Laterality Date   BUNIONECTOMY Right 11/07/2014   Dr. Hulda Humphrey at [place] Left 2012   CESAREAN SECTION     LAPAROSCOPIC HYSTERECTOMY     followed by 3 cuff repairs   LIPOMA EXCISION  2012   REFRACTIVE SURGERY Bilateral    RIGHT/LEFT HEART CATH AND CORONARY ANGIOGRAPHY N/A 09/02/2021   Procedure: RIGHT/LEFT HEART CATH AND CORONARY ANGIOGRAPHY;  Surgeon: Jolaine Artist, MD;  Location: [place] [facility];  Service: Cardiovascular;  Laterality: N/A;   TONSILLECTOMY      Family History  Problem Relation Age of Onset   Heart attack Father    Alcohol abuse Father    COPD Sister        end stages   Bipolar disorder Brother    Depression Brother    Bipolar disorder Brother     Depression Brother    Alcohol abuse Brother    Heart attack Paternal Uncle    Breast cancer Paternal Aunt    Breast cancer Maternal Aunt     Social History   Tobacco Use   Smoking status: Never   Smokeless tobacco: Never  Substance Use Topics   Alcohol use: Not Currently     Current Outpatient Medications:    albuterol (VENTOLIN HFA) 108 (90 Base) MCG/ACT inhaler, TAKE 2 PUFFS BY MOUTH EVERY 6 HOURS AS NEEDED FOR WHEEZE OR SHORTNESS OF BREATH, Disp: 18 each, Rfl: 1   ARIPiprazole (ABILIFY) 2 MG tablet, Take 4 mg by mouth daily., Disp: , Rfl:    baclofen (LIORESAL) 10 MG tablet, TAKE 1 TABLET BY MOUTH THREE TIMES A DAY AS NEEDED FOR MUSCLE SPASMS, Disp: 30 tablet, Rfl: 0   CAPLYTA 42 MG capsule, Take 42 mg by mouth at bedtime., Disp: , Rfl:    DULoxetine (CYMBALTA) 60 MG capsule, Take 1 capsule (60 mg total) by mouth daily. (Patient taking differently: Take 120 mg by mouth daily.), Disp: 30 capsule, Rfl: 1   furosemide (LASIX) 20 MG tablet, TAKE 1 TABLET (20 MG TOTAL) BY MOUTH AS NEEDED FOR FLUID OR EDEMA., Disp: 90 tablet, Rfl: 0   Glucos-Chond-Hyal Ac-Ca Fructo (MOVE FREE JOINT HEALTH ADVANCE PO), Take 1 tablet by mouth daily., Disp: , Rfl:    Glycerin-Hypromellose-PEG 400 (ARTIFICIAL TEARS) 0.2-0.2-1 % SOLN, Place 1 drop into both eyes daily as needed (dry eyes)., Disp: , Rfl:    Insulin Pen Needle 32G X 6 MM MISC, 1 each by Does not apply route daily at 12 noon., Disp: 100 each, Rfl: 1   KLOR-CON M20 20 MEQ tablet, TAKE 1 TABLET (20 MEQ TOTAL) BY MOUTH DAILY AS NEEDED (TAKE WITH LASIX)., Disp: 90 tablet, Rfl: 0   LORazepam (ATIVAN) 1 MG tablet, Take 0.5-1 mg by mouth daily as needed for anxiety., Disp: , Rfl:    meloxicam (MOBIC) 15 MG tablet, Take 15 mg by mouth daily., Disp: , Rfl:    Menthol, Topical Analgesic, (BIOFREEZE) 4 % GEL, Apply 1 application topically daily as needed (pain)., Disp: , Rfl:    modafinil (PROVIGIL) 200 MG tablet, Take 1 tablet (200 mg total) by mouth  daily., Disp: 30 tablet, Rfl: 3   Multiple Vitamin (MULTIVITAMIN) tablet, Take 1 tablet by mouth daily., Disp: , Rfl:    SAXENDA 18 MG/3ML SOPN, INJECT 0.6-3 MG INTO THE SKIN DAILY., Disp: 15 mL, Rfl: 0   triamcinolone acetonide (KENALOG-40) 40 MG/ML injection, SMARTSIG:2 Milliliter(s) IM, Disp: , Rfl:    valACYclovir (VALTREX)  500 MG tablet, TAKE 1 TABLET (500 MG TOTAL) BY MOUTH DAILY. AND TWICE DAILY FOR OUTBREAKS (Patient taking differently: Take 500 mg by mouth daily as needed (outbreaks).), Disp: 115 tablet, Rfl: 1   zinc gluconate 50 MG tablet, Take 50 mg by mouth daily., Disp: , Rfl:   No Known Allergies  I personally reviewed active problem list, medication list, allergies, family history, social history, health maintenance with the patient/caregiver today.   ROS  ***  Objective  There were no vitals filed for this visit.  There is no height or weight on file to calculate BMI.  Physical Exam ***  No results found for this or any previous visit (from the past 2160 hour(s)).   PHQ2/9:    02/21/2022    1:45 PM 02/21/2022    1:38 PM 09/27/2021    1:55 PM 08/09/2021    9:14 AM 03/24/2021    8:07 AM  Depression screen PHQ 2/9  Decreased Interest 3 0 '3 3 3  '$ Down, Depressed, Hopeless 3 0 '3 3 2  '$ PHQ - 2 Score 6 0 '6 6 5  '$ Altered sleeping 2 0 '2 2 2  '$ Tired, decreased energy 3 0 '3 3 3  '$ Change in appetite 2 0 '2 2 2  '$ Feeling bad or failure about yourself  2 0 0 1 2  Trouble concentrating 2 0 '3 2 3  '$ Moving slowly or fidgety/restless 0 0 2 1 0  Suicidal thoughts 0 0 0 0 0  PHQ-9 Score 17 0 '18 17 17  '$ Difficult doing work/chores    Very difficult     phq 9 is {gen pos [id number]}   Fall Risk:    02/21/2022    1:37 PM 11/17/2021    4:43 PM 10/18/2021    9:47 AM 09/27/2021    1:54 PM 08/09/2021    9:12 AM  Fall Risk   Falls in the past year? 0 '1 1 1 1  '$ Comment  no falls since previous visit; working on PT for fall prevention no falls since previous visit    Number falls in  past yr: 0   1 1  Injury with Fall? 0   1 1  Comment    torn ligament in left wrist   Risk for fall due to : No Fall Risks   History of fall(s);Impaired balance/gait   Follow up Falls prevention discussed   Falls evaluation completed   Comment    has appointment this week to address falls       Functional Status Survey:      Assessment & Plan  *** There are no diagnoses linked to this encounter.

## 2022-06-09 NOTE — Progress Notes (Unsigned)
Name: Teresa Gallagher   MRN: [id number]    DOB: 1961/04/21   Date:06/09/2022       Progress Note  Subjective  Chief Complaint  Follow Up  HPI  Sleep Apnea: she was diagnosed with OSA in 2016. Reviewed the last  study with her done in 2021 , she states she had an appointment with Dr. Maxwell Caul - sleep medicine  she needs to be on 15 cm H2O. She was recently seen by Dr. Duwayne Heck ( pulmonologist) and had an Echo done that showed increase of pulmonary pressure and was advised to follow up with Dr. Haroldine Laws , she had a cath done 09/22 and negative test with normal EF   SOB/wheezing: seen by pulmonologist and cardiologist, wearing CPAP , she feels like wheezing and sob has improved, she states lasix seems to help with fluid retention and helps with breathing    GAD/Major  recurrent depression: she saw Dr. Shea Evans for the first time Nov 10 th, 2020. But switched to Dr. Toy Care. Since Nov 2021 she has been on FMLA as recommended  Dr. Toy Care , she is still feeling anxious, she takes care of her grown son that has Down's syndrome. She is not sure if she is able to go back to work.=Phq 9 is higher , she has follow up with psychiatrist later today She also has a new therapist and sees her once or twice a week    Morbid obesity  BMI is above 35 with co-morbidities such as   OSA, OA and dyslipidemia. She would like to try medications to keep BMI below 30, She denies personal history of pancreatitis or family history of thyroid cancer. Discussed possible side effects of medication including nausea    Dyslipidemia: not on medication discussed healthy diet  The 10-year ASCVD risk score (Arnett DK, et al., 2019) is: 6.2%   Values used to calculate the score:     Age: 61 years     Sex: Female     Is Non-Hispanic African American: Yes     Diabetic: No     Tobacco smoker: No     Systolic Blood Pressure: 789 mmHg     Is BP treated: No     HDL Cholesterol: 66 mg/dL     Total Cholesterol: 223 mg/dL    Back pain:  used to be intermittent but got more intense and with a lot of spasms and was seen by Dr. Mardelle Matte , had some injection, was given flexeril and took Meloxicam, feeling better now , it was on thoracic and lumbar spine but now only lumbar spine,pain is described as aching. Denies radiculitis   Patient Active Problem List   Diagnosis Date Noted   Circadian rhythm sleep disorder, shift work type 03/23/2021   Obstructive sleep apnea syndrome 03/23/2021   Attention and concentration deficit 01/20/2020   Alcohol use disorder, mild, in early remission 10/29/2019   Old tear of meniscus of right knee 03/20/2019   SOB (shortness of breath) 08/29/2017   Episodic tension-type headache, not intractable 12/20/2016   Rib pain on right side 11/27/2015   History of blood transfusion 07/30/2015   GAD (generalized anxiety disorder) 07/30/2015   Chronic constipation 07/13/2015   Insomnia 07/13/2015   MDD (major depressive disorder), recurrent episode, mild ([place]) 07/13/2015   Fibromyalgia syndrome 07/13/2015   Herpes simplex type 2 infection 07/13/2015   Morbid obesity ([place]) 07/13/2015   Hypersomnia with sleep apnea 07/13/2015   Vitamin D deficiency 07/13/2015    Past Surgical  History:  Procedure Laterality Date   BUNIONECTOMY Right 11/07/2014   Dr. Hulda Humphrey at [place] Left 2012   CESAREAN SECTION     LAPAROSCOPIC HYSTERECTOMY     followed by 3 cuff repairs   LIPOMA EXCISION  2012   REFRACTIVE SURGERY Bilateral    RIGHT/LEFT HEART CATH AND CORONARY ANGIOGRAPHY N/A 09/02/2021   Procedure: RIGHT/LEFT HEART CATH AND CORONARY ANGIOGRAPHY;  Surgeon: Jolaine Artist, MD;  Location: [facility];  Service: Cardiovascular;  Laterality: N/A;   TONSILLECTOMY      Family History  Problem Relation Age of Onset   Heart attack Father    Alcohol abuse Father    COPD Sister        end stages   Bipolar disorder Brother    Depression Brother    Bipolar disorder Brother     Depression Brother    Alcohol abuse Brother    Heart attack Paternal Uncle    Breast cancer Paternal Aunt    Breast cancer Maternal Aunt     Social History   Tobacco Use   Smoking status: Never   Smokeless tobacco: Never  Substance Use Topics   Alcohol use: Not Currently     Current Outpatient Medications:    albuterol (VENTOLIN HFA) 108 (90 Base) MCG/ACT inhaler, TAKE 2 PUFFS BY MOUTH EVERY 6 HOURS AS NEEDED FOR WHEEZE OR SHORTNESS OF BREATH, Disp: 18 each, Rfl: 1   ARIPiprazole (ABILIFY) 2 MG tablet, Take 4 mg by mouth daily., Disp: , Rfl:    baclofen (LIORESAL) 10 MG tablet, TAKE 1 TABLET BY MOUTH THREE TIMES A DAY AS NEEDED FOR MUSCLE SPASMS, Disp: 30 tablet, Rfl: 0   CAPLYTA 42 MG capsule, Take 42 mg by mouth at bedtime., Disp: , Rfl:    DULoxetine (CYMBALTA) 60 MG capsule, Take 1 capsule (60 mg total) by mouth daily. (Patient taking differently: Take 120 mg by mouth daily.), Disp: 30 capsule, Rfl: 1   furosemide (LASIX) 20 MG tablet, TAKE 1 TABLET (20 MG TOTAL) BY MOUTH AS NEEDED FOR FLUID OR EDEMA., Disp: 90 tablet, Rfl: 0   Glucos-Chond-Hyal Ac-Ca Fructo (MOVE FREE JOINT HEALTH ADVANCE PO), Take 1 tablet by mouth daily., Disp: , Rfl:    Glycerin-Hypromellose-PEG 400 (ARTIFICIAL TEARS) 0.2-0.2-1 % SOLN, Place 1 drop into both eyes daily as needed (dry eyes)., Disp: , Rfl:    hydrOXYzine (VISTARIL) 25 MG capsule, Take 25 mg by mouth 3 (three) times daily., Disp: , Rfl:    Insulin Pen Needle 32G X 6 MM MISC, 1 each by Does not apply route daily at 12 noon., Disp: 100 each, Rfl: 1   KLOR-CON M20 20 MEQ tablet, TAKE 1 TABLET (20 MEQ TOTAL) BY MOUTH DAILY AS NEEDED (TAKE WITH LASIX)., Disp: 90 tablet, Rfl: 0   LORazepam (ATIVAN) 1 MG tablet, Take 0.5-1 mg by mouth daily as needed for anxiety., Disp: , Rfl:    meloxicam (MOBIC) 15 MG tablet, Take 15 mg by mouth daily., Disp: , Rfl:    Menthol, Topical Analgesic, (BIOFREEZE) 4 % GEL, Apply 1 application topically daily as needed  (pain)., Disp: , Rfl:    modafinil (PROVIGIL) 200 MG tablet, Take 1 tablet (200 mg total) by mouth daily., Disp: 30 tablet, Rfl: 3   Multiple Vitamin (MULTIVITAMIN) tablet, Take 1 tablet by mouth daily., Disp: , Rfl:    SAXENDA 18 MG/3ML SOPN, INJECT 0.6-3 MG INTO THE SKIN DAILY., Disp: 15 mL, Rfl:  0   traZODone (DESYREL) 50 MG tablet, Take 50-150 mg by mouth at bedtime as needed., Disp: , Rfl:    triamcinolone acetonide (KENALOG-40) 40 MG/ML injection, SMARTSIG:2 Milliliter(s) IM, Disp: , Rfl:    valACYclovir (VALTREX) 500 MG tablet, TAKE 1 TABLET (500 MG TOTAL) BY MOUTH DAILY. AND TWICE DAILY FOR OUTBREAKS (Patient taking differently: Take 500 mg by mouth daily as needed (outbreaks).), Disp: 115 tablet, Rfl: 1   zinc gluconate 50 MG tablet, Take 50 mg by mouth daily., Disp: , Rfl:   No Known Allergies  I personally reviewed active problem list, medication list, allergies, family history, social history, health maintenance with the patient/caregiver today.   ROS  ***  Objective  There were no vitals filed for this visit.  There is no height or weight on file to calculate BMI.  Physical Exam ***  No results found for this or any previous visit (from the past 2160 hour(s)).   PHQ2/9:    02/21/2022    1:45 PM 02/21/2022    1:38 PM 09/27/2021    1:55 PM 08/09/2021    9:14 AM 03/24/2021    8:07 AM  Depression screen PHQ 2/9  Decreased Interest 3 0 '3 3 3  '$ Down, Depressed, Hopeless 3 0 '3 3 2  '$ PHQ - 2 Score 6 0 '6 6 5  '$ Altered sleeping 2 0 '2 2 2  '$ Tired, decreased energy 3 0 '3 3 3  '$ Change in appetite 2 0 '2 2 2  '$ Feeling bad or failure about yourself  2 0 0 1 2  Trouble concentrating 2 0 '3 2 3  '$ Moving slowly or fidgety/restless 0 0 2 1 0  Suicidal thoughts 0 0 0 0 0  PHQ-9 Score 17 0 '18 17 17  '$ Difficult doing work/chores    Very difficult     phq 9 is {gen pos [id number]}   Fall Risk:    02/21/2022    1:37 PM 11/17/2021    4:43 PM 10/18/2021    9:47 AM 09/27/2021    1:54 PM  08/09/2021    9:12 AM  Fall Risk   Falls in the past year? 0 '1 1 1 1  '$ Comment  no falls since previous visit; working on PT for fall prevention no falls since previous visit    Number falls in past yr: 0   1 1  Injury with Fall? 0   1 1  Comment    torn ligament in left wrist   Risk for fall due to : No Fall Risks   History of fall(s);Impaired balance/gait   Follow up Falls prevention discussed   Falls evaluation completed   Comment    has appointment this week to address falls       Functional Status Survey:      Assessment & Plan  *** There are no diagnoses linked to this encounter.

## 2022-06-10 ENCOUNTER — Ambulatory Visit: Payer: BC Managed Care – PPO | Admitting: Family Medicine

## 2022-06-10 ENCOUNTER — Encounter: Payer: Self-pay | Admitting: Family Medicine

## 2022-06-10 VITALS — BP 110/68 | HR 96 | Resp 16 | Ht 65.0 in | Wt 200.0 lb

## 2022-06-10 DIAGNOSIS — I5032 Chronic diastolic (congestive) heart failure: Secondary | ICD-10-CM | POA: Diagnosis not present

## 2022-06-10 DIAGNOSIS — N6311 Unspecified lump in the right breast, upper outer quadrant: Secondary | ICD-10-CM | POA: Insufficient documentation

## 2022-06-10 DIAGNOSIS — F331 Major depressive disorder, recurrent, moderate: Secondary | ICD-10-CM

## 2022-06-10 DIAGNOSIS — E559 Vitamin D deficiency, unspecified: Secondary | ICD-10-CM

## 2022-06-10 DIAGNOSIS — E785 Hyperlipidemia, unspecified: Secondary | ICD-10-CM

## 2022-06-10 DIAGNOSIS — N644 Mastodynia: Secondary | ICD-10-CM

## 2022-06-10 DIAGNOSIS — G4710 Hypersomnia, unspecified: Secondary | ICD-10-CM

## 2022-06-10 DIAGNOSIS — G4730 Sleep apnea, unspecified: Secondary | ICD-10-CM

## 2022-06-10 MED ORDER — MODAFINIL 200 MG PO TABS: 200.0000 mg | ORAL_TABLET | Freq: Every day | ORAL | 2 refills | Status: DC

## 2022-06-12 ENCOUNTER — Other Ambulatory Visit: Payer: Self-pay | Admitting: Internal Medicine

## 2022-06-22 LAB — HM MAMMOGRAPHY

## 2022-06-23 ENCOUNTER — Telehealth: Payer: Self-pay

## 2022-06-23 NOTE — Telephone Encounter (Signed)
PA initiated for Saxenda through [employer], status pending response.

## 2022-07-14 ENCOUNTER — Other Ambulatory Visit: Payer: Self-pay | Admitting: Family Medicine

## 2022-08-07 ENCOUNTER — Other Ambulatory Visit: Payer: Self-pay | Admitting: Family Medicine

## 2022-08-07 DIAGNOSIS — G4710 Hypersomnia, unspecified: Secondary | ICD-10-CM

## 2022-09-09 ENCOUNTER — Ambulatory Visit: Payer: BC Managed Care – PPO | Admitting: Family Medicine

## 2022-09-20 NOTE — Progress Notes (Deleted)
Name: Teresa Gallagher   MRN: [id number]    DOB: 1961-12-11   Date:09/20/2022       Progress Note  Subjective  Chief Complaint  Follow Up  HPI  Sleep Apnea: she was diagnosed with OSA in 2016. Reviewed the last  study with her done in 2021 , she states she had an appointment with Dr. Maxwell Caul - sleep medicine  still take modafinil to help with daytime somnolence.    SOB/CHF diastolic : seen by pulmonologist and cardiologist, wearing CPAP and using lasix and SOB has improved. Currently SOB usually during anxiety episode. She has some lower extremity edema intermittently , she has mild orthopnea . She had a cath done by Dr. Haroldine Laws and had cardiac cath 09/22 normal EG   GAD/Major  recurrent depression: she saw Dr. Shea Evans for the first time Nov 10 th, 2020. But switched to Dr. Toy Care. Since Nov 2021 She is on longer term disability since 2023. Dr. Toy Care is still adjusting her medication, she is still very tired in the mornings, but is finally able to sleep for about 6 hours per night. PHq9 is still positive    Morbid obesity  BMI is above 35 with co-morbidities such as   OSA, OA and dyslipidemia. She started on Saxenda March 2023 at a weight of 215 lbs, she is taking before supper, advised to switch to before first meal of the day. She denies side effects. She has not been as active , she thinks it causes fatigue, but explained unlikely the medication - it happened when Dr. Toy Care changed her psychiatric medication    Dyslipidemia: not on medication discussed healthy diet  The 10-year ASCVD risk score (Arnett DK, et al., 2019) is: 3.8%   Values used to calculate the score:     Age: 61 years     Sex: Female     Is Non-Hispanic African American: Yes     Diabetic: No     Tobacco smoker: No     Systolic Blood Pressure: 962 mmHg     Is BP treated: No     HDL Cholesterol: 66 mg/dL     Total Cholesterol: 223 mg/dL    Back pain: used to be intermittent but got more intense and with a lot of spasms  and was seen by Dr. Mardelle Matte , had some injection, was given flexeril and took Meloxicam, symptoms are stable. She also gets massage therapy   Right breast pain: chronic and recurrent, seen by surgeon in the past but states recently the pain is localized on right breast at 10 o'clock and very intense, she is due for mammogram   Patient Active Problem List   Diagnosis Date Noted   Chronic diastolic heart failure ([place]) 06/10/2022   Breast lump on right side at 10 o'clock position 06/10/2022   Circadian rhythm sleep disorder, shift work type 03/23/2021   Obstructive sleep apnea syndrome 03/23/2021   Attention and concentration deficit 01/20/2020   Alcohol use disorder, mild, in early remission 10/29/2019   Old tear of meniscus of right knee 03/20/2019   SOB (shortness of breath) 08/29/2017   Episodic tension-type headache, not intractable 12/20/2016   History of blood transfusion 07/30/2015   GAD (generalized anxiety disorder) 07/30/2015   Chronic constipation 07/13/2015   Insomnia 07/13/2015   MDD (major depressive disorder), recurrent episode, mild ([place]) 07/13/2015   Fibromyalgia syndrome 07/13/2015   Herpes simplex type 2 infection 07/13/2015   Morbid obesity ([place]) 07/13/2015   Sleep apnea with hypersomnolence  07/13/2015   Vitamin D deficiency 07/13/2015    Past Surgical History:  Procedure Laterality Date   BUNIONECTOMY Right 11/07/2014   Dr. Hulda Humphrey at [place] Left 2012   CESAREAN SECTION     LAPAROSCOPIC HYSTERECTOMY     followed by 3 cuff repairs   LIPOMA EXCISION  2012   REFRACTIVE SURGERY Bilateral    RIGHT/LEFT HEART CATH AND CORONARY ANGIOGRAPHY N/A 09/02/2021   Procedure: RIGHT/LEFT HEART CATH AND CORONARY ANGIOGRAPHY;  Surgeon: Jolaine Artist, MD;  Location: [facility];  Service: Cardiovascular;  Laterality: N/A;   TONSILLECTOMY      Family History  Problem Relation Age of Onset   Heart attack Father    Alcohol abuse  Father    COPD Sister        end stages   Bipolar disorder Brother    Depression Brother    Bipolar disorder Brother    Depression Brother    Alcohol abuse Brother    Heart attack Paternal Uncle    Breast cancer Paternal Aunt    Breast cancer Maternal Aunt     Social History   Tobacco Use   Smoking status: Never   Smokeless tobacco: Never  Substance Use Topics   Alcohol use: Not Currently     Current Outpatient Medications:    albuterol (VENTOLIN HFA) 108 (90 Base) MCG/ACT inhaler, TAKE 2 PUFFS BY MOUTH EVERY 6 HOURS AS NEEDED FOR WHEEZE OR SHORTNESS OF BREATH, Disp: 18 each, Rfl: 1   ARIPiprazole (ABILIFY) 2 MG tablet, Take 4 mg by mouth daily., Disp: , Rfl:    baclofen (LIORESAL) 10 MG tablet, TAKE 1 TABLET BY MOUTH THREE TIMES A DAY AS NEEDED FOR MUSCLE SPASMS, Disp: 30 tablet, Rfl: 0   CAPLYTA 42 MG capsule, Take 42 mg by mouth at bedtime., Disp: , Rfl:    DULoxetine (CYMBALTA) 60 MG capsule, Take 1 capsule (60 mg total) by mouth daily. (Patient taking differently: Take 120 mg by mouth daily.), Disp: 30 capsule, Rfl: 1   furosemide (LASIX) 20 MG tablet, TAKE 1 TABLET (20 MG TOTAL) BY MOUTH AS NEEDED FOR FLUID OR EDEMA., Disp: 90 tablet, Rfl: 0   Glucos-Chond-Hyal Ac-Ca Fructo (MOVE FREE JOINT HEALTH ADVANCE PO), Take 1 tablet by mouth daily., Disp: , Rfl:    Glycerin-Hypromellose-PEG 400 (ARTIFICIAL TEARS) 0.2-0.2-1 % SOLN, Place 1 drop into both eyes daily as needed (dry eyes)., Disp: , Rfl:    hydrOXYzine (VISTARIL) 25 MG capsule, Take 25 mg by mouth 3 (three) times daily., Disp: , Rfl:    Insulin Pen Needle 32G X 6 MM MISC, 1 each by Does not apply route daily at 12 noon., Disp: 100 each, Rfl: 1   KLOR-CON M20 20 MEQ tablet, TAKE 1 TABLET (20 MEQ TOTAL) BY MOUTH DAILY AS NEEDED (TAKE WITH LASIX)., Disp: 90 tablet, Rfl: 0   LORazepam (ATIVAN) 1 MG tablet, Take 0.5-1 mg by mouth daily as needed for anxiety., Disp: , Rfl:    meloxicam (MOBIC) 15 MG tablet, Take 15 mg by mouth  daily., Disp: , Rfl:    Menthol, Topical Analgesic, (BIOFREEZE) 4 % GEL, Apply 1 application topically daily as needed (pain)., Disp: , Rfl:    modafinil (PROVIGIL) 200 MG tablet, TAKE 1 TABLET BY MOUTH EVERY DAY, Disp: 30 tablet, Rfl: 0   Multiple Vitamin (MULTIVITAMIN) tablet, Take 1 tablet by mouth daily., Disp: , Rfl:    SAXENDA 18 MG/3ML SOPN, INJECT  0.6-3 MG INTO THE SKIN DAILY., Disp: 15 mL, Rfl: 1   traZODone (DESYREL) 50 MG tablet, Take 50-150 mg by mouth at bedtime as needed., Disp: , Rfl:    triamcinolone acetonide (KENALOG-40) 40 MG/ML injection, SMARTSIG:2 Milliliter(s) IM, Disp: , Rfl:    valACYclovir (VALTREX) 500 MG tablet, TAKE 1 TABLET (500 MG TOTAL) BY MOUTH DAILY. AND TWICE DAILY FOR OUTBREAKS (Patient taking differently: Take 500 mg by mouth daily as needed (outbreaks).), Disp: 115 tablet, Rfl: 1   zinc gluconate 50 MG tablet, Take 50 mg by mouth daily., Disp: , Rfl:   No Known Allergies  I personally reviewed active problem list, medication list, allergies, family history, social history, health maintenance with the patient/caregiver today.   ROS  ***  Objective  There were no vitals filed for this visit.  There is no height or weight on file to calculate BMI.  Physical Exam ***  No results found for this or any previous visit (from the past 2160 hour(s)).   PHQ2/9:    06/10/2022   11:29 AM 02/21/2022    1:45 PM 02/21/2022    1:38 PM 09/27/2021    1:55 PM 08/09/2021    9:14 AM  Depression screen PHQ 2/9  Decreased Interest 3 3 0 3 3  Down, Depressed, Hopeless 2 3 0 3 3  PHQ - 2 Score 5 6 0 6 6  Altered sleeping 3 2 0 2 2  Tired, decreased energy 3 3 0 3 3  Change in appetite 0 2 0 2 2  Feeling bad or failure about yourself  2 2 0 0 1  Trouble concentrating 3 2 0 3 2  Moving slowly or fidgety/restless 0 0 0 2 1  Suicidal thoughts 0 0 0 0 0  PHQ-9 Score 16 17 0 18 17  Difficult doing work/chores     Very difficult    phq 9 is {gen pos  [id number]}   Fall Risk:    06/10/2022   11:21 AM 02/21/2022    1:37 PM 11/17/2021    4:43 PM 10/18/2021    9:47 AM 09/27/2021    1:54 PM  Fall Risk   Falls in the past year? 0 0 '1 1 1  '$ Comment   no falls since previous visit; working on PT for fall prevention no falls since previous visit   Number falls in past yr: 0 0   1  Injury with Fall? 0 0   1  Comment     torn ligament in left wrist  Risk for fall due to : No Fall Risks No Fall Risks   History of fall(s);Impaired balance/gait  Follow up Falls prevention discussed Falls prevention discussed   Falls evaluation completed  Comment     has appointment this week to address falls      Functional Status Survey:      Assessment & Plan  *** There are no diagnoses linked to this encounter.

## 2022-09-21 ENCOUNTER — Ambulatory Visit: Payer: BC Managed Care – PPO | Admitting: Family Medicine

## 2022-10-01 ENCOUNTER — Other Ambulatory Visit: Payer: Self-pay | Admitting: Family Medicine

## 2022-10-05 ENCOUNTER — Ambulatory Visit: Payer: BC Managed Care – PPO | Admitting: Podiatry

## 2022-10-11 ENCOUNTER — Ambulatory Visit: Payer: BC Managed Care – PPO | Admitting: Podiatry

## 2022-10-11 DIAGNOSIS — B351 Tinea unguium: Secondary | ICD-10-CM

## 2022-10-11 DIAGNOSIS — Z79899 Other long term (current) drug therapy: Secondary | ICD-10-CM | POA: Diagnosis not present

## 2022-10-12 LAB — HEPATIC FUNCTION PANEL
ALT: 21 IU/L (ref 0–32)
AST: 17 IU/L (ref 0–40)
Albumin: 4.4 g/dL (ref 3.9–4.9)
Alkaline Phosphatase: 91 IU/L (ref 44–121)
Bilirubin Total: 0.3 mg/dL (ref 0.0–1.2)
Bilirubin, Direct: <0.1 mg/dL (ref 0.00–0.40)
Total Protein: 6.6 g/dL (ref 6.0–8.5)

## 2022-10-12 MED ORDER — TERBINAFINE HCL 250 MG PO TABS: 250.0000 mg | ORAL_TABLET | Freq: Every day | ORAL | 0 refills | Status: DC

## 2022-10-17 ENCOUNTER — Ambulatory Visit: Payer: BC Managed Care – PPO | Admitting: Family Medicine

## 2022-10-17 ENCOUNTER — Encounter: Payer: Self-pay | Admitting: Family Medicine

## 2022-10-17 DIAGNOSIS — Z1211 Encounter for screening for malignant neoplasm of colon: Secondary | ICD-10-CM

## 2022-10-17 DIAGNOSIS — F411 Generalized anxiety disorder: Secondary | ICD-10-CM

## 2022-10-17 DIAGNOSIS — G4730 Sleep apnea, unspecified: Secondary | ICD-10-CM

## 2022-10-17 DIAGNOSIS — Z23 Encounter for immunization: Secondary | ICD-10-CM

## 2022-10-17 DIAGNOSIS — E785 Hyperlipidemia, unspecified: Secondary | ICD-10-CM

## 2022-10-17 DIAGNOSIS — I5032 Chronic diastolic (congestive) heart failure: Secondary | ICD-10-CM

## 2022-10-17 DIAGNOSIS — G4710 Hypersomnia, unspecified: Secondary | ICD-10-CM

## 2022-10-17 DIAGNOSIS — F331 Major depressive disorder, recurrent, moderate: Secondary | ICD-10-CM

## 2022-10-17 DIAGNOSIS — M1711 Unilateral primary osteoarthritis, right knee: Secondary | ICD-10-CM

## 2022-10-17 DIAGNOSIS — M545 Low back pain, unspecified: Secondary | ICD-10-CM

## 2022-10-17 DIAGNOSIS — E559 Vitamin D deficiency, unspecified: Secondary | ICD-10-CM

## 2022-10-17 DIAGNOSIS — G8929 Other chronic pain: Secondary | ICD-10-CM

## 2022-10-17 MED ORDER — MODAFINIL 200 MG PO TABS: 200.0000 mg | ORAL_TABLET | Freq: Every day | ORAL | 0 refills | Status: DC

## 2022-10-17 MED ORDER — SEMAGLUTIDE-WEIGHT MANAGEMENT 1.7 MG/0.75ML ~~LOC~~ SOAJ: 1.7000 mg | SUBCUTANEOUS | 0 refills | Status: AC

## 2022-10-17 MED ORDER — SEMAGLUTIDE-WEIGHT MANAGEMENT 1 MG/0.5ML ~~LOC~~ SOAJ: 1.0000 mg | SUBCUTANEOUS | 0 refills | Status: AC

## 2022-10-17 MED ORDER — SEMAGLUTIDE-WEIGHT MANAGEMENT 0.5 MG/0.5ML ~~LOC~~ SOAJ: 0.5000 mg | SUBCUTANEOUS | 0 refills | Status: AC

## 2022-10-17 MED ORDER — SEMAGLUTIDE-WEIGHT MANAGEMENT 0.25 MG/0.5ML ~~LOC~~ SOAJ: 0.2500 mg | SUBCUTANEOUS | 0 refills | Status: AC

## 2022-10-17 MED ORDER — SEMAGLUTIDE-WEIGHT MANAGEMENT 2.4 MG/0.75ML ~~LOC~~ SOAJ: 2.4000 mg | SUBCUTANEOUS | 0 refills | Status: DC

## 2022-10-17 NOTE — Progress Notes (Signed)
Name: Teresa Gallagher   MRN: [id number]    DOB: [birth year]   Date:10/17/2022       Progress Note  Subjective  Chief Complaint  Follow up   HPI  Sleep Apnea: she was diagnosed with OSA in 2016. Reviewed the last  study with her done in 2021 , she states she had an appointment with Dr. [facility] Caul - sleep medicine  still take modafinil to help with daytime somnolence. She needs a refill today    SOB/CHF diastolic : seen by pulmonologist and cardiologist, wearing CPAP and using lasix and SOB has improved. Currently SOB usually during anxiety episode. She has some lower extremity edema intermittently , she has mild orthopnea . She had a cath done by Dr. Haroldine Laws and had cardiac cath 09/22 normal EKD    GAD/Major  recurrent depression: she saw Dr. Shea Evans for the first time Nov 10 th, 2020. But switched to Dr. Toy Care. Since Nov 2021 She is on longer term disability since 2023. Dr. Toy Care is still adjusting her medication, she is still very tired in the mornings, she has been drinking wine to sleep since all the medications makes her feel groggy, explained not safe to drink alcohol for sleep    Morbid obesity  BMI is above 35 with co-morbidities such as   OSA, OA and dyslipidemia. She started on Saxenda March 2023 at a weight of 215 lbs, she is out of medication due to [employer] , weight is down to 209.5 lbs. She is interested in trying [place], explained there is a shortage of that also    Dyslipidemia: not on medication discussed healthy diet  The 10-year ASCVD risk score (Arnett DK, et al., 2019) is: 4.4%   Values used to calculate the score:     Age: 61 years     Sex: Female     Is Non-Hispanic African American: Yes     Diabetic: No     Tobacco smoker: No     Systolic Blood Pressure: 782 mmHg     Is BP treated: No     HDL Cholesterol: 66 mg/dL     Total Cholesterol: 223 mg/dL    Back pain: used to be intermittent but got more intense and with a lot of spasms and was seen by Dr.  Mardelle Matte , had some injection, she has bacofen and meloxicam at home, pain has been worse lately  She states she was doing better when she was having regular massage therapy   Right breast pain:she had mammogram done in [place] and states she will need a biopsy, I don't have a copy of results.    Patient Active Problem List   Diagnosis Date Noted   Chronic diastolic heart failure ([place]) 06/10/2022   Breast lump on right side at 10 o'clock position 06/10/2022   Circadian rhythm sleep disorder, shift work type 03/23/2021   Obstructive sleep apnea syndrome 03/23/2021   Attention and concentration deficit 01/20/2020   Alcohol use disorder, mild, in early remission 10/29/2019   Old tear of meniscus of right knee 03/20/2019   SOB (shortness of breath) 08/29/2017   Episodic tension-type headache, not intractable 12/20/2016   History of blood transfusion 07/30/2015   GAD (generalized anxiety disorder) 07/30/2015   Chronic constipation 07/13/2015   Insomnia 07/13/2015   MDD (major depressive disorder), recurrent episode, mild ([place]) 07/13/2015   Fibromyalgia syndrome 07/13/2015   Herpes simplex type 2 infection 07/13/2015   Morbid obesity ([place]) 07/13/2015   Sleep apnea with hypersomnolence  07/13/2015   Vitamin D deficiency 07/13/2015    Past Surgical History:  Procedure Laterality Date   BUNIONECTOMY Right 11/07/2014   Dr. Hulda Humphrey at [facility] Left 2012   CESAREAN SECTION     LAPAROSCOPIC HYSTERECTOMY     followed by 3 cuff repairs   LIPOMA EXCISION  2012   REFRACTIVE SURGERY Bilateral    RIGHT/LEFT HEART CATH AND CORONARY ANGIOGRAPHY N/A 09/02/2021   Procedure: RIGHT/LEFT HEART CATH AND CORONARY ANGIOGRAPHY;  Surgeon: Jolaine Artist, MD;  Location: [facility];  Service: Cardiovascular;  Laterality: N/A;   TONSILLECTOMY      Family History  Problem Relation Age of Onset   Heart attack Father    Alcohol abuse Father    COPD Sister         end stages   Bipolar disorder Brother    Depression Brother    Bipolar disorder Brother    Depression Brother    Alcohol abuse Brother    Heart attack Paternal Uncle    Breast cancer Paternal Aunt    Breast cancer Maternal Aunt     Social History   Tobacco Use   Smoking status: Never   Smokeless tobacco: Never  Substance Use Topics   Alcohol use: Not Currently     Current Outpatient Medications:    albuterol (VENTOLIN HFA) 108 (90 Base) MCG/ACT inhaler, TAKE 2 PUFFS BY MOUTH EVERY 6 HOURS AS NEEDED FOR WHEEZE OR SHORTNESS OF BREATH, Disp: 18 each, Rfl: 1   ARIPiprazole (ABILIFY) 2 MG tablet, Take 4 mg by mouth daily., Disp: , Rfl:    baclofen (LIORESAL) 10 MG tablet, TAKE 1 TABLET BY MOUTH THREE TIMES A DAY AS NEEDED FOR MUSCLE SPASMS, Disp: 30 tablet, Rfl: 0   BD PEN NEEDLE MICRO U/F 32G X 6 MM MISC, USE AS DIRECTED DAILY AT 12 NOON, Disp: 100 each, Rfl: 1   CAPLYTA 10.5 MG CAPS, Take 1 capsule by mouth at bedtime., Disp: , Rfl:    CAPLYTA 42 MG capsule, Take 42 mg by mouth at bedtime., Disp: , Rfl:    DULoxetine (CYMBALTA) 60 MG capsule, Take 1 capsule (60 mg total) by mouth daily. (Patient taking differently: Take 120 mg by mouth daily.), Disp: 30 capsule, Rfl: 1   furosemide (LASIX) 20 MG tablet, TAKE 1 TABLET (20 MG TOTAL) BY MOUTH AS NEEDED FOR FLUID OR EDEMA., Disp: 90 tablet, Rfl: 0   Glucos-Chond-Hyal Ac-Ca Fructo (MOVE FREE JOINT HEALTH ADVANCE PO), Take 1 tablet by mouth daily., Disp: , Rfl:    Glycerin-Hypromellose-PEG 400 (ARTIFICIAL TEARS) 0.2-0.2-1 % SOLN, Place 1 drop into both eyes daily as needed (dry eyes)., Disp: , Rfl:    hydrOXYzine (VISTARIL) 25 MG capsule, Take 25 mg by mouth 3 (three) times daily., Disp: , Rfl:    KLOR-CON M20 20 MEQ tablet, TAKE 1 TABLET (20 MEQ TOTAL) BY MOUTH DAILY AS NEEDED (TAKE WITH LASIX)., Disp: 90 tablet, Rfl: 0   LORazepam (ATIVAN) 1 MG tablet, Take 0.5-1 mg by mouth daily as needed for anxiety., Disp: , Rfl:    meloxicam (MOBIC)  15 MG tablet, Take 15 mg by mouth daily., Disp: , Rfl:    Menthol, Topical Analgesic, (BIOFREEZE) 4 % GEL, Apply 1 application topically daily as needed (pain)., Disp: , Rfl:    Multiple Vitamin (MULTIVITAMIN) tablet, Take 1 tablet by mouth daily., Disp: , Rfl:    pregabalin (LYRICA) 50 MG capsule, Take 50 mg by  mouth 3 (three) times daily., Disp: , Rfl:    Semaglutide-Weight Management 0.25 MG/0.5ML SOAJ, Inject 0.25 mg into the skin once a week for 28 days., Disp: 2 mL, Rfl: 0   [START ON 11/15/2022] Semaglutide-Weight Management 0.5 MG/0.5ML SOAJ, Inject 0.5 mg into the skin once a week for 28 days., Disp: 2 mL, Rfl: 0   [START ON 12/14/2022] Semaglutide-Weight Management 1 MG/0.5ML SOAJ, Inject 1 mg into the skin once a week for 28 days., Disp: 2 mL, Rfl: 0   [START ON 01/12/2023] Semaglutide-Weight Management 1.7 MG/0.75ML SOAJ, Inject 1.7 mg into the skin once a week for 28 days., Disp: 3 mL, Rfl: 0   [START ON 02/10/2023] Semaglutide-Weight Management 2.4 MG/0.75ML SOAJ, Inject 2.4 mg into the skin once a week for 28 days., Disp: 3 mL, Rfl: 0   terbinafine (LAMISIL) 250 MG tablet, Take 1 tablet (250 mg total) by mouth daily., Disp: 90 tablet, Rfl: 0   traZODone (DESYREL) 50 MG tablet, Take 50-150 mg by mouth at bedtime as needed., Disp: , Rfl:    triamcinolone acetonide (KENALOG-40) 40 MG/ML injection, SMARTSIG:2 Milliliter(s) IM, Disp: , Rfl:    valACYclovir (VALTREX) 500 MG tablet, TAKE 1 TABLET (500 MG TOTAL) BY MOUTH DAILY. AND TWICE DAILY FOR OUTBREAKS (Patient taking differently: Take 500 mg by mouth daily as needed (outbreaks).), Disp: 115 tablet, Rfl: 1   zinc gluconate 50 MG tablet, Take 50 mg by mouth daily., Disp: , Rfl:    modafinil (PROVIGIL) 200 MG tablet, Take 1 tablet (200 mg total) by mouth daily., Disp: 90 tablet, Rfl: 0  No Known Allergies  I personally reviewed active problem list, medication list, allergies, family history, social history with the patient/caregiver  today.   ROS  Constitutional: Negative for fever , positive for weight change.  Respiratory: Negative for cough , positive for mild shortness of breath.   Cardiovascular: Negative for chest pain or palpitations.  Gastrointestinal: Negative for abdominal pain, no bowel changes.  Musculoskeletal: Negative for gait problem or joint swelling.  Skin: Negative for rash.  Neurological: Negative for dizziness or headache.  No other specific complaints in a complete review of systems (except as listed in HPI above).   Objective  Vitals:   10/17/22 1528  BP: 112/78  Pulse: 98  Resp: 16  Temp: 98.4 F (36.9 C)  TempSrc: Oral  SpO2: 96%  Weight: 209 lb 9.6 oz (95.1 kg)  Height: '5\' 5"'$  (1.651 m)    Body mass index is 34.88 kg/m.  Physical Exam  Constitutional: Patient appears well-developed and well-nourished. Obese  No distress.  HEENT: head atraumatic, normocephalic, pupils equal and reactive to light, neck supple Cardiovascular: Normal rate, regular rhythm and normal heart sounds.  No murmur heard. No BLE edema. Pulmonary/Chest: Effort normal and breath sounds normal. No respiratory distress. Abdominal: Soft.  There is no tenderness. Psychiatric: Patient has a normal mood and affect. behavior is normal. Judgment and thought content normal.   Recent Results (from the past 2160 hour(s))  Hepatic Function Panel     Status: None   Collection Time: 10/11/22  2:49 PM  Result Value Ref Range   Total Protein 6.6 6.0 - 8.5 g/dL   Albumin 4.4 3.9 - 4.9 g/dL   Bilirubin Total 0.3 0.0 - 1.2 mg/dL   Bilirubin, Direct <0.10 0.00 - 0.40 mg/dL   Alkaline Phosphatase 91 44 - 121 IU/L   AST 17 0 - 40 IU/L   ALT 21 0 - 32 IU/L  PHQ2/9:    10/17/2022    3:38 PM 06/10/2022   11:29 AM 02/21/2022    1:45 PM 02/21/2022    1:38 PM 09/27/2021    1:55 PM  Depression screen PHQ 2/9  Decreased Interest '3 3 3 '$ 0 3  Down, Depressed, Hopeless '3 2 3 '$ 0 3  PHQ - 2 Score '6 5 6 '$ 0 6  Altered sleeping  '3 3 2 '$ 0 2  Tired, decreased energy '3 3 3 '$ 0 3  Change in appetite 3 0 2 0 2  Feeling bad or failure about yourself  '2 2 2 '$ 0 0  Trouble concentrating '3 3 2 '$ 0 3  Moving slowly or fidgety/restless 2 0 0 0 2  Suicidal thoughts 0 0 0 0 0  PHQ-9 Score '22 16 17 '$ 0 18  Difficult doing work/chores Extremely dIfficult        phq 9 is positive   Fall Risk:    10/17/2022    3:30 PM 06/10/2022   11:21 AM 02/21/2022    1:37 PM 11/17/2021    4:43 PM 10/18/2021    9:47 AM  Fall Risk   Falls in the past year? 1 0 0 1 1  Comment    no falls since previous visit; working on PT for fall prevention no falls since previous visit  Number falls in past yr: 1 0 0    Injury with Fall? 1 0 0    Risk for fall due to : History of fall(s) No Fall Risks No Fall Risks    Follow up Falls prevention discussed;Education provided;Falls evaluation completed Falls prevention discussed Falls prevention discussed       Functional Status Survey: Is the patient deaf or have difficulty hearing?: No Does the patient have difficulty seeing, even when wearing glasses/contacts?: No Does the patient have difficulty concentrating, remembering, or making decisions?: Yes Does the patient have difficulty walking or climbing stairs?: No Does the patient have difficulty dressing or bathing?: No Does the patient have difficulty doing errands alone such as visiting a doctor's office or shopping?: No    Assessment & Plan  1. Morbid obesity ([place])  - Semaglutide-Weight Management 0.25 MG/0.5ML SOAJ; Inject 0.25 mg into the skin once a week for 28 days.  Dispense: 2 mL; Refill: 0 - Semaglutide-Weight Management 0.5 MG/0.5ML SOAJ; Inject 0.5 mg into the skin once a week for 28 days.  Dispense: 2 mL; Refill: 0 - Semaglutide-Weight Management 1 MG/0.5ML SOAJ; Inject 1 mg into the skin once a week for 28 days.  Dispense: 2 mL; Refill: 0 - Semaglutide-Weight Management 1.7 MG/0.75ML SOAJ; Inject 1.7 mg into the skin once a week for 28  days.  Dispense: 3 mL; Refill: 0 - Semaglutide-Weight Management 2.4 MG/0.75ML SOAJ; Inject 2.4 mg into the skin once a week for 28 days.  Dispense: 3 mL; Refill: 0  2. Chronic diastolic heart failure ([place])  Keep follow up with cardiologist   3. Moderate episode of recurrent major depressive disorder [facility])  Not doing well, needs to keep follow up with psychiatrist   4. GAD (generalized anxiety disorder)   5. Sleep apnea with hypersomnolence  - modafinil (PROVIGIL) 200 MG tablet; Take 1 tablet (200 mg total) by mouth daily.  Dispense: 90 tablet; Refill: 0  6. Dyslipidemia  ASCVD is still low, not on medication  7. Colon cancer screening  - Ambulatory referral to Gastroenterology  8. Need for immunization against influenza  - Flu Vaccine QUAD 6+ mos PF IM (Fluarix  Quad PF)  9. Vitamin D deficiency  Continue vitamin D supplementation   10. Primary osteoarthritis of right knee   11. Chronic bilateral low back pain without sciatica  Continue prn medication

## 2022-10-18 ENCOUNTER — Other Ambulatory Visit: Payer: Self-pay

## 2022-10-18 ENCOUNTER — Telehealth: Payer: Self-pay

## 2022-10-18 DIAGNOSIS — Z1211 Encounter for screening for malignant neoplasm of colon: Secondary | ICD-10-CM

## 2022-10-18 MED ORDER — NA SULFATE-K SULFATE-MG SULF 17.5-3.13-1.6 GM/177ML PO SOLN: 1.0000 | Freq: Once | ORAL | 0 refills | Status: AC

## 2022-10-18 NOTE — Telephone Encounter (Signed)
Gastroenterology Pre-Procedure Review  Request Date: 11/08/22 Requesting Physician: Dr. Marius Ditch  PATIENT REVIEW QUESTIONS: The patient responded to the following health history questions as indicated:    1. Are you having any GI issues? no 2. Do you have a personal history of Polyps? no 3. Do you have a family history of Colon Cancer or Polyps?  Unsure dad experienced rectal bleeding but it was never discussed 4. Diabetes Mellitus? no 5. Joint replacements in the past 12 months?no 6. Major health problems in the past 3 months?no 7. Any artificial heart valves, MVP, or defibrillator?no    MEDICATIONS & ALLERGIES:    Patient reports the following regarding taking any anticoagulation/antiplatelet therapy:   Plavix, Coumadin, Eliquis, Xarelto, Lovenox, Pradaxa, Brilinta, or Effient? no Aspirin? no  Patient confirms/reports the following medications:  Current Outpatient Medications  Medication Sig Dispense Refill   albuterol (VENTOLIN HFA) 108 (90 Base) MCG/ACT inhaler TAKE 2 PUFFS BY MOUTH EVERY 6 HOURS AS NEEDED FOR WHEEZE OR SHORTNESS OF BREATH 18 each 1   ARIPiprazole (ABILIFY) 2 MG tablet Take 4 mg by mouth daily.     baclofen (LIORESAL) 10 MG tablet TAKE 1 TABLET BY MOUTH THREE TIMES A DAY AS NEEDED FOR MUSCLE SPASMS 30 tablet 0   BD PEN NEEDLE MICRO U/F 32G X 6 MM MISC USE AS DIRECTED DAILY AT 12 NOON 100 each 1   CAPLYTA 10.5 MG CAPS Take 1 capsule by mouth at bedtime.     CAPLYTA 42 MG capsule Take 42 mg by mouth at bedtime.     DULoxetine (CYMBALTA) 60 MG capsule Take 1 capsule (60 mg total) by mouth daily. (Patient taking differently: Take 120 mg by mouth daily.) 30 capsule 1   furosemide (LASIX) 20 MG tablet TAKE 1 TABLET (20 MG TOTAL) BY MOUTH AS NEEDED FOR FLUID OR EDEMA. 90 tablet 0   Glucos-Chond-Hyal Ac-Ca Fructo (MOVE FREE JOINT HEALTH ADVANCE PO) Take 1 tablet by mouth daily.     Glycerin-Hypromellose-PEG 400 (ARTIFICIAL TEARS) 0.2-0.2-1 % SOLN Place 1 drop into both eyes  daily as needed (dry eyes).     hydrOXYzine (VISTARIL) 25 MG capsule Take 25 mg by mouth 3 (three) times daily.     KLOR-CON M20 20 MEQ tablet TAKE 1 TABLET (20 MEQ TOTAL) BY MOUTH DAILY AS NEEDED (TAKE WITH LASIX). 90 tablet 0   LORazepam (ATIVAN) 1 MG tablet Take 0.5-1 mg by mouth daily as needed for anxiety.     meloxicam (MOBIC) 15 MG tablet Take 15 mg by mouth daily.     Menthol, Topical Analgesic, (BIOFREEZE) 4 % GEL Apply 1 application topically daily as needed (pain).     modafinil (PROVIGIL) 200 MG tablet Take 1 tablet (200 mg total) by mouth daily. 90 tablet 0   Multiple Vitamin (MULTIVITAMIN) tablet Take 1 tablet by mouth daily.     pregabalin (LYRICA) 50 MG capsule Take 50 mg by mouth 3 (three) times daily.     Semaglutide-Weight Management 0.25 MG/0.5ML SOAJ Inject 0.25 mg into the skin once a week for 28 days. 2 mL 0   [START ON 11/15/2022] Semaglutide-Weight Management 0.5 MG/0.5ML SOAJ Inject 0.5 mg into the skin once a week for 28 days. 2 mL 0   [START ON 12/14/2022] Semaglutide-Weight Management 1 MG/0.5ML SOAJ Inject 1 mg into the skin once a week for 28 days. 2 mL 0   [START ON 01/12/2023] Semaglutide-Weight Management 1.7 MG/0.75ML SOAJ Inject 1.7 mg into the skin once a week for 28 days.  3 mL 0   [START ON 02/10/2023] Semaglutide-Weight Management 2.4 MG/0.75ML SOAJ Inject 2.4 mg into the skin once a week for 28 days. 3 mL 0   terbinafine (LAMISIL) 250 MG tablet Take 1 tablet (250 mg total) by mouth daily. 90 tablet 0   traZODone (DESYREL) 50 MG tablet Take 50-150 mg by mouth at bedtime as needed.     triamcinolone acetonide (KENALOG-40) 40 MG/ML injection SMARTSIG:2 Milliliter(s) IM     valACYclovir (VALTREX) 500 MG tablet TAKE 1 TABLET (500 MG TOTAL) BY MOUTH DAILY. AND TWICE DAILY FOR OUTBREAKS (Patient taking differently: Take 500 mg by mouth daily as needed (outbreaks).) 115 tablet 1   zinc gluconate 50 MG tablet Take 50 mg by mouth daily.     No current  facility-administered medications for this visit.    Patient confirms/reports the following allergies:  No Known Allergies  No orders of the defined types were placed in this encounter.   AUTHORIZATION INFORMATION Primary Insurance: 1D#: Group #:  Secondary Insurance: 1D#: Group #:  SCHEDULE INFORMATION: Date: 11/08/22 Time: Location: [place]

## 2022-10-18 NOTE — Progress Notes (Signed)
Subjective:  Patient ID: Teresa Gallagher, female    DOB: [birth year],  MRN: [id number]  Chief Complaint  Patient presents with   Nail Problem    61 y.o. female presents with the above complaint.  Patient presents with complaint bilateral hallux onychomycosis with thickened elongated dystrophic toenails x2.  She would like to discuss treatment options for this she has not seen anyone else prior to seeing me.  She has tried some over-the-counter medication none of which has helped.  She would like to discuss oral medication.  Does not cause her pain.   Review of Systems: Negative except as noted in the HPI. Denies N/V/F/Ch.  Past Medical History:  Diagnosis Date   Acne    Arrhythmia    Baker's cyst, right 10/2018   calf   Blood transfusion without reported diagnosis    Bunion    Cervicalgia    Chronic kidney disease    Kidney Stents   Depression    Herpes simplex without mention of complication    Mastodynia    Myalgia and myositis, unspecified    Other acne    Other malaise and fatigue    Painful respiration    Palpitations    Seborrhea capitis    Sleep apnea in adult    Symptomatic menopausal or female climacteric states    Unspecified constipation    Unspecified sleep apnea    Unspecified vitamin D deficiency    Vitamin D deficiency     Current Outpatient Medications:    terbinafine (LAMISIL) 250 MG tablet, Take 1 tablet (250 mg total) by mouth daily., Disp: 90 tablet, Rfl: 0   albuterol (VENTOLIN HFA) 108 (90 Base) MCG/ACT inhaler, TAKE 2 PUFFS BY MOUTH EVERY 6 HOURS AS NEEDED FOR WHEEZE OR SHORTNESS OF BREATH, Disp: 18 each, Rfl: 1   ARIPiprazole (ABILIFY) 2 MG tablet, Take 4 mg by mouth daily., Disp: , Rfl:    baclofen (LIORESAL) 10 MG tablet, TAKE 1 TABLET BY MOUTH THREE TIMES A DAY AS NEEDED FOR MUSCLE SPASMS, Disp: 30 tablet, Rfl: 0   BD PEN NEEDLE MICRO U/F 32G X 6 MM MISC, USE AS DIRECTED DAILY AT 12 NOON, Disp: 100 each, Rfl: 1   CAPLYTA 10.5 MG CAPS, Take 1  capsule by mouth at bedtime., Disp: , Rfl:    CAPLYTA 42 MG capsule, Take 42 mg by mouth at bedtime., Disp: , Rfl:    DULoxetine (CYMBALTA) 60 MG capsule, Take 1 capsule (60 mg total) by mouth daily. (Patient taking differently: Take 120 mg by mouth daily.), Disp: 30 capsule, Rfl: 1   furosemide (LASIX) 20 MG tablet, TAKE 1 TABLET (20 MG TOTAL) BY MOUTH AS NEEDED FOR FLUID OR EDEMA., Disp: 90 tablet, Rfl: 0   Glucos-Chond-Hyal Ac-Ca Fructo (MOVE FREE JOINT HEALTH ADVANCE PO), Take 1 tablet by mouth daily., Disp: , Rfl:    Glycerin-Hypromellose-PEG 400 (ARTIFICIAL TEARS) 0.2-0.2-1 % SOLN, Place 1 drop into both eyes daily as needed (dry eyes)., Disp: , Rfl:    hydrOXYzine (VISTARIL) 25 MG capsule, Take 25 mg by mouth 3 (three) times daily., Disp: , Rfl:    KLOR-CON M20 20 MEQ tablet, TAKE 1 TABLET (20 MEQ TOTAL) BY MOUTH DAILY AS NEEDED (TAKE WITH LASIX)., Disp: 90 tablet, Rfl: 0   LORazepam (ATIVAN) 1 MG tablet, Take 0.5-1 mg by mouth daily as needed for anxiety., Disp: , Rfl:    meloxicam (MOBIC) 15 MG tablet, Take 15 mg by mouth daily., Disp: , Rfl:  Menthol, Topical Analgesic, (BIOFREEZE) 4 % GEL, Apply 1 application topically daily as needed (pain)., Disp: , Rfl:    modafinil (PROVIGIL) 200 MG tablet, Take 1 tablet (200 mg total) by mouth daily., Disp: 90 tablet, Rfl: 0   Multiple Vitamin (MULTIVITAMIN) tablet, Take 1 tablet by mouth daily., Disp: , Rfl:    Na Sulfate-K Sulfate-Mg Sulf 17.5-3.13-1.6 GM/177ML SOLN, Take 1 kit by mouth once for 1 dose., Disp: 354 mL, Rfl: 0   pregabalin (LYRICA) 50 MG capsule, Take 50 mg by mouth 3 (three) times daily., Disp: , Rfl:    Semaglutide-Weight Management 0.25 MG/0.5ML SOAJ, Inject 0.25 mg into the skin once a week for 28 days., Disp: 2 mL, Rfl: 0   [START ON 11/15/2022] Semaglutide-Weight Management 0.5 MG/0.5ML SOAJ, Inject 0.5 mg into the skin once a week for 28 days., Disp: 2 mL, Rfl: 0   [START ON 12/14/2022] Semaglutide-Weight Management 1  MG/0.5ML SOAJ, Inject 1 mg into the skin once a week for 28 days., Disp: 2 mL, Rfl: 0   [START ON 01/12/2023] Semaglutide-Weight Management 1.7 MG/0.75ML SOAJ, Inject 1.7 mg into the skin once a week for 28 days., Disp: 3 mL, Rfl: 0   [START ON 02/10/2023] Semaglutide-Weight Management 2.4 MG/0.75ML SOAJ, Inject 2.4 mg into the skin once a week for 28 days., Disp: 3 mL, Rfl: 0   traZODone (DESYREL) 50 MG tablet, Take 50-150 mg by mouth at bedtime as needed., Disp: , Rfl:    triamcinolone acetonide (KENALOG-40) 40 MG/ML injection, SMARTSIG:2 Milliliter(s) IM, Disp: , Rfl:    valACYclovir (VALTREX) 500 MG tablet, TAKE 1 TABLET (500 MG TOTAL) BY MOUTH DAILY. AND TWICE DAILY FOR OUTBREAKS (Patient taking differently: Take 500 mg by mouth daily as needed (outbreaks).), Disp: 115 tablet, Rfl: 1   zinc gluconate 50 MG tablet, Take 50 mg by mouth daily., Disp: , Rfl:   Social History   Tobacco Use  Smoking Status Never  Smokeless Tobacco Never    No Known Allergies Objective:  There were no vitals filed for this visit. There is no height or weight on file to calculate BMI. Constitutional Well developed. Well nourished.  Vascular Dorsalis pedis pulses palpable bilaterally. Posterior tibial pulses palpable bilaterally. Capillary refill normal to all digits.  No cyanosis or clubbing noted. Pedal hair growth normal.  Neurologic Normal speech. Oriented to person, place, and time. Epicritic sensation to light touch grossly present bilaterally.  Dermatologic Nails thickened elongated dystrophic mycotic toenails x2 bilateral hallux Skin within normal limits  Orthopedic: Normal joint ROM without pain or crepitus bilaterally. No visible deformities. No bony tenderness.   Radiographs: None Assessment:   1. Long-term use of high-risk medication   2. Nail fungus   3. Onychomycosis due to dermatophyte    Plan:  Patient was evaluated and treated and all questions answered.  Bilateral hallux  onychomycosis -Educated the patient on the etiology of onychomycosis and various treatment options associated with improving the fungal load.  I explained to the patient that there is 3 treatment options available to treat the onychomycosis including topical, p.o., laser treatment.  Patient elected to undergo p.o. options with Lamisil/terbinafine therapy.  In order for me to start the medication therapy, I explained to the patient the importance of evaluating the liver and obtaining the liver function test.  Once the liver function test comes back normal I will start him on 22-monthcourse of Lamisil therapy.  Patient understood all risk and would like to proceed with Lamisil therapy.  I have asked the patient to immediately stop the Lamisil therapy if she has any reactions to it and call the office or go to the emergency room right away.  Patient states understanding   No follow-ups on file.

## 2022-11-07 ENCOUNTER — Encounter: Payer: Self-pay | Admitting: Gastroenterology

## 2022-11-08 ENCOUNTER — Encounter: Payer: Self-pay | Admitting: Gastroenterology

## 2022-11-08 ENCOUNTER — Ambulatory Visit: Payer: BC Managed Care – PPO | Admitting: Certified Registered"

## 2022-11-08 ENCOUNTER — Encounter
Admission: RE | Disposition: A | Discharge status: Home / Self Care | Payer: Self-pay | Source: Home / Self Care | Attending: Gastroenterology

## 2022-11-08 ENCOUNTER — Ambulatory Visit
Admission: RE | Admit: 2022-11-08 | Discharge: 2022-11-08 | Disposition: A | Discharge status: Home / Self Care | Payer: BC Managed Care – PPO | Attending: Gastroenterology | Admitting: Gastroenterology

## 2022-11-08 DIAGNOSIS — G4730 Sleep apnea, unspecified: Secondary | ICD-10-CM | POA: Insufficient documentation

## 2022-11-08 DIAGNOSIS — D128 Benign neoplasm of rectum: Secondary | ICD-10-CM | POA: Insufficient documentation

## 2022-11-08 DIAGNOSIS — K621 Rectal polyp: Secondary | ICD-10-CM | POA: Diagnosis not present

## 2022-11-08 DIAGNOSIS — K644 Residual hemorrhoidal skin tags: Secondary | ICD-10-CM | POA: Diagnosis not present

## 2022-11-08 DIAGNOSIS — Z1211 Encounter for screening for malignant neoplasm of colon: Secondary | ICD-10-CM | POA: Diagnosis present

## 2022-11-08 DIAGNOSIS — Z6834 Body mass index (BMI) 34.0-34.9, adult: Secondary | ICD-10-CM | POA: Diagnosis not present

## 2022-11-08 DIAGNOSIS — F419 Anxiety disorder, unspecified: Secondary | ICD-10-CM | POA: Diagnosis not present

## 2022-11-08 DIAGNOSIS — K5730 Diverticulosis of large intestine without perforation or abscess without bleeding: Secondary | ICD-10-CM | POA: Insufficient documentation

## 2022-11-08 DIAGNOSIS — D124 Benign neoplasm of descending colon: Secondary | ICD-10-CM | POA: Diagnosis not present

## 2022-11-08 HISTORY — PX: COLONOSCOPY WITH PROPOFOL: SHX5780

## 2022-11-08 SURGERY — COLONOSCOPY WITH PROPOFOL
Anesthesia: General

## 2022-11-08 MED ORDER — PROPOFOL 500 MG/50ML IV EMUL
INTRAVENOUS | Status: DC | PRN
  Administered 2022-11-08: 120 ug/kg/min via INTRAVENOUS

## 2022-11-08 MED ORDER — LIDOCAINE HCL (CARDIAC) PF 100 MG/5ML IV SOSY
PREFILLED_SYRINGE | INTRAVENOUS | Status: DC | PRN
  Administered 2022-11-08: 50 mg via INTRAVENOUS

## 2022-11-08 MED ORDER — PROPOFOL 10 MG/ML IV BOLUS
INTRAVENOUS | Status: DC | PRN
  Administered 2022-11-08: 80 mg via INTRAVENOUS

## 2022-11-08 MED ORDER — SODIUM CHLORIDE 0.9 % IV SOLN: INTRAVENOUS | Status: DC

## 2022-11-08 NOTE — H&P (Signed)
Cephas Darby, MD [address]  [place]  [place], [place] [address]  Main: [phone]  Fax: [phone] Pager: [phone]  Primary Care Physician:  Steele Sizer, MD Primary Gastroenterologist:  Dr. Cephas Darby  Pre-Procedure History & Physical: HPI:  Teresa Gallagher is a 61 y.o. female is here for an colonoscopy.   Past Medical History:  Diagnosis Date   Acne    Arrhythmia    Baker's cyst, right 10/2018   calf   Blood transfusion without reported diagnosis    Bunion    Cervicalgia    Chronic kidney disease    Kidney Stents   Depression    Herpes simplex without mention of complication    Mastodynia    Myalgia and myositis, unspecified    Other acne    Other malaise and fatigue    Painful respiration    Palpitations    Seborrhea capitis    Sleep apnea in adult    Symptomatic menopausal or female climacteric states    Unspecified constipation    Unspecified sleep apnea    Unspecified vitamin D deficiency    Vitamin D deficiency     Past Surgical History:  Procedure Laterality Date   ABDOMINAL HYSTERECTOMY     BUNIONECTOMY Right 11/07/2014   Dr. Hulda Humphrey at [place] Left 12/19/2010   CESAREAN SECTION     LAPAROSCOPIC HYSTERECTOMY     followed by 3 cuff repairs   LIPOMA EXCISION  12/19/2010   REFRACTIVE SURGERY Bilateral    RIGHT/LEFT HEART CATH AND CORONARY ANGIOGRAPHY N/A 09/02/2021   Procedure: RIGHT/LEFT HEART CATH AND CORONARY ANGIOGRAPHY;  Surgeon: Jolaine Artist, MD;  Location: [facility];  Service: Cardiovascular;  Laterality: N/A;   TONSILLECTOMY      Prior to Admission medications   Medication Sig Start Date End Date Taking? Authorizing Provider  modafinil (PROVIGIL) 200 MG tablet Take 1 tablet (200 mg total) by mouth daily. 10/17/22  Yes Sowles, Drue Stager, MD  Multiple Vitamin (MULTIVITAMIN) tablet Take 1 tablet by mouth daily.   Yes [provider]   albuterol (VENTOLIN HFA) 108 (90 Base) MCG/ACT inhaler TAKE 2 PUFFS BY MOUTH EVERY 6 HOURS AS NEEDED FOR WHEEZE OR SHORTNESS OF BREATH 10/24/20   Ancil Boozer, Drue Stager, MD  ARIPiprazole (ABILIFY) 2 MG tablet Take 4 mg by mouth daily. 11/18/18   Chucky May, MD  baclofen (LIORESAL) 10 MG tablet TAKE 1 TABLET BY MOUTH THREE TIMES A DAY AS NEEDED FOR MUSCLE SPASMS 10/31/21   Steele Sizer, MD  BD PEN NEEDLE MICRO U/F 32G X 6 MM MISC USE AS DIRECTED DAILY AT 12 NOON 10/03/22   Sowles, Drue Stager, MD  CAPLYTA 10.5 MG CAPS Take 1 capsule by mouth at bedtime. 08/20/22   [provider]  CAPLYTA 42 MG capsule Take 42 mg by mouth at bedtime. 11/25/21   Chucky May, MD  DULoxetine (CYMBALTA) 60 MG capsule Take 1 capsule (60 mg total) by mouth daily. Patient taking differently: Take 120 mg by mouth daily. 11/21/19   Ursula Alert, MD  furosemide (LASIX) 20 MG tablet TAKE 1 TABLET (20 MG TOTAL) BY MOUTH AS NEEDED FOR FLUID OR EDEMA. 09/27/21   Bo Merino, FNP  Glucos-Chond-Hyal Ac-Ca Fructo (MOVE FREE JOINT HEALTH ADVANCE PO) Take 1 tablet by mouth daily.    [provider]  Glycerin-Hypromellose-PEG 400 (ARTIFICIAL TEARS) 0.2-0.2-1 % SOLN Place 1 drop into both eyes  daily as needed (dry eyes).    [provider]  hydrOXYzine (VISTARIL) 25 MG capsule Take 25 mg by mouth 3 (three) times daily. 05/24/22   Chucky May, MD  KLOR-CON M20 20 MEQ tablet TAKE 1 TABLET (20 MEQ TOTAL) BY MOUTH DAILY AS NEEDED (TAKE WITH LASIX). 06/11/19   Steele Sizer, MD  LORazepam (ATIVAN) 1 MG tablet Take 0.5-1 mg by mouth daily as needed for anxiety. 06/28/21   Chucky May, MD  meloxicam (MOBIC) 15 MG tablet Take 15 mg by mouth daily. 12/19/21   [provider]  Menthol, Topical Analgesic, (BIOFREEZE) 4 % GEL Apply 1 application topically daily as needed (pain).    [provider]  pregabalin (LYRICA) 50 MG capsule Take 50 mg by mouth 3 (three) times daily. 10/12/22   Chucky May,  MD  Semaglutide-Weight Management 0.25 MG/0.5ML SOAJ Inject 0.25 mg into the skin once a week for 28 days. 10/17/22 11/14/22  Steele Sizer, MD  Semaglutide-Weight Management 0.5 MG/0.5ML SOAJ Inject 0.5 mg into the skin once a week for 28 days. 11/15/22 12/13/22  Steele Sizer, MD  Semaglutide-Weight Management 1 MG/0.5ML SOAJ Inject 1 mg into the skin once a week for 28 days. 12/14/22 01/11/23  Steele Sizer, MD  Semaglutide-Weight Management 1.7 MG/0.75ML SOAJ Inject 1.7 mg into the skin once a week for 28 days. 01/12/23 02/09/23  Steele Sizer, MD  Semaglutide-Weight Management 2.4 MG/0.75ML SOAJ Inject 2.4 mg into the skin once a week for 28 days. 02/10/23 03/10/23  Steele Sizer, MD  terbinafine (LAMISIL) 250 MG tablet Take 1 tablet (250 mg total) by mouth daily. 10/12/22   Felipa Furnace, DPM  traZODone (DESYREL) 50 MG tablet Take 50-150 mg by mouth at bedtime as needed. 05/23/22   Chucky May, MD  triamcinolone acetonide [facility]) 40 MG/ML injection SMARTSIG:2 Milliliter(s) IM 12/27/21   [provider]  valACYclovir (VALTREX) 500 MG tablet TAKE 1 TABLET (500 MG TOTAL) BY MOUTH DAILY. AND TWICE DAILY FOR OUTBREAKS Patient taking differently: Take 500 mg by mouth daily as needed (outbreaks). 11/21/20   Steele Sizer, MD  zinc gluconate 50 MG tablet Take 50 mg by mouth daily.    [provider]    Allergies as of 10/18/2022   (No Known Allergies)    Family History  Problem Relation Age of Onset   Heart attack Father    Alcohol abuse Father    COPD Sister        end stages   Bipolar disorder Brother    Depression Brother    Bipolar disorder Brother    Depression Brother    Alcohol abuse Brother    Heart attack Paternal Uncle    Breast cancer Paternal Aunt    Breast cancer Maternal Aunt     Social History   Socioeconomic History   Marital status: Divorced    Spouse name: larry   Number of children: 3   Years of education: Not on file   Highest  education level: Bachelor's degree (e.g., BA, AB, BS)  Occupational History   Not on file  Tobacco Use   Smoking status: Never   Smokeless tobacco: Never  Vaping Use   Vaping Use: Never used  Substance and Sexual Activity   Alcohol use: Not Currently   Drug use: No   Sexual activity: Not Currently  Other Topics Concern   Not on file  Social History Narrative   She has been on disability since end of 2021   She has  a grown son that has Down's syndrome that lives with her       Social Determinants of Health   Financial Resource Strain: Low Risk  (08/09/2021)   Overall Financial Resource Strain (CARDIA)    Difficulty of Paying Living Expenses: Not hard at all  Food Insecurity: No Food Insecurity (08/09/2021)   Hunger Vital Sign    Worried About Running Out of Food in the Last Year: Never true    [place] in the Last Year: Never true  Transportation Needs: No Transportation Needs (08/09/2021)   PRAPARE - Hydrologist (Medical): No    Lack of Transportation (Non-Medical): No  Physical Activity: Inactive (08/09/2021)   Exercise Vital Sign    Days of Exercise per Week: 0 days    Minutes of Exercise per Session: 0 min  Stress: Stress Concern Present (08/09/2021)   [place]    Feeling of Stress : Very much  Social Connections: Socially Isolated (08/09/2021)   Social Connection and Isolation Panel [NHANES]    Frequency of Communication with Friends and Family: Twice a week    Frequency of Social Gatherings with Friends and Family: Once a week    Attends Religious Services: Never    Marine scientist or Organizations: No    Attends Music therapist: Not on file    Marital Status: Divorced  Intimate Partner Violence: Not At Risk (08/09/2021)   Humiliation, Afraid, Rape, and Kick questionnaire    Fear of Current or Ex-Partner: No    Emotionally Abused: No     Physically Abused: No    Sexually Abused: No    Review of Systems: See HPI, otherwise negative ROS  Physical Exam: BP 111/84   Pulse 97   Temp 97.6 F (36.4 C) (Temporal)   Resp 16   Ht '5\' 5"'$  (1.651 m)   Wt 95.3 kg   SpO2 98%   BMI 34.95 kg/m  General:   Alert,  pleasant and cooperative in NAD Head:  Normocephalic and atraumatic. Neck:  Supple; no masses or thyromegaly. Lungs:  Clear throughout to auscultation.    Heart:  Regular rate and rhythm. Abdomen:  Soft, nontender and nondistended. Normal bowel sounds, without guarding, and without rebound.   Neurologic:  Alert and  oriented x4;  grossly normal neurologically.  Impression/Plan: Teresa Gallagher is here for an colonoscopy to be performed for colon cancer screening  Risks, benefits, limitations, and alternatives regarding  colonoscopy have been reviewed with the patient.  Questions have been answered.  All parties agreeable.   Sherri Sear, MD  11/08/2022, 10:12 AM

## 2022-11-08 NOTE — Op Note (Signed)
[facility] Patient Name: Leslieann Whisman Procedure Date: 11/08/2022 9:57 AM MRN: [id number] Account #: [id number] Date of Birth: 1961/09/08 Admit Type: Outpatient Age: 61 Room: [facility] ENDO ROOM 3 Gender: Female Note Status: Finalized Instrument Name: Colonoscope [id number] Procedure:             Colonoscopy Indications:           Screening for colorectal malignant neoplasm, Last                         colonoscopy: April 2013, Last colonoscopy 10 years ago Providers:             Lin Landsman MD, MD Referring MD:          Bethena Roys. Sowles, MD (Referring MD) Medicines:             General Anesthesia Complications:         No immediate complications. Estimated blood loss: None. Procedure:             Pre-Anesthesia Assessment:                        - Prior to the procedure, a History and Physical was                         performed, and patient medications and allergies were                         reviewed. The patient is competent. The risks and                         benefits of the procedure and the sedation options and                         risks were discussed with the patient. All questions                         were answered and informed consent was obtained.                         Patient identification and proposed procedure were                         verified by the physician, the nurse, the                         anesthesiologist, the anesthetist and the technician                         in the pre-procedure area in the procedure room in the                         endoscopy suite. Mental Status Examination: alert and                         oriented. Airway Examination: normal oropharyngeal                         airway and neck mobility. Respiratory Examination:  clear to auscultation. CV Examination: normal.                         Prophylactic Antibiotics: The patient does not require                          prophylactic antibiotics. Prior Anticoagulants: The                         patient has taken no anticoagulant or antiplatelet                         agents. ASA Grade Assessment: III - A patient with                         severe systemic disease. After reviewing the risks and                         benefits, the patient was deemed in satisfactory                         condition to undergo the procedure. The anesthesia                         plan was to use general anesthesia. Immediately prior                         to administration of medications, the patient was                         re-assessed for adequacy to receive sedatives. The                         heart rate, respiratory rate, oxygen saturations,                         blood pressure, adequacy of pulmonary ventilation, and                         response to care were monitored throughout the                         procedure. The physical status of the patient was                         re-assessed after the procedure.                        After obtaining informed consent, the colonoscope was                         passed under direct vision. Throughout the procedure,                         the patient's blood pressure, pulse, and oxygen                         saturations were monitored continuously. The  Colonoscope was introduced through the anus and                         advanced to the the terminal ileum, with                         identification of the appendiceal orifice and IC                         valve. The colonoscopy was performed without                         difficulty. The patient tolerated the procedure well.                         The quality of the bowel preparation was evaluated                         using the BBPS [facility] Bowel Preparation Scale) with                         scores of: Right Colon = 3, Transverse Colon = 3 and                          Left Colon = 3 (entire mucosa seen well with no                         residual staining, small fragments of stool or opaque                         liquid). The total BBPS score equals 9. The terminal                         ileum, ileocecal valve, appendiceal orifice, and                         rectum were photographed. Findings:      The perianal and digital rectal examinations were normal. Pertinent       negatives include normal sphincter tone and no palpable rectal lesions.      The terminal ileum appeared normal.      Two sessile polyps were found in the rectum and descending colon. The       polyps were 5 to 6 mm in size. These polyps were removed with a cold       snare. Resection and retrieval were complete. Estimated blood loss: none.      Multiple diverticula were found in the recto-sigmoid colon and sigmoid       colon.      Non-bleeding external hemorrhoids were found during retroflexion. The       hemorrhoids were medium-sized. Impression:            - The examined portion of the ileum was normal.                        - Two 5 to 6 mm polyps in the rectum and in the  descending colon, removed with a cold snare. Resected                         and retrieved.                        - Diverticulosis in the recto-sigmoid colon and in the                         sigmoid colon.                        - Non-bleeding external hemorrhoids. Recommendation:        - Discharge patient to home (with escort).                        - Resume previous diet today.                        - Continue present medications.                        - Await pathology results.                        - Repeat colonoscopy in 5 years for surveillance based                         on pathology results. Procedure Code(s):     --- Professional ---                        [phone], Colonoscopy, flexible; with removal of                         tumor(s), polyp(s), or other  lesion(s) by snare                         technique Diagnosis Code(s):     --- Professional ---                        Z12.11, Encounter for screening for malignant neoplasm                         of colon                        K64.4, Residual hemorrhoidal skin tags                        D12.8, Benign neoplasm of rectum                        D12.4, Benign neoplasm of descending colon                        K57.30, Diverticulosis of large intestine without                         perforation or abscess without bleeding CPT copyright 2022 American Medical Association. All rights reserved. The codes documented in this report are preliminary and upon coder review may  be revised to meet current compliance requirements. Dr. Ulyess Mort Lin Landsman MD, MD 11/08/2022 10:46:36 AM This report has been signed electronically. Number of Addenda: 0 Note Initiated On: 11/08/2022 9:57 AM Scope Withdrawal Time: 0 hours 10 minutes 52 seconds  Total Procedure Duration: 0 hours 15 minutes 47 seconds  Estimated Blood Loss:  Estimated blood loss: none.      [facility]

## 2022-11-08 NOTE — Transfer of Care (Signed)
Immediate Anesthesia Transfer of Care Note  Patient: Teresa Gallagher  Procedure(s) Performed: COLONOSCOPY WITH PROPOFOL  Patient Location: PACU and Endoscopy Unit  Anesthesia Type:General  Level of Consciousness: drowsy  Airway & Oxygen Therapy: Patient Spontanous Breathing  Post-op Assessment: Report given to RN  Post vital signs: stable  Last Vitals:  Vitals Value Taken Time  BP 123/89 11/08/22 1108  Temp 36.1 C 11/08/22 1048  Pulse 78 11/08/22 1118  Resp 26 11/08/22 1117  SpO2 100 % 11/08/22 1118  Vitals shown include unvalidated device data.  Last Pain:  Vitals:   11/08/22 1108  TempSrc:   PainSc: 0-No pain         Complications: No notable events documented.

## 2022-11-08 NOTE — Anesthesia Preprocedure Evaluation (Signed)
Anesthesia Evaluation  Patient identified by MRN, date of birth, ID band Patient awake    Reviewed: Allergy & Precautions, NPO status , Patient's Chart, lab work & pertinent test results  Airway Mallampati: II  TM Distance: >3 FB Neck ROM: full    Dental  (+) Teeth Intact, Caps,    Pulmonary neg pulmonary ROS, sleep apnea and Continuous Positive Airway Pressure Ventilation    Pulmonary exam normal breath sounds clear to auscultation       Cardiovascular Exercise Tolerance: Good negative cardio ROS Normal cardiovascular exam Rhythm:Regular     Neuro/Psych  Headaches  Anxiety     negative neurological ROS  negative psych ROS   GI/Hepatic negative GI ROS, Neg liver ROS,,,  Endo/Other  negative endocrine ROS  Morbid obesity  Renal/GU negative Renal ROS  negative genitourinary   Musculoskeletal negative musculoskeletal ROS (+)    Abdominal  (+) + obese  Peds negative pediatric ROS (+)  Hematology negative hematology ROS (+)   Anesthesia Other Findings Past Medical History: No date: Acne No date: Arrhythmia 10/2018: Baker's cyst, right     Comment:  calf No date: Blood transfusion without reported diagnosis No date: Bunion No date: Cervicalgia No date: Chronic kidney disease     Comment:  Kidney Stents No date: Depression No date: Herpes simplex without mention of complication No date: Mastodynia No date: Myalgia and myositis, unspecified No date: Other acne No date: Other malaise and fatigue No date: Painful respiration No date: Palpitations No date: Seborrhea capitis No date: Sleep apnea in adult No date: Symptomatic menopausal or female climacteric states No date: Unspecified constipation No date: Unspecified sleep apnea No date: Unspecified vitamin D deficiency No date: Vitamin D deficiency  Past Surgical History: No date: ABDOMINAL HYSTERECTOMY 11/07/2014: BUNIONECTOMY; Right     Comment:  Dr.  Hulda Humphrey at [place]  No date: CARDIAC CATHETERIZATION 12/19/2010: [place]; Left No date: CESAREAN SECTION No date: LAPAROSCOPIC HYSTERECTOMY     Comment:  followed by 3 cuff repairs 12/19/2010: LIPOMA EXCISION No date: REFRACTIVE SURGERY; Bilateral 09/02/2021: RIGHT/LEFT HEART CATH AND CORONARY ANGIOGRAPHY; N/A     Comment:  Procedure: RIGHT/LEFT HEART CATH AND CORONARY               ANGIOGRAPHY;  Surgeon: Jolaine Artist, MD;                Location: [facility];  Service: Cardiovascular;                Laterality: N/A; No date: TONSILLECTOMY  BMI    Body Mass Index: 34.95 kg/m      Reproductive/Obstetrics negative OB ROS                             Anesthesia Physical Anesthesia Plan  ASA: 3  Anesthesia Plan: General   Post-op Pain Management:    Induction: Intravenous  PONV Risk Score and Plan: Propofol infusion and TIVA  Airway Management Planned: Natural Airway  Additional Equipment:   Intra-op Plan:   Post-operative Plan:   Informed Consent: I have reviewed the patients History and Physical, chart, labs and discussed the procedure including the risks, benefits and alternatives for the proposed anesthesia with the patient or authorized representative who has indicated his/her understanding and acceptance.     Dental Advisory Given  Plan Discussed with: CRNA and Surgeon  Anesthesia Plan Comments:  Anesthesia Quick Evaluation  

## 2022-11-09 ENCOUNTER — Encounter: Payer: Self-pay | Admitting: Gastroenterology

## 2022-11-09 LAB — SURGICAL PATHOLOGY

## 2022-12-07 NOTE — Anesthesia Postprocedure Evaluation (Signed)
Anesthesia Post Note  Patient: Teresa Gallagher  Procedure(s) Performed: COLONOSCOPY WITH PROPOFOL  Patient location during evaluation: PACU Anesthesia Type: General Level of consciousness: awake Pain management: pain level controlled Vital Signs Assessment: post-procedure vital signs reviewed and stable Respiratory status: spontaneous breathing and nonlabored ventilation Cardiovascular status: stable Anesthetic complications: no  No notable events documented.   Last Vitals:  Vitals:   11/08/22 1058 11/08/22 1108  BP: 105/84 123/89  Pulse: 82 70  Resp: 18 16  Temp:    SpO2: 93% 100%    Last Pain:  Vitals:   11/09/22 0746  TempSrc:   PainSc: 0-No pain                 VAN STAVEREN,Brigit Doke

## 2023-01-16 NOTE — Progress Notes (Deleted)
Name: Teresa Gallagher   MRN: [id number]    DOB: 05/12/1961   Date:01/16/2023       Progress Note  Subjective  Chief Complaint  Follow Up  HPI  Sleep Apnea: she was diagnosed with OSA in 2016. Reviewed the last  study with her done in 2021 , she states she had an appointment with Dr. Maxwell Caul - sleep medicine  still take modafinil to help with daytime somnolence. She needs a refill today    SOB/CHF diastolic : seen by pulmonologist and cardiologist, wearing CPAP and using lasix and SOB has improved. Currently SOB usually during anxiety episode. She has some lower extremity edema intermittently , she has mild orthopnea . She had a cath done by Dr. Haroldine Laws and had cardiac cath 09/22 normal EKD    GAD/Major  recurrent depression: she saw Dr. Shea Evans for the first time Nov 10 th, 2020. But switched to Dr. Toy Care. Since Nov 2021 She is on longer term disability since 2023. Dr. Toy Care is still adjusting her medication, she is still very tired in the mornings, she has been drinking wine to sleep since all the medications makes her feel groggy, explained not safe to drink alcohol for sleep    Morbid obesity  BMI is above 35 with co-morbidities such as   OSA, OA and dyslipidemia. She started on Saxenda March 2023 at a weight of 215 lbs, she is out of medication due to [employer] , weight is down to 209.5 lbs. She is interested in trying [place], explained there is a shortage of that also    Dyslipidemia: not on medication discussed healthy diet  The 10-year ASCVD risk score (Arnett DK, et al., 2019) is: 4.4%   Values used to calculate the score:     Age: 34 years     Sex: Female     Is Non-Hispanic African American: Yes     Diabetic: No     Tobacco smoker: No     Systolic Blood Pressure: [id number] mmHg     Is BP treated: No     HDL Cholesterol: 66 mg/dL     Total Cholesterol: 223 mg/dL    Back pain: used to be intermittent but got more intense and with a lot of spasms and was seen by Dr. Mardelle Matte  , had some injection, she has bacofen and meloxicam at home, pain has been worse lately  She states she was doing better when she was having regular massage therapy   Right breast pain:she had mammogram done in [place] and states she will need a biopsy, I don't have a copy of results.   Patient Active Problem List   Diagnosis Date Noted   Encounter for screening colonoscopy 11/08/2022   Adenomatous polyp of descending colon 11/08/2022   Rectal polyp 11/08/2022   Chronic diastolic heart failure ([place]) 06/10/2022   Breast lump on right side at 10 o'clock position 06/10/2022   Circadian rhythm sleep disorder, shift work type 03/23/2021   Obstructive sleep apnea syndrome 03/23/2021   Attention and concentration deficit 01/20/2020   Alcohol use disorder, mild, in early remission 10/29/2019   Old tear of meniscus of right knee 03/20/2019   SOB (shortness of breath) 08/29/2017   Episodic tension-type headache, not intractable 12/20/2016   History of blood transfusion 07/30/2015   GAD (generalized anxiety disorder) 07/30/2015   Chronic constipation 07/13/2015   Insomnia 07/13/2015   MDD (major depressive disorder), recurrent episode, mild ([place]) 07/13/2015   Fibromyalgia syndrome 07/13/2015  Herpes simplex type 2 infection 07/13/2015   Morbid obesity ([place]) 07/13/2015   Sleep apnea with hypersomnolence 07/13/2015   Vitamin D deficiency 07/13/2015    Past Surgical History:  Procedure Laterality Date   ABDOMINAL HYSTERECTOMY     BUNIONECTOMY Right 11/07/2014   Dr. Hulda Humphrey at [facility] Left 12/19/2010   CESAREAN SECTION     COLONOSCOPY WITH PROPOFOL N/A 11/08/2022   Procedure: COLONOSCOPY WITH PROPOFOL;  Surgeon: Lin Landsman, MD;  Location: [facility] ENDOSCOPY;  Service: Gastroenterology;  Laterality: N/A;   LAPAROSCOPIC HYSTERECTOMY     followed by 3 cuff repairs   LIPOMA EXCISION  12/19/2010   REFRACTIVE SURGERY  Bilateral    RIGHT/LEFT HEART CATH AND CORONARY ANGIOGRAPHY N/A 09/02/2021   Procedure: RIGHT/LEFT HEART CATH AND CORONARY ANGIOGRAPHY;  Surgeon: Jolaine Artist, MD;  Location: [facility];  Service: Cardiovascular;  Laterality: N/A;   TONSILLECTOMY      Family History  Problem Relation Age of Onset   Heart attack Father    Alcohol abuse Father    COPD Sister        end stages   Bipolar disorder Brother    Depression Brother    Bipolar disorder Brother    Depression Brother    Alcohol abuse Brother    Heart attack Paternal Uncle    Breast cancer Paternal Aunt    Breast cancer Maternal Aunt     Social History   Tobacco Use   Smoking status: Never   Smokeless tobacco: Never  Substance Use Topics   Alcohol use: Not Currently     Current Outpatient Medications:    albuterol (VENTOLIN HFA) 108 (90 Base) MCG/ACT inhaler, TAKE 2 PUFFS BY MOUTH EVERY 6 HOURS AS NEEDED FOR WHEEZE OR SHORTNESS OF BREATH, Disp: 18 each, Rfl: 1   ARIPiprazole (ABILIFY) 2 MG tablet, Take 4 mg by mouth daily., Disp: , Rfl:    baclofen (LIORESAL) 10 MG tablet, TAKE 1 TABLET BY MOUTH THREE TIMES A DAY AS NEEDED FOR MUSCLE SPASMS, Disp: 30 tablet, Rfl: 0   BD PEN NEEDLE MICRO U/F 32G X 6 MM MISC, USE AS DIRECTED DAILY AT 12 NOON, Disp: 100 each, Rfl: 1   CAPLYTA 10.5 MG CAPS, Take 1 capsule by mouth at bedtime., Disp: , Rfl:    CAPLYTA 42 MG capsule, Take 42 mg by mouth at bedtime., Disp: , Rfl:    DULoxetine (CYMBALTA) 60 MG capsule, Take 1 capsule (60 mg total) by mouth daily. (Patient taking differently: Take 120 mg by mouth daily.), Disp: 30 capsule, Rfl: 1   furosemide (LASIX) 20 MG tablet, TAKE 1 TABLET (20 MG TOTAL) BY MOUTH AS NEEDED FOR FLUID OR EDEMA., Disp: 90 tablet, Rfl: 0   Glucos-Chond-Hyal Ac-Ca Fructo (MOVE FREE JOINT HEALTH ADVANCE PO), Take 1 tablet by mouth daily., Disp: , Rfl:    Glycerin-Hypromellose-PEG 400 (ARTIFICIAL TEARS) 0.2-0.2-1 % SOLN, Place 1 drop into both eyes  daily as needed (dry eyes)., Disp: , Rfl:    hydrOXYzine (VISTARIL) 25 MG capsule, Take 25 mg by mouth 3 (three) times daily., Disp: , Rfl:    KLOR-CON M20 20 MEQ tablet, TAKE 1 TABLET (20 MEQ TOTAL) BY MOUTH DAILY AS NEEDED (TAKE WITH LASIX)., Disp: 90 tablet, Rfl: 0   LORazepam (ATIVAN) 1 MG tablet, Take 0.5-1 mg by mouth daily as needed for anxiety., Disp: , Rfl:    meloxicam (MOBIC) 15 MG  tablet, Take 15 mg by mouth daily., Disp: , Rfl:    Menthol, Topical Analgesic, (BIOFREEZE) 4 % GEL, Apply 1 application topically daily as needed (pain)., Disp: , Rfl:    modafinil (PROVIGIL) 200 MG tablet, Take 1 tablet (200 mg total) by mouth daily., Disp: 90 tablet, Rfl: 0   Multiple Vitamin (MULTIVITAMIN) tablet, Take 1 tablet by mouth daily., Disp: , Rfl:    pregabalin (LYRICA) 50 MG capsule, Take 50 mg by mouth 3 (three) times daily., Disp: , Rfl:    Semaglutide-Weight Management 1.7 MG/0.75ML SOAJ, Inject 1.7 mg into the skin once a week for 28 days., Disp: 3 mL, Rfl: 0   [START ON 02/10/2023] Semaglutide-Weight Management 2.4 MG/0.75ML SOAJ, Inject 2.4 mg into the skin once a week for 28 days., Disp: 3 mL, Rfl: 0   terbinafine (LAMISIL) 250 MG tablet, Take 1 tablet (250 mg total) by mouth daily., Disp: 90 tablet, Rfl: 0   traZODone (DESYREL) 50 MG tablet, Take 50-150 mg by mouth at bedtime as needed., Disp: , Rfl:    triamcinolone acetonide (KENALOG-40) 40 MG/ML injection, SMARTSIG:2 Milliliter(s) IM, Disp: , Rfl:    valACYclovir (VALTREX) 500 MG tablet, TAKE 1 TABLET (500 MG TOTAL) BY MOUTH DAILY. AND TWICE DAILY FOR OUTBREAKS (Patient taking differently: Take 500 mg by mouth daily as needed (outbreaks).), Disp: 115 tablet, Rfl: 1   zinc gluconate 50 MG tablet, Take 50 mg by mouth daily., Disp: , Rfl:   No Known Allergies  I personally reviewed active problem list, medication list, allergies, family history, social history, health maintenance with the patient/caregiver  today.   ROS  ***  Objective  There were no vitals filed for this visit.  There is no height or weight on file to calculate BMI.  Physical Exam ***  Recent Results (from the past 2160 hour(s))  Surgical pathology     Status: None   Collection Time: 11/08/22 10:04 AM  Result Value Ref Range   SURGICAL PATHOLOGY      SURGICAL PATHOLOGY CASE: [id number] PATIENT: Gara Kroner Surgical Pathology Report     Specimen Submitted: A. Colon polyp, des; cold snare B. Rectum polyp x1; cold snare  Clinical History: Screening colonscopy. Colon polyp, rectal polyp, diverticulosis.      DIAGNOSIS: A.  COLON, DESCENDING, POLYP; COLD SNARE BIOPSIES - MULTIPLE FRAGMENTS OF TUBULAR ADENOMA. - NO EVIDENCE OF HIGH-GRADE DYSPLASIA OR MALIGNANCY.  B.  RECTUM, POLYP; COLD SNARE BIOPSIES: - OVERALL FEATURES FAVOR SESSILE SERRATED POLYP FRAGMENTS  WITH SUPERIMPOSED PROLAPSE CHANGE. - NO EVIDENCE OF HIGH-GRADE DYSPLASIA OR MALIGNANCY.   GROSS DESCRIPTION: A. Labeled: Cold snare descending colon polyp Received: Formalin Collection time: 10:04 AM on 11/08/2022 Placed into formalin time: 10:04 AM on 11/08/2022 Tissue fragment(s): 4 Size: Aggregate, 1.9 x 0.8 x 0.2 cm Description: Received are fragments of tan-pink soft tissue.  The largest fragment has a resection margin which is inked  green.  This fragment is serially sectioned. Entirely submitted in cassettes 1-2 with the serially sectioned fragment in cassette 1 and the remaining fragments in cassette 2.  B. Labeled: Cold snare rectal polyp x 1 Received: Formalin Collection time: 10:42 AM on 11/08/2022 Placed into formalin time: 10:42 AM on 11/08/2022 Tissue fragment(s): Multiple Size: Aggregate, 1 x 0.5 x 0.4 cm Description: Received is a single fragment of tan-pink soft tissue admixed with intestinal debris.  The ratio of soft tissue to intestinal debris is 70: 30.  The soft tissue fragment has a resection margin  which is  inked black.  This fragment is bisected. Entirely submitted in 1 cassette.  RB 11/08/2022  Final Diagnosis performed by Theodora Blow, MD.   Electronically signed 11/09/2022 12:51:21PM The electronic signature indicates that the named Attending Pathologist has evaluated the specimen Technical component performed at [facility], [address], [place], [place] [address] Lab: [place] Dir: Rush Farmer, MD, MMM  Professional component performed at [facility], [facility], [place], [place], [place] [address] Lab: [phone] Dir: Kathi Simpers, MD     PHQ2/9:    10/17/2022    3:38 PM 06/10/2022   11:29 AM 02/21/2022    1:45 PM 02/21/2022    1:38 PM 09/27/2021    1:55 PM  Depression screen PHQ 2/9  Decreased Interest '3 3 3 '$ 0 3  Down, Depressed, Hopeless '3 2 3 '$ 0 3  PHQ - 2 Score '6 5 6 '$ 0 6  Altered sleeping '3 3 2 '$ 0 2  Tired, decreased energy '3 3 3 '$ 0 3  Change in appetite 3 0 2 0 2  Feeling bad or failure about yourself  '2 2 2 '$ 0 0  Trouble concentrating '3 3 2 '$ 0 3  Moving slowly or fidgety/restless 2 0 0 0 2  Suicidal thoughts 0 0 0 0 0  PHQ-9 Score '22 16 17 '$ 0 18  Difficult doing work/chores Extremely dIfficult        phq 9 is {gen pos [id number]   Fall Risk:    10/17/2022    3:30 PM 06/10/2022   11:21 AM 02/21/2022    1:37 PM 11/17/2021    4:43 PM 10/18/2021    9:47 AM  Fall Risk   Falls in the past year? 1 0 0 1 1  Comment    no falls since previous visit; working on PT for fall prevention no falls since previous visit  Number falls in past yr: 1 0 0    Injury with Fall? 1 0 0    Risk for fall due to : History of fall(s) No Fall Risks No Fall Risks    Follow up Falls prevention discussed;Education provided;Falls evaluation completed Falls prevention discussed Falls prevention discussed        Functional Status Survey:      Assessment & Plan  *** There are no diagnoses linked to this encounter.

## 2023-01-17 ENCOUNTER — Ambulatory Visit: Payer: BC Managed Care – PPO | Admitting: Family Medicine

## 2023-02-16 ENCOUNTER — Other Ambulatory Visit: Payer: Self-pay | Admitting: Family Medicine

## 2023-02-17 NOTE — Progress Notes (Unsigned)
Name: Teresa Gallagher   MRN: [id number]    DOB: 1961/10/12   Date:02/20/2023       Progress Note  Subjective  Chief Complaint  Medication Refill  I connected with  Teresa Gallagher  on 02/20/23 at  8:40 AM EST by a video enabled telemedicine application and verified that I am speaking with the correct person using two identifiers.  I discussed the limitations of evaluation and management by telemedicine and the availability of in person appointments. The patient expressed understanding and agreed to proceed with the virtual visit  Staff also discussed with the patient that there may be a patient responsible charge related to this service. Patient Location: at home  Provider Location: [facility] Additional Individuals present: alone   HPI  Sleep Apnea: she was diagnosed with OSA in 2016. Reviewed the last  study with her done in 2021 , she states she had an appointment with Dr. Maxwell Gallagher - sleep medicine  still take modafinil to help with daytime somnolence, she still has medication of provigil at home    SOB/chronic CHF diastolic : seen by pulmonologist and cardiologist, wearing CPAP and using lasix and SOB has improved.  She has some lower extremity edema intermittently , she has mild orthopnea . She had a cath done by Dr. Haroldine Gallagher and had cardiac cath 09/22 normal EKG Symptoms of SOB was getting worse but improved with lasix    GAD/Major  recurrent depression: she saw Dr. Shea Gallagher for the first time Nov 10 th, 2020. But switched to Dr. Toy Gallagher since Nov 2021 She is on longer term disability since 2023. Dr. Toy Gallagher she has been compliant with her medications    Morbid obesity  BMI is above 35 with co-morbidities such as   OSA, OA and dyslipidemia. She started on Saxenda March 2023 at a weight of 215 lbs, she is out of medication due to [employer] , weight was  down to 209.5 lbs in October, we sent rx for [facility] back in October but she is now on the 3rd 0.25 mg dose - due to [place] shortage She  has noticed some fatigue. Denies nausea, change in bowel movements. Her weight at home had gone up to 219 lbs when off all medications even though she was doing intermittent fasting weight had only gone down to 216 lbs and  today is 212 lbs at home   Dyslipidemia: not on medication discussed healthy diet  The 10-year ASCVD risk score (Arnett DK, et al., 2019) is: 5.7%   Values used to calculate the score:     Age: 62 years     Sex: Female     Is Non-Hispanic African American: Yes     Diabetic: No     Tobacco smoker: No     Systolic Blood Pressure: [id number]     Is BP treated: No     HDL Cholesterol: 66 mg/dL     Total Cholesterol: 223 mg/dL    Back pain: used to be intermittent but got more intense and with a lot of spasms and was seen by Dr. Mardelle Gallagher , had some injections, she is currently taking Tylenol and Naproxen once a day and sometimes takes Meloxicam , explained she cannot take meloxicam and naproxen   Patient Active Problem List   Diagnosis Date Noted   Encounter for screening colonoscopy 11/08/2022   Adenomatous polyp of descending colon 11/08/2022   Rectal polyp 11/08/2022   Chronic diastolic heart failure ([place]) 06/10/2022   Breast  lump on right side at 10 o'clock position 06/10/2022   Circadian rhythm sleep disorder, shift work type 03/23/2021   Obstructive sleep apnea syndrome 03/23/2021   Attention and concentration deficit 01/20/2020   Alcohol use disorder, mild, in early remission 10/29/2019   Old tear of meniscus of right knee 03/20/2019   SOB (shortness of breath) 08/29/2017   Episodic tension-type headache, not intractable 12/20/2016   History of blood transfusion 07/30/2015   GAD (generalized anxiety disorder) 07/30/2015   Chronic constipation 07/13/2015   Insomnia 07/13/2015   MDD (major depressive disorder), recurrent episode, mild ([place]) 07/13/2015   Fibromyalgia syndrome 07/13/2015   Herpes simplex type 2 infection 07/13/2015   Morbid obesity ([place])  07/13/2015   Sleep apnea with hypersomnolence 07/13/2015   Vitamin D deficiency 07/13/2015    Past Surgical History:  Procedure Laterality Date   ABDOMINAL HYSTERECTOMY     BUNIONECTOMY Right 11/07/2014   Dr. Hulda Gallagher at [facility] Left 12/19/2010   CESAREAN SECTION     COLONOSCOPY WITH PROPOFOL N/A 11/08/2022   Procedure: COLONOSCOPY WITH PROPOFOL;  Surgeon: Teresa Landsman, MD;  Location: [place];  Service: Gastroenterology;  Laterality: N/A;   LAPAROSCOPIC HYSTERECTOMY     followed by 3 cuff repairs   LIPOMA EXCISION  12/19/2010   REFRACTIVE SURGERY Bilateral    RIGHT/LEFT HEART CATH AND CORONARY ANGIOGRAPHY N/A 09/02/2021   Procedure: RIGHT/LEFT HEART CATH AND CORONARY ANGIOGRAPHY;  Surgeon: Teresa Artist, MD;  Location: [place] [facility];  Service: Cardiovascular;  Laterality: N/A;   TONSILLECTOMY      Family History  Problem Relation Age of Onset   Heart attack Father    Alcohol abuse Father    COPD Sister        end stages   Bipolar disorder Brother    Depression Brother    Bipolar disorder Brother    Depression Brother    Alcohol abuse Brother    Heart attack Paternal Uncle    Breast cancer Paternal Aunt    Breast cancer Maternal Aunt     Social History   Socioeconomic History   Marital status: Divorced    Spouse name: larry   Number of children: 3   Years of education: Not on file   Highest education level: Bachelor's degree (e.g., BA, AB, BS)  Occupational History   Not on file  Tobacco Use   Smoking status: Never   Smokeless tobacco: Never  Vaping Use   Vaping Use: Never used  Substance and Sexual Activity   Alcohol use: Not Currently   Drug use: No   Sexual activity: Not Currently  Other Topics Concern   Not on file  Social History Narrative   She has been on disability since end of 2021   She has a grown son that has Down's syndrome that lives with her       Social  Determinants of Health   Financial Resource Strain: Low Risk  (08/09/2021)   Overall Financial Resource Strain (CARDIA)    Difficulty of Paying Living Expenses: Not hard at all  Food Insecurity: No Food Insecurity (08/09/2021)   Hunger Vital Sign    Worried About Running Out of Food in the Last Year: Never true    [place] in the Last Year: Never true  Transportation Needs: No Transportation Needs (08/09/2021)   PRAPARE - Hydrologist (Medical): No  Lack of Transportation (Non-Medical): No  Physical Activity: Inactive (08/09/2021)   Exercise Vital Sign    Days of Exercise per Week: 0 days    Minutes of Exercise per Session: 0 min  Stress: Stress Concern Present (08/09/2021)   Force    Feeling of Stress : Very much  Social Connections: Socially Isolated (08/09/2021)   Social Connection and Isolation Panel [NHANES]    Frequency of Communication with Friends and Family: Twice a week    Frequency of Social Gatherings with Friends and Family: Once a week    Attends Religious Services: Never    Marine scientist or Organizations: No    Attends Music therapist: Not on file    Marital Status: Divorced  Intimate Partner Violence: Not At Risk (08/09/2021)   Humiliation, Afraid, Rape, and Kick questionnaire    Fear of Current or Ex-Partner: No    Emotionally Abused: No    Physically Abused: No    Sexually Abused: No     Current Outpatient Medications:    baclofen (LIORESAL) 10 MG tablet, TAKE 1 TABLET BY MOUTH THREE TIMES A DAY AS NEEDED FOR MUSCLE SPASMS, Disp: 30 tablet, Rfl: 0   BD PEN NEEDLE MICRO U/F 32G X 6 MM MISC, USE AS DIRECTED DAILY AT 12 NOON, Disp: 100 each, Rfl: 1   furosemide (LASIX) 20 MG tablet, Take 1 tablet (20 mg total) by mouth as needed for fluid or edema., Disp: 90 tablet, Rfl: 0   Glucos-Chond-Hyal Ac-Ca Fructo (Manteno), Take 1 tablet by mouth daily., Disp: , Rfl:    Glycerin-Hypromellose-PEG 400 (ARTIFICIAL TEARS) 0.2-0.2-1 % SOLN, Place 1 drop into both eyes daily as needed (dry eyes)., Disp: , Rfl:    hydrOXYzine (VISTARIL) 25 MG capsule, Take 25 mg by mouth 3 (three) times daily., Disp: , Rfl:    meloxicam (MOBIC) 15 MG tablet, Take 15 mg by mouth daily., Disp: , Rfl:    Menthol, Topical Analgesic, (BIOFREEZE) 4 % GEL, Apply 1 application topically daily as needed (pain)., Disp: , Rfl:    modafinil (PROVIGIL) 200 MG tablet, Take 1 tablet (200 mg total) by mouth daily., Disp: 90 tablet, Rfl: 0   Multiple Vitamin (MULTIVITAMIN) tablet, Take 1 tablet by mouth daily., Disp: , Rfl:    potassium chloride SA (KLOR-CON M20) 20 MEQ tablet, Take 1 tablet (20 mEq total) by mouth daily as needed. When you take lasix, Disp: 90 tablet, Rfl: 0   pregabalin (LYRICA) 50 MG capsule, Take 50 mg by mouth 3 (three) times daily., Disp: , Rfl:    Semaglutide-Weight Management 0.25 MG/0.5ML SOAJ, Inject 0.25 mg into the skin once a week for 28 days., Disp: 2 mL, Rfl: 0   [START ON 04/19/2023] Semaglutide-Weight Management 1 MG/0.5ML SOAJ, Inject 1 mg into the skin once a week for 28 days., Disp: 2 mL, Rfl: 0   [START ON 05/18/2023] Semaglutide-Weight Management 1.7 MG/0.75ML SOAJ, Inject 1.7 mg into the skin once a week for 28 days., Disp: 3 mL, Rfl: 0   terbinafine (LAMISIL) 250 MG tablet, Take 1 tablet (250 mg total) by mouth daily., Disp: 90 tablet, Rfl: 0   traZODone (DESYREL) 50 MG tablet, Take 50-150 mg by mouth at bedtime as needed., Disp: , Rfl:    triamcinolone acetonide (KENALOG-40) 40 MG/ML injection, SMARTSIG:2 Milliliter(s) IM, Disp: , Rfl:    valACYclovir (VALTREX) 500 MG tablet, Take 1 tablet (500 mg  total) by mouth daily. And twice daily for outbreaks, Disp: 115 tablet, Rfl: 1   zinc gluconate 50 MG tablet, Take 50 mg by mouth daily., Disp: , Rfl:    albuterol (VENTOLIN HFA) 108 (90 Base) MCG/ACT inhaler, TAKE 2 PUFFS  BY MOUTH EVERY 6 HOURS AS NEEDED FOR WHEEZE OR SHORTNESS OF BREATH (Patient not taking: Reported on 02/20/2023), Disp: 18 each, Rfl: 1   ARIPiprazole (ABILIFY) 2 MG tablet, Take 4 mg by mouth daily. (Patient not taking: Reported on 02/20/2023), Disp: , Rfl:    CAPLYTA 10.5 MG CAPS, Take 1 capsule by mouth at bedtime. (Patient not taking: Reported on 02/20/2023), Disp: , Rfl:    CAPLYTA 42 MG capsule, Take 42 mg by mouth at bedtime. (Patient not taking: Reported on 02/20/2023), Disp: , Rfl:    DULoxetine (CYMBALTA) 60 MG capsule, Take 1 capsule (60 mg total) by mouth daily. (Patient not taking: Reported on 02/20/2023), Disp: 30 capsule, Rfl: 1   LORazepam (ATIVAN) 1 MG tablet, Take 0.5-1 mg by mouth daily as needed for anxiety. (Patient not taking: Reported on 02/20/2023), Disp: , Rfl:   No Known Allergies  I personally reviewed active problem list, medication list, allergies, family history, social history, health maintenance with the patient/caregiver today.   ROS  Ten systems reviewed and is negative except as mentioned in HPI   Objective  Virtual encounter, vitals not obtained.  Body mass index is 34.95 kg/m.  Physical Exam  Awake, alert and oriented  PHQ2/9:    02/20/2023    9:29 AM 10/17/2022    3:38 PM 06/10/2022   11:29 AM 02/21/2022    1:45 PM 02/21/2022    1:38 PM  Depression screen PHQ 2/9  Decreased Interest '3 3 3 3 '$ 0  Down, Depressed, Hopeless '3 3 2 3 '$ 0  PHQ - 2 Score '6 6 5 6 '$ 0  Altered sleeping '1 3 3 2 '$ 0  Tired, decreased energy '3 3 3 3 '$ 0  Change in appetite 0 3 0 2 0  Feeling bad or failure about yourself  '3 2 2 2 '$ 0  Trouble concentrating '3 3 3 2 '$ 0  Moving slowly or fidgety/restless 0 2 0 0 0  Suicidal thoughts 0 0 0 0 0  PHQ-9 Score '16 22 16 17 '$ 0  Difficult doing work/chores  Extremely dIfficult      PHQ-2/9 Result is positive.     Fall Risk:    02/20/2023    7:55 AM 10/17/2022    3:30 PM 06/10/2022   11:21 AM 02/21/2022    1:37 PM 11/17/2021    4:43 PM  Fall Risk    Falls in the past year? 0 1 0 0 1  Comment     no falls since previous visit; working on PT for fall prevention  Number falls in past yr: 0 1 0 0   Injury with Fall? 0 1 0 0   Risk for fall due to : No Fall Risks History of fall(s) No Fall Risks No Fall Risks   Follow up Falls prevention discussed Falls prevention discussed;Education provided;Falls evaluation completed Falls prevention discussed Falls prevention discussed      Assessment & Plan  1. Morbid obesity ([place])  - Semaglutide-Weight Management 1 MG/0.5ML SOAJ; Inject 1 mg into the skin once a week for 28 days.  Dispense: 2 mL; Refill: 0 - Semaglutide-Weight Management 1.7 MG/0.75ML SOAJ; Inject 1.7 mg into the skin once a week for 28 days.  Dispense: 3 mL; Refill:  0 - Semaglutide-Weight Management 0.25 MG/0.5ML SOAJ; Inject 0.25 mg into the skin once a week for 28 days.  Dispense: 2 mL; Refill: 0  2. Moderate episode of recurrent major depressive disorder ([place])   3. Chronic diastolic heart failure ([facility])  - furosemide (LASIX) 20 MG tablet; Take 1 tablet (20 mg total) by mouth as needed for fluid or edema.  Dispense: 90 tablet; Refill: 0 - potassium chloride SA (KLOR-CON M20) 20 MEQ tablet; Take 1 tablet (20 mEq total) by mouth daily as needed. When you take lasix  Dispense: 90 tablet; Refill: 0  4. Herpes simplex type 2 infection  - valACYclovir (VALTREX) 500 MG tablet; Take 1 tablet (500 mg total) by mouth daily. And twice daily for outbreaks  Dispense: 115 tablet; Refill: 1  5. Vitamin D deficiency  Take supplementation    I discussed the assessment and treatment plan with the patient. The patient was provided an opportunity to ask questions and all were answered. The patient agreed with the plan and demonstrated an understanding of the instructions.  The patient was advised to call back or seek an in-person evaluation if the symptoms worsen or if the condition fails to improve as anticipated.  I provided 25 minutes of  non-face-to-face time during this encounter.

## 2023-02-20 ENCOUNTER — Encounter: Payer: Self-pay | Admitting: Family Medicine

## 2023-02-20 ENCOUNTER — Telehealth (INDEPENDENT_AMBULATORY_CARE_PROVIDER_SITE_OTHER): Payer: BC Managed Care – PPO | Admitting: Family Medicine

## 2023-02-20 DIAGNOSIS — I5032 Chronic diastolic (congestive) heart failure: Secondary | ICD-10-CM

## 2023-02-20 DIAGNOSIS — E559 Vitamin D deficiency, unspecified: Secondary | ICD-10-CM

## 2023-02-20 DIAGNOSIS — B009 Herpesviral infection, unspecified: Secondary | ICD-10-CM | POA: Diagnosis not present

## 2023-02-20 DIAGNOSIS — Z6834 Body mass index (BMI) 34.0-34.9, adult: Secondary | ICD-10-CM

## 2023-02-20 DIAGNOSIS — F331 Major depressive disorder, recurrent, moderate: Secondary | ICD-10-CM

## 2023-02-20 MED ORDER — VALACYCLOVIR HCL 500 MG PO TABS: 500.0000 mg | ORAL_TABLET | Freq: Every day | ORAL | 1 refills | Status: DC

## 2023-02-20 MED ORDER — SEMAGLUTIDE-WEIGHT MANAGEMENT 0.25 MG/0.5ML ~~LOC~~ SOAJ: 0.2500 mg | SUBCUTANEOUS | 0 refills | Status: AC

## 2023-02-20 MED ORDER — POTASSIUM CHLORIDE CRYS ER 20 MEQ PO TBCR: 20.0000 meq | EXTENDED_RELEASE_TABLET | Freq: Every day | ORAL | 0 refills | Status: DC | PRN

## 2023-02-20 MED ORDER — SEMAGLUTIDE-WEIGHT MANAGEMENT 1.7 MG/0.75ML ~~LOC~~ SOAJ: 1.7000 mg | SUBCUTANEOUS | 0 refills | Status: AC

## 2023-02-20 MED ORDER — SEMAGLUTIDE-WEIGHT MANAGEMENT 1 MG/0.5ML ~~LOC~~ SOAJ: 1.0000 mg | SUBCUTANEOUS | 0 refills | Status: AC

## 2023-02-20 MED ORDER — FUROSEMIDE 20 MG PO TABS: 20.0000 mg | ORAL_TABLET | ORAL | 0 refills | Status: DC | PRN

## 2023-03-16 ENCOUNTER — Encounter: Payer: Self-pay | Admitting: Cardiovascular Disease

## 2023-03-16 ENCOUNTER — Ambulatory Visit: Payer: BC Managed Care – PPO | Attending: Cardiovascular Disease | Admitting: Cardiovascular Disease

## 2023-03-16 DIAGNOSIS — I5032 Chronic diastolic (congestive) heart failure: Secondary | ICD-10-CM | POA: Diagnosis not present

## 2023-03-16 DIAGNOSIS — E668 Other obesity: Secondary | ICD-10-CM | POA: Diagnosis not present

## 2023-03-16 DIAGNOSIS — F32A Depression, unspecified: Secondary | ICD-10-CM | POA: Diagnosis not present

## 2023-03-16 DIAGNOSIS — G4733 Obstructive sleep apnea (adult) (pediatric): Secondary | ICD-10-CM | POA: Diagnosis not present

## 2023-03-16 NOTE — Progress Notes (Signed)
Cardiology Office Note    Date:  03/22/2023   ID:  Teresa Gallagher, DOB 1961/03/20, MRN [id number]  PCP:  Steele Sizer, MD  Cardiologist:  Shelva Majestic, MD   New sleep evaluation  Chief Complaint  Patient presents with   New Patient (Initial Visit)         History of Present Illness:  Teresa Gallagher is a 62 y.o. female who is followed by Lajoyce Corners at [facility].  She has a history of dyspnea on exertion and is felt to have diastolic CHF. She had previously undergone right and left heart cardiac catheterization which showed normal coronary arteries and no evidence for exercise-induced pulmonary arterial hypertension with saline bag exercise.  He patient has a history of obstructive sleep apnea and remotely had seen Dr. Elenore Rota who has since retired.  She apparently was diagnosed with obstructive sleep apnea in 2016.  I do not know the specifics of her sleep apnea severity on her diagnostic evaluation but apparently she underwent a CPAP titration study and she was titrated up to 10 cm of water pressure.  Her titration study was interpreted by Dr. Mickeal Skinner, [place]. Apparently, she had another sleep evaluation in September 2021.  Again I do not have data regarding her sleep apnea severity without treatment but she underwent a CPAP titration study interpreted by Dr. Tracie Harrier and it was recommended that she be on CPAP at a set pressure of 15 cm of water.  Ms. Cuppy has a significant history of depression for which she is followed by Dr. Toy Care of psychiatry for the past 3 to 4 years.  Her sleep has been very poor.  She often works night shifts and has had multiple jobs leading to irregular sleep and the regular use of CPAP.  She stopped working over the last several years but still goes to bed typically after midnight and only sleeps approximately 4 to 5 hours.  She has been on modafinil 200 mg for excessive daytime sleepiness.  She was recently started on  semaglutide for obesity.  She is on trazodone for depression.  Her CPAP had not been managed by [place].  She apparently recently was using [employer] as her DME company.  We were able to contact them to obtain a download and her machine was linked today for assessment.  A download from February 27 through March 15, 2023 shows poor use at only 11 of 30 days.  Usage greater than 4 hours was only 27%.  Average use was 4 hours and 7 minutes.  Her AirSense 11 AutoSet unit which she received on January 27, 2021 is set at a pressure range of 6 to 15 cm of water.  AHI is 2.1.  She has been using a ResMed F30 fullface mask, medium.  She presents for her initial evaluation with me.   Past Medical History:  Diagnosis Date   Acne    Arrhythmia    Baker's cyst, right 10/2018   calf   Blood transfusion without reported diagnosis    Bunion    Cervicalgia    Chronic kidney disease    Kidney Stents   Depression    Herpes simplex without mention of complication    Mastodynia    Myalgia and myositis, unspecified    Other acne    Other malaise and fatigue    Painful respiration    Palpitations    Seborrhea capitis    Sleep apnea in adult    Symptomatic  menopausal or female climacteric states    Unspecified constipation    Unspecified sleep apnea    Unspecified vitamin D deficiency    Vitamin D deficiency     Past Surgical History:  Procedure Laterality Date   ABDOMINAL HYSTERECTOMY     BUNIONECTOMY Right 11/07/2014   Dr. Hulda Humphrey at [place] Left 12/19/2010   CESAREAN SECTION     COLONOSCOPY WITH PROPOFOL N/A 11/08/2022   Procedure: COLONOSCOPY WITH PROPOFOL;  Surgeon: Lin Landsman, MD;  Location: [place];  Service: Gastroenterology;  Laterality: N/A;   LAPAROSCOPIC HYSTERECTOMY     followed by 3 cuff repairs   LIPOMA EXCISION  12/19/2010   REFRACTIVE SURGERY Bilateral    RIGHT/LEFT HEART CATH AND CORONARY ANGIOGRAPHY N/A  09/02/2021   Procedure: RIGHT/LEFT HEART CATH AND CORONARY ANGIOGRAPHY;  Surgeon: Jolaine Artist, MD;  Location: [place] [facility];  Service: Cardiovascular;  Laterality: N/A;   TONSILLECTOMY      Current Medications: Outpatient Medications Prior to Visit  Medication Sig Dispense Refill   albuterol (VENTOLIN HFA) 108 (90 Base) MCG/ACT inhaler TAKE 2 PUFFS BY MOUTH EVERY 6 HOURS AS NEEDED FOR WHEEZE OR SHORTNESS OF BREATH 18 each 1   baclofen (LIORESAL) 10 MG tablet TAKE 1 TABLET BY MOUTH THREE TIMES A DAY AS NEEDED FOR MUSCLE SPASMS 30 tablet 0   BD PEN NEEDLE MICRO U/F 32G X 6 MM MISC USE AS DIRECTED DAILY AT 12 NOON 100 each 1   CAPLYTA 10.5 MG CAPS Take 1 capsule by mouth at bedtime.     CAPLYTA 42 MG capsule Take 42 mg by mouth at bedtime.     furosemide (LASIX) 20 MG tablet Take 1 tablet (20 mg total) by mouth as needed for fluid or edema. 90 tablet 0   Glucos-Chond-Hyal Ac-Ca Fructo (MOVE FREE JOINT HEALTH ADVANCE PO) Take 1 tablet by mouth daily.     Glycerin-Hypromellose-PEG 400 (ARTIFICIAL TEARS) 0.2-0.2-1 % SOLN Place 1 drop into both eyes daily as needed (dry eyes).     hydrOXYzine (VISTARIL) 25 MG capsule Take 25 mg by mouth 3 (three) times daily.     LORazepam (ATIVAN) 1 MG tablet Take 0.5-1 mg by mouth daily as needed for anxiety.     meloxicam (MOBIC) 15 MG tablet Take 15 mg by mouth daily.     Menthol, Topical Analgesic, (BIOFREEZE) 4 % GEL Apply 1 application topically daily as needed (pain).     modafinil (PROVIGIL) 200 MG tablet Take 1 tablet (200 mg total) by mouth daily. 90 tablet 0   Multiple Vitamin (MULTIVITAMIN) tablet Take 1 tablet by mouth daily.     potassium chloride SA (KLOR-CON M20) 20 MEQ tablet Take 1 tablet (20 mEq total) by mouth daily as needed. When you take lasix 90 tablet 0   pregabalin (LYRICA) 50 MG capsule Take 50 mg by mouth 3 (three) times daily.     Semaglutide-Weight Management 0.25 MG/0.5ML SOAJ Inject 0.25 mg into the skin once a week for  28 days. 2 mL 0   [START ON 04/19/2023] Semaglutide-Weight Management 1 MG/0.5ML SOAJ Inject 1 mg into the skin once a week for 28 days. 2 mL 0   [START ON 05/18/2023] Semaglutide-Weight Management 1.7 MG/0.75ML SOAJ Inject 1.7 mg into the skin once a week for 28 days. 3 mL 0   terbinafine (LAMISIL) 250 MG tablet Take 1 tablet (250 mg total) by mouth daily.  90 tablet 0   traZODone (DESYREL) 50 MG tablet Take 50-150 mg by mouth at bedtime as needed.     triamcinolone acetonide (KENALOG-40) 40 MG/ML injection SMARTSIG:2 Milliliter(s) IM     valACYclovir (VALTREX) 500 MG tablet Take 1 tablet (500 mg total) by mouth daily. And twice daily for outbreaks 115 tablet 1   zinc gluconate 50 MG tablet Take 50 mg by mouth daily.     ARIPiprazole (ABILIFY) 2 MG tablet Take 4 mg by mouth daily.     DULoxetine (CYMBALTA) 60 MG capsule Take 1 capsule (60 mg total) by mouth daily. 30 capsule 1   No facility-administered medications prior to visit.     Allergies:   Patient has no known allergies.   Social History   Socioeconomic History   Marital status: Divorced    Spouse name: larry   Number of children: 3   Years of education: Not on file   Highest education level: Bachelor's degree (e.g., BA, AB, BS)  Occupational History   Not on file  Tobacco Use   Smoking status: Never   Smokeless tobacco: Never  Vaping Use   Vaping Use: Never used  Substance and Sexual Activity   Alcohol use: Not Currently   Drug use: No   Sexual activity: Not Currently  Other Topics Concern   Not on file  Social History Narrative   She has been on disability since end of 2021   She has a grown son that has Down's syndrome that lives with her       Social Determinants of Health   Financial Resource Strain: Low Risk  (08/09/2021)   Overall Financial Resource Strain (CARDIA)    Difficulty of Paying Living Expenses: Not hard at all  Food Insecurity: No Food Insecurity (08/09/2021)   Hunger Vital Sign    Worried About  Running Out of Food in the Last Year: Never true    [place] in the Last Year: Never true  Transportation Needs: No Transportation Needs (08/09/2021)   PRAPARE - Hydrologist (Medical): No    Lack of Transportation (Non-Medical): No  Physical Activity: Inactive (08/09/2021)   Exercise Vital Sign    Days of Exercise per Week: 0 days    Minutes of Exercise per Session: 0 min  Stress: Stress Concern Present (08/09/2021)   [place]    Feeling of Stress : Very much  Social Connections: Socially Isolated (08/09/2021)   Social Connection and Isolation Panel [NHANES]    Frequency of Communication with Friends and Family: Twice a week    Frequency of Social Gatherings with Friends and Family: Once a week    Attends Religious Services: Never    Marine scientist or Organizations: No    Attends Music therapist: Not on file    Marital Status: Divorced    Socially she was born in [place].  She is divorced since 2004.  She has 3 children.  Her son Fritz Pickerel lives with her at home.  She has worked as a Dealer.  There is no tobacco or alcohol use.  She does not exercise.  Family History:  The patient's family history includes Alcohol abuse in her brother and father; Bipolar disorder in her brother and brother; Breast cancer in her maternal aunt and paternal aunt; COPD in her sister; Depression in her brother and brother; Heart attack in her father and paternal  uncle.   Her mother died at age 52 during childbirth.  Her father died suddenly from an MI at age 29.  She has 3 brothers 1 age 69 who has sleep apnea.  2 of her 3 children, a daughter age 67 and a son age 3 have sleep apnea.  ROS General: Negative; No fevers, chills, or night sweats; mild obesity HEENT: Negative; No changes in vision or hearing, sinus congestion, difficulty swallowing Pulmonary: Negative; No cough,  wheezing, shortness of breath, hemoptysis Cardiovascular: Negative; No chest pain, presyncope, syncope, palpitations GI: Negative; No nausea, vomiting, diarrhea, or abdominal pain GU: Negative; No dysuria, hematuria, or difficulty voiding Musculoskeletal: Negative; no myalgias, joint pain, or weakness Hematologic/Oncology: Negative; no easy bruising, bleeding Endocrine: Negative; no heat/cold intolerance; no diabetes Neuro: Negative; no changes in balance, headaches Skin: Negative; No rashes or skin lesions Psychiatric: Positive for depression Sleep: Obstructive sleep apnea initially diagnosed in 2016.  Positive for snoring, daytime sleepiness, nonrestorative sleep.  Poor sleep hygiene.   Epworth Sleepiness Scale: Situation   Chance of Dozing/Sleeping (0 = never , 1 = slight chance , 2 = moderate chance , 3 = high chance )   sitting and reading 2   watching TV 3   sitting inactive in a public place 2   being a passenger in a motor vehicle for an hour or more 1   lying down in the afternoon 2   sitting and talking to someone 1   sitting quietly after lunch (no alcohol) 1   while stopped for a few minutes in traffic as the driver 0   Total Score  12     Other comprehensive 14 point system review is negative.   PHYSICAL EXAM:   VS:  BP 102/80 (BP Location: Left Arm, Patient Position: Sitting, Cuff Size: Large)   Pulse 79   Ht 5\' 5"  (1.651 m)   Wt 209 lb (94.8 kg)   BMI 34.78 kg/m     Repeat blood pressure by me 110/72.  Wt Readings from Last 3 Encounters:  03/16/23 209 lb (94.8 kg)  02/20/23 210 lb (95.3 kg)  11/08/22 210 lb (95.3 kg)    General: Alert, oriented, no distress.  Skin: normal turgor, no rashes, warm and dry HEENT: Normocephalic, atraumatic. Pupils equal round and reactive to light; sclera anicteric; extraocular muscles intact;  Nose without nasal septal hypertrophy Mouth/Parynx: Large tongue; Mallinpatti scale 4 Neck: No JVD, no carotid bruits; normal  carotid upstroke Lungs: clear to ausculatation and percussion; no wheezing or rales Chest wall: without tenderness to palpitation Heart: PMI not displaced, RRR, s1 s2 normal, 1/6 systolic murmur, no diastolic murmur, no rubs, gallops, thrills, or heaves Abdomen: soft, nontender; no hepatosplenomehaly, BS+; abdominal aorta nontender and not dilated by palpation. Back: no CVA tenderness Pulses 2+ Musculoskeletal: full range of motion, normal strength, no joint deformities Extremities: no clubbing cyanosis or edema, Homan's sign negative  Neurologic: grossly nonfocal; Cranial nerves grossly wnl Psychologic: Normal mood and affect   Studies/Labs Reviewed:   March 27,8, 2024 ECG (independently read by me):  NSR at 79, no ecpy, normal intervals  Recent Labs:    Latest Ref Rng & Units 09/02/2021   10:06 AM 09/02/2021   10:02 AM 09/02/2021   10:01 AM  BMP  Sodium 135 - 145 mmol/L 143  146  140   Potassium 3.5 - 5.1 mmol/L 3.6  3.2  4.0         Latest Ref Rng & Units 10/11/2022  2:49 PM 08/09/2021   10:25 AM 08/05/2020   11:52 AM  Hepatic Function  Total Protein 6.0 - 8.5 g/dL 6.6  7.3  6.9   Albumin 3.9 - 4.9 g/dL 4.4     AST 0 - 40 IU/L 17  19  14    ALT 0 - 32 IU/L 21  17  12    Alk Phosphatase 44 - 121 IU/L 91     Total Bilirubin 0.0 - 1.2 mg/dL 0.3  0.7  0.8   Bilirubin, Direct 0.00 - 0.40 mg/dL <0.10          Latest Ref Rng & Units 09/02/2021   10:06 AM 09/02/2021   10:02 AM 09/02/2021   10:01 AM  CBC  Hemoglobin 12.0 - 15.0 g/dL 11.6  11.2  12.6   Hematocrit 36.0 - 46.0 % 34.0  33.0  37.0    Lab Results  Component Value Date   MCV 86.0 08/27/2021   MCV 87.1 08/09/2021   MCV 86.8 08/05/2020   Lab Results  Component Value Date   TSH 2.087 11/15/2021   Lab Results  Component Value Date   HGBA1C 5.3 08/09/2021     BNP    Component Value Date/Time   BNP 27.5 10/24/2018 1817    ProBNP No results found for: "PROBNP"   Lipid Panel     Component Value  Date/Time   CHOL 223 (H) 08/09/2021 1025   CHOL 190 12/11/2015 1137   TRIG 116 08/09/2021 1025   HDL 66 08/09/2021 1025   HDL 57 12/11/2015 1137   CHOLHDL 3.4 08/09/2021 1025   VLDL 17 06/20/2017 1122   LDLCALC 135 (H) 08/09/2021 1025   LABVLDL 13 12/11/2015 1137     RADIOLOGY: No results found.   Additional studies/ records that were reviewed today include:   I reviewed records of [facility], prior's CPAP titration studies of 2016 and 2021.  Catheterization data.   ASSESSMENT:    1. OSA on CPAP   2. Chronic diastolic heart failure ([place])   3. Moderate obesity   4. Depression, unspecified depression type     PLAN:  Ms. Kerry-Anne Famiglietti is a 62 year old African-American female who has a history of depression, obesity, and remotely had undergone cardiac catheterization in September 2022 which was unremarkable.  She has a history of obstructive sleep apnea reportedly initially diagnosed in 2016.  She has been on CPAP therapy since that time.  Apparently, she was reevaluated in 2021 and on CPAP titration study at that time it was recommended she stay on CPAP at 15 cm.  Apparently, she had been cared for by Dr. Elenore Rota.  It appears that she received a new ResMed AirSense 11 AutoSet unit on January 27, 2021 with [employer] as her DME company.  We were able to have [employer] link her machine to our office today to obtain a download.  Her initial download from onset of the new machine from February 15 through March 03, 2021 showed that she was meeting compliance with 100% use and averaging 5 hours and 45 minutes per night.  AHI was 1.5 and her pressure was set at a range of 11 to 15 cm.  Her 95th percentile pressure was 14.4 with maximum average pressure at 14.8.  Apparently over the years, her use of CPAP has significantly declined.  Her sleep hygiene is poor.  She stays up late.  Previously she had been working night shifts and had had multiple jobs.  However she has been retired  recently.  Her most recent download from February 27 through March 15, 2023 shows poor compliance at only 37% of usage days and usage greater than 4 hours at only 27%.  At a pressure range of 6 to 15 cm, AHI is 2.1.  Her 95th percentile pressure was 13.0 with maximum average pressure 13.7.  I had an extensive discussion with her today in the office.  I reviewed potential adverse cardiovascular consequences of untreated sleep apnea.  I discussed its ramifications with reference to blood pressure control, potential for nocturnal arrhythmias, increased risk for atrial fibrillation and in addition I discussed its negative effects on insulin resistance, increased inflammatory markers as well as nocturnal GERD.  Particularly with the presence of atherosclerosis I discussed potential nocturnal hypoxemia contributing to nocturnal ischemia both in the cardiac as well as cerebrovascular territory.  Presently, she is wondering about alternatives to CPAP therapy.  Her BMI is currently 34.8.  I discussed alternatives with customized appliance versus potential inspire.  However, presently I have recommended that we make changes to her current settings to see if she can improve use and treatment.  I will change her ramp start pressure from 4 up to 6.  I am changing her pressure settings from 6-15 to 10 to 18 cm of water.  I discussed the importance of weight loss and increased exercise.  I discussed optimal sleep duration at 7 and 9 hours and in particular to use CPAP for the nights entirety particularly since sleep apnea is typically most pronounced during REM sleep in the preponderance of REM sleep occurs in the second half of the night.  I will see her in the office in 6 months.  We did discuss potential future ENT evaluation to see if she is a candidate for inspire but prior to that we will most likely need a reevaluation.   Medication Adjustments/Labs and Tests Ordered: Current medicines are reviewed at length with the  patient today.  Concerns regarding medicines are outlined above.  Medication changes, Labs and Tests ordered today are listed in the Patient Instructions below. Patient Instructions  Medication Instructions:   NO CHANGES  *If you need a refill on your cardiac medications before your next appointment, please call your pharmacy*   Lab Work:  NOT NEEDED   Testing/Procedures:  NOT NEEDED  Follow-Up: At [facility], you and your health needs are our priority.  As part of our continuing mission to provide you with exceptional heart care, we have created designated Provider Care Teams.  These Care Teams include your primary Cardiologist (physician) and Advanced Practice Providers (APPs -  Physician Assistants and Nurse Practitioners) who all work together to provide you with the care you need, when you need it.     Your next appointment:    5 TO 6 month(s)  SLEEP   The format for your next appointment:   In Person  Provider:   Shelva Majestic, MD    Other Instructions    TRY TO DO 6 TO 7 HOURS OF SLEEP DAILY   USE YOUR C-PAP EVERY NIGHT    PLEASE CONTACT OFFICE IF YOU FIND YOUR ORIGINAL SLEEP STUDY INFORMATION - WANDA WADDELL  [id number]  Will change -- pressure changes to your C-PAP   ( [employer])    ramp 6  Pressure 10-18   Signed, Shelva Majestic, MD, [facility], ABSM Diplomate, American Board of Sleep Medicine  03/22/2023 12:19 PM    [place] [address], [address], [place], [place]  [address] Phone: [phone]    

## 2023-03-16 NOTE — Patient Instructions (Addendum)
Medication Instructions:   NO CHANGES  *If you need a refill on your cardiac medications before your next appointment, please call your pharmacy*   Lab Work:  NOT NEEDED   Testing/Procedures:  NOT NEEDED  Follow-Up: At [facility], you and your health needs are our priority.  As part of our continuing mission to provide you with exceptional heart care, we have created designated Provider Care Teams.  These Care Teams include your primary Cardiologist (physician) and Advanced Practice Providers (APPs -  Physician Assistants and Nurse Practitioners) who all work together to provide you with the care you need, when you need it.     Your next appointment:    5 TO 6 month(s)  SLEEP   The format for your next appointment:   In Person  Provider:   Shelva Majestic, MD    Other Instructions    TRY TO DO 6 TO 7 HOURS OF SLEEP DAILY   USE YOUR C-PAP EVERY NIGHT    PLEASE CONTACT OFFICE IF YOU FIND YOUR ORIGINAL SLEEP STUDY INFORMATION - WANDA WADDELL  [phone] [id number]  Will change -- pressure changes to your C-PAP   ( [employer])    ramp 6  Pressure 10-18

## 2023-03-22 ENCOUNTER — Encounter: Payer: Self-pay | Admitting: Cardiovascular Disease

## 2023-04-04 ENCOUNTER — Ambulatory Visit (INDEPENDENT_AMBULATORY_CARE_PROVIDER_SITE_OTHER): Payer: BC Managed Care – PPO | Admitting: Family Medicine

## 2023-04-04 ENCOUNTER — Other Ambulatory Visit (HOSPITAL_COMMUNITY)
Admission: RE | Admit: 2023-04-04 | Discharge: 2023-04-04 | Disposition: A | Discharge status: Home / Self Care | Payer: BC Managed Care – PPO | Source: Ambulatory Visit | Attending: Family Medicine | Admitting: Family Medicine

## 2023-04-04 VITALS — BP 114/68 | HR 95 | Temp 97.8°F | Resp 16 | Ht 65.0 in | Wt 206.9 lb

## 2023-04-04 DIAGNOSIS — N898 Other specified noninflammatory disorders of vagina: Secondary | ICD-10-CM | POA: Insufficient documentation

## 2023-04-04 NOTE — Progress Notes (Signed)
Patient ID: Teresa Gallagher, female    DOB: 1961/06/01, 62 y.o.   MRN: [id number]  PCP: Alba Cory, MD  Chief Complaint  Patient presents with   Vaginal Discharge    Tan/brown color for x5 days    Subjective:   Teresa Gallagher is a 62 y.o. female, presents to clinic with CC of the following:  HPI  Hx of BV previously Some scant light brown discharge in the past 4-5 d Did occur after recent sexual activity with new partner with condom and lubricant use  Just finished 0.50 and was supposed to start 1.0 dose shot  Wt Readings from Last 5 Encounters:  04/04/23 206 lb 14.4 oz (93.8 kg)  03/16/23 209 lb (94.8 kg)  02/20/23 210 lb (95.3 kg)  11/08/22 210 lb (95.3 kg)  10/17/22 209 lb 9.6 oz (95.1 kg)   BMI Readings from Last 5 Encounters:  04/04/23 34.43 kg/m  03/16/23 34.78 kg/m  02/20/23 34.95 kg/m  11/08/22 34.95 kg/m  10/17/22 34.88 kg/m   On semaglutide just started  Insurance recommended saxenda for weight - but its not covered either and expensive $1200/month       Patient Active Problem List   Diagnosis Date Noted   Encounter for screening colonoscopy 11/08/2022   Adenomatous polyp of descending colon 11/08/2022   Rectal polyp 11/08/2022   Chronic diastolic heart failure 06/10/2022   Breast lump on right side at 10 o'clock position 06/10/2022   Circadian rhythm sleep disorder, shift work type 03/23/2021   Obstructive sleep apnea syndrome 03/23/2021   Attention and concentration deficit 01/20/2020   Alcohol use disorder, mild, in early remission 10/29/2019   Old tear of meniscus of right knee 03/20/2019   SOB (shortness of breath) 08/29/2017   Episodic tension-type headache, not intractable 12/20/2016   History of blood transfusion 07/30/2015   GAD (generalized anxiety disorder) 07/30/2015   Chronic constipation 07/13/2015   Insomnia 07/13/2015   MDD (major depressive disorder), recurrent episode, mild (HCC) 07/13/2015   Fibromyalgia  syndrome 07/13/2015   Herpes simplex type 2 infection 07/13/2015   Morbid obesity (HCC) 07/13/2015   Sleep apnea with hypersomnolence 07/13/2015   Vitamin D deficiency 07/13/2015      Current Outpatient Medications:    albuterol (VENTOLIN HFA) 108 (90 Base) MCG/ACT inhaler, TAKE 2 PUFFS BY MOUTH EVERY 6 HOURS AS NEEDED FOR WHEEZE OR SHORTNESS OF BREATH, Disp: 18 each, Rfl: 1   AUVELITY 45-105 MG TBCR, Take by mouth., Disp: , Rfl:    baclofen (LIORESAL) 10 MG tablet, TAKE 1 TABLET BY MOUTH THREE TIMES A DAY AS NEEDED FOR MUSCLE SPASMS, Disp: 30 tablet, Rfl: 0   BD PEN NEEDLE MICRO U/F 32G X 6 MM MISC, USE AS DIRECTED DAILY AT 12 NOON, Disp: 100 each, Rfl: 1   CAPLYTA 10.5 MG CAPS, Take 1 capsule by mouth at bedtime., Disp: , Rfl:    CAPLYTA 42 MG capsule, Take 42 mg by mouth at bedtime., Disp: , Rfl:    furosemide (LASIX) 20 MG tablet, Take 1 tablet (20 mg total) by mouth as needed for fluid or edema., Disp: 90 tablet, Rfl: 0   Glucos-Chond-Hyal Ac-Ca Fructo (MOVE FREE JOINT HEALTH ADVANCE PO), Take 1 tablet by mouth daily., Disp: , Rfl:    Glycerin-Hypromellose-PEG 400 (ARTIFICIAL TEARS) 0.2-0.2-1 % SOLN, Place 1 drop into both eyes daily as needed (dry eyes)., Disp: , Rfl:    hydrOXYzine (VISTARIL) 25 MG capsule, Take 25 mg by mouth 3 (three)  times daily., Disp: , Rfl:    LORazepam (ATIVAN) 1 MG tablet, Take 0.5-1 mg by mouth daily as needed for anxiety., Disp: , Rfl:    meloxicam (MOBIC) 15 MG tablet, Take 15 mg by mouth daily., Disp: , Rfl:    Menthol, Topical Analgesic, (BIOFREEZE) 4 % GEL, Apply 1 application topically daily as needed (pain)., Disp: , Rfl:    modafinil (PROVIGIL) 200 MG tablet, Take 1 tablet (200 mg total) by mouth daily., Disp: 90 tablet, Rfl: 0   Multiple Vitamin (MULTIVITAMIN) tablet, Take 1 tablet by mouth daily., Disp: , Rfl:    potassium chloride SA (KLOR-CON M20) 20 MEQ tablet, Take 1 tablet (20 mEq total) by mouth daily as needed. When you take lasix, Disp: 90  tablet, Rfl: 0   pregabalin (LYRICA) 50 MG capsule, Take 50 mg by mouth 3 (three) times daily., Disp: , Rfl:    [START ON 04/19/2023] Semaglutide-Weight Management 1 MG/0.5ML SOAJ, Inject 1 mg into the skin once a week for 28 days., Disp: 2 mL, Rfl: 0   [START ON 05/18/2023] Semaglutide-Weight Management 1.7 MG/0.75ML SOAJ, Inject 1.7 mg into the skin once a week for 28 days., Disp: 3 mL, Rfl: 0   terbinafine (LAMISIL) 250 MG tablet, Take 1 tablet (250 mg total) by mouth daily., Disp: 90 tablet, Rfl: 0   traZODone (DESYREL) 50 MG tablet, Take 50-150 mg by mouth at bedtime as needed., Disp: , Rfl:    triamcinolone acetonide (KENALOG-40) 40 MG/ML injection, SMARTSIG:2 Milliliter(s) IM, Disp: , Rfl:    valACYclovir (VALTREX) 500 MG tablet, Take 1 tablet (500 mg total) by mouth daily. And twice daily for outbreaks, Disp: 115 tablet, Rfl: 1   zinc gluconate 50 MG tablet, Take 50 mg by mouth daily., Disp: , Rfl:    No Known Allergies   Social History   Tobacco Use   Smoking status: Never   Smokeless tobacco: Never  Vaping Use   Vaping Use: Never used  Substance Use Topics   Alcohol use: Not Currently   Drug use: No      Chart Review Today: I personally reviewed active problem list, medication list, allergies, family history, social history, health maintenance, notes from last encounter, lab results, imaging with the patient/caregiver today.   Review of Systems  Constitutional: Negative.   HENT: Negative.    Eyes: Negative.   Respiratory: Negative.    Cardiovascular: Negative.   Gastrointestinal: Negative.   Endocrine: Negative.   Genitourinary: Negative.   Musculoskeletal: Negative.   Skin: Negative.   Allergic/Immunologic: Negative.   Neurological: Negative.   Hematological: Negative.   Psychiatric/Behavioral: Negative.    All other systems reviewed and are negative.      Objective:   Vitals:   04/04/23 1036  BP: 114/68  Pulse: 95  Resp: 16  Temp: 97.8 F (36.6 C)   TempSrc: Oral  SpO2: 96%  Weight: 206 lb 14.4 oz (93.8 kg)  Height: 5\' 5"  (1.651 m)    Body mass index is 34.43 kg/m.  Physical Exam Vitals and nursing note reviewed.  Constitutional:      Appearance: She is well-developed.  HENT:     Head: Normocephalic and atraumatic.     Nose: Nose normal.  Eyes:     General:        Right eye: No discharge.        Left eye: No discharge.     Conjunctiva/sclera: Conjunctivae normal.  Neck:     Trachea: No tracheal deviation.  Cardiovascular:     Rate and Rhythm: Normal rate and regular rhythm.  Pulmonary:     Effort: Pulmonary effort is normal. No respiratory distress.     Breath sounds: No stridor.  Abdominal:     General: Bowel sounds are normal. There is no distension.     Palpations: Abdomen is soft.     Tenderness: There is no abdominal tenderness.  Musculoskeletal:        General: Normal range of motion.  Skin:    General: Skin is warm and dry.     Findings: No rash.  Neurological:     Mental Status: She is alert.     Motor: No abnormal muscle tone.     Coordination: Coordination normal.  Psychiatric:        Behavior: Behavior normal.      Results for orders placed or performed during the hospital encounter of 11/08/22  Surgical pathology  Result Value Ref Range   SURGICAL PATHOLOGY      SURGICAL PATHOLOGY CASE: [id number] PATIENT: Ashok Pall Surgical Pathology Report     Specimen Submitted: A. Colon polyp, des; cold snare B. Rectum polyp x1; cold snare  Clinical History: Screening colonscopy. Colon polyp, rectal polyp, diverticulosis.      DIAGNOSIS: A.  COLON, DESCENDING, POLYP; COLD SNARE BIOPSIES - MULTIPLE FRAGMENTS OF TUBULAR ADENOMA. - NO EVIDENCE OF HIGH-GRADE DYSPLASIA OR MALIGNANCY.  B.  RECTUM, POLYP; COLD SNARE BIOPSIES: - OVERALL FEATURES FAVOR SESSILE SERRATED POLYP FRAGMENTS  WITH SUPERIMPOSED PROLAPSE CHANGE. - NO EVIDENCE OF HIGH-GRADE DYSPLASIA OR MALIGNANCY.   GROSS  DESCRIPTION: A. Labeled: Cold snare descending colon polyp Received: Formalin Collection time: 10:04 AM on 11/08/2022 Placed into formalin time: 10:04 AM on 11/08/2022 Tissue fragment(s): 4 Size: Aggregate, 1.9 x 0.8 x 0.2 cm Description: Received are fragments of tan-pink soft tissue.  The largest fragment has a resection margin which is inked  green.  This fragment is serially sectioned. Entirely submitted in cassettes 1-2 with the serially sectioned fragment in cassette 1 and the remaining fragments in cassette 2.  B. Labeled: Cold snare rectal polyp x 1 Received: Formalin Collection time: 10:42 AM on 11/08/2022 Placed into formalin time: 10:42 AM on 11/08/2022 Tissue fragment(s): Multiple Size: Aggregate, 1 x 0.5 x 0.4 cm Description: Received is a single fragment of tan-pink soft tissue admixed with intestinal debris.  The ratio of soft tissue to intestinal debris is 70: 30.  The soft tissue fragment has a resection margin which is inked black.  This fragment is bisected. Entirely submitted in 1 cassette.  RB 11/08/2022  Final Diagnosis performed by Alcario Drought, MD.   Electronically signed 11/09/2022 12:51:21PM The electronic signature indicates that the named Attending Pathologist has evaluated the specimen Technical component performed at [facility], [address], [place], [place] [address] Lab: [id number] [phone] Dir: Jolene Schimke, MD, MMM  Professional component performed at [facility], [facility], [address] [place], [place], [place] [address] Lab: [phone] Dir: Beryle Quant, MD        Assessment & Plan:     ICD-10-CM   1. Vaginal discharge  N89.8 Cervicovaginal ancillary only    Tx pending results Pt well appearing, scant discharge, no abd pain/fever, hx of BV New partner but she used condoms   she is not going to get her meds from PCP covered any more for weight loss - wants to know about other places to get it - reviewed drug websites and  other pharmacies she may call to  inquire about cost   Danelle Berry, PA-C 04/04/23 10:41 AM

## 2023-04-11 LAB — CERVICOVAGINAL ANCILLARY ONLY
Bacterial Vaginitis (gardnerella): NEGATIVE
Candida Glabrata: NEGATIVE
Candida Vaginitis: NEGATIVE
Chlamydia: NEGATIVE
Comment: NEGATIVE
Comment: NEGATIVE
Comment: NEGATIVE
Comment: NEGATIVE
Comment: NEGATIVE
Comment: NORMAL
Neisseria Gonorrhea: NEGATIVE
Trichomonas: NEGATIVE

## 2023-04-20 ENCOUNTER — Emergency Department
Admission: EM | Admit: 2023-04-20 | Discharge: 2023-04-20 | Disposition: A | Discharge status: Home / Self Care | Payer: BC Managed Care – PPO | Attending: Emergency Medicine | Admitting: Emergency Medicine

## 2023-04-20 ENCOUNTER — Encounter: Payer: Self-pay | Admitting: *Deleted

## 2023-04-20 ENCOUNTER — Other Ambulatory Visit: Payer: Self-pay

## 2023-04-20 ENCOUNTER — Emergency Department: Payer: BC Managed Care – PPO

## 2023-04-20 DIAGNOSIS — K5732 Diverticulitis of large intestine without perforation or abscess without bleeding: Secondary | ICD-10-CM | POA: Insufficient documentation

## 2023-04-20 DIAGNOSIS — D72829 Elevated white blood cell count, unspecified: Secondary | ICD-10-CM | POA: Diagnosis not present

## 2023-04-20 DIAGNOSIS — K5792 Diverticulitis of intestine, part unspecified, without perforation or abscess without bleeding: Secondary | ICD-10-CM

## 2023-04-20 DIAGNOSIS — R1084 Generalized abdominal pain: Secondary | ICD-10-CM | POA: Diagnosis present

## 2023-04-20 LAB — URINALYSIS, ROUTINE W REFLEX MICROSCOPIC
Bilirubin Urine: NEGATIVE
Glucose, UA: NEGATIVE mg/dL
Hgb urine dipstick: NEGATIVE
Ketones, ur: NEGATIVE mg/dL
Leukocytes,Ua: NEGATIVE
Nitrite: NEGATIVE
Protein, ur: NEGATIVE mg/dL
Specific Gravity, Urine: 1.018 (ref 1.005–1.030)
pH: 6 (ref 5.0–8.0)

## 2023-04-20 LAB — COMPREHENSIVE METABOLIC PANEL
ALT: 29 U/L (ref 0–44)
AST: 21 U/L (ref 15–41)
Albumin: 4 g/dL (ref 3.5–5.0)
Alkaline Phosphatase: 64 U/L (ref 38–126)
Anion gap: 9 (ref 5–15)
BUN: 10 mg/dL (ref 8–23)
CO2: 26 mmol/L (ref 22–32)
Calcium: 8.9 mg/dL (ref 8.9–10.3)
Chloride: 100 mmol/L (ref 98–111)
Creatinine, Ser: 0.85 mg/dL (ref 0.44–1.00)
GFR, Estimated: >60 mL/min (ref >60)
Glucose, Bld: 131 mg/dL — ABNORMAL HIGH (ref 70–99)
Potassium: 4.1 mmol/L (ref 3.5–5.1)
Sodium: 135 mmol/L (ref 135–145)
Total Bilirubin: 1.2 mg/dL (ref 0.3–1.2)
Total Protein: 7.3 g/dL (ref 6.5–8.1)

## 2023-04-20 LAB — CBC
HCT: 41.5 % (ref 36.0–46.0)
Hemoglobin: 14.7 g/dL (ref 12.0–15.0)
MCH: 29.7 pg (ref 26.0–34.0)
MCHC: 35.4 g/dL (ref 30.0–36.0)
MCV: 83.8 fL (ref 80.0–100.0)
Platelets: 293 10*3/uL (ref 150–400)
RBC: 4.95 MIL/uL (ref 3.87–5.11)
RDW: 13.5 % (ref 11.5–15.5)
WBC: 13.2 10*3/uL — ABNORMAL HIGH (ref 4.0–10.5)
nRBC: 0 % (ref 0.0–0.2)

## 2023-04-20 LAB — LIPASE, BLOOD: Lipase: 37 U/L (ref 11–51)

## 2023-04-20 MED ORDER — SULFAMETHOXAZOLE-TRIMETHOPRIM 800-160 MG PO TABS
1.0000 | ORAL_TABLET | Freq: Once | ORAL | Status: AC
  Administered 2023-04-20: 1 via ORAL
  Filled 2023-04-20: qty 1

## 2023-04-20 MED ORDER — ONDANSETRON 4 MG PO TBDP: 4.0000 mg | ORAL_TABLET | Freq: Three times a day (TID) | ORAL | 0 refills | Status: DC | PRN

## 2023-04-20 MED ORDER — SULFAMETHOXAZOLE-TRIMETHOPRIM 800-160 MG PO TABS: 1.0000 | ORAL_TABLET | Freq: Two times a day (BID) | ORAL | 0 refills | Status: DC

## 2023-04-20 MED ORDER — ONDANSETRON 4 MG PO TBDP
4.0000 mg | ORAL_TABLET | Freq: Once | ORAL | Status: AC
  Administered 2023-04-20: 4 mg via ORAL
  Filled 2023-04-20: qty 1

## 2023-04-20 MED ORDER — IOHEXOL 300 MG/ML  SOLN
100.0000 mL | Freq: Once | INTRAMUSCULAR | Status: AC | PRN
  Administered 2023-04-20: 100 mL via INTRAVENOUS

## 2023-04-20 MED ORDER — METRONIDAZOLE 500 MG PO TABS
500.0000 mg | ORAL_TABLET | Freq: Once | ORAL | Status: AC
  Administered 2023-04-20: 500 mg via ORAL
  Filled 2023-04-20: qty 1

## 2023-04-20 MED ORDER — METRONIDAZOLE 500 MG PO TABS: 500.0000 mg | ORAL_TABLET | Freq: Three times a day (TID) | ORAL | 0 refills | Status: AC

## 2023-04-20 NOTE — ED Triage Notes (Signed)
Pt has abd pain for 1 week.  Sx worse past 24 hours.   Pt reports cramping and gas.  No v/d.  Hx diverticulitis.  Pt alert  speech clear.

## 2023-04-20 NOTE — ED Notes (Signed)
See triage note. Pt states having abdominal pain for one week. Also on a cruise last week and complains of right ear pain with dizziness states believes fluid got into ear and cleared out a bit but still not drained completely.

## 2023-04-20 NOTE — ED Provider Notes (Signed)
[facility] Emergency Department Provider Note     Event Date/Time   First MD Initiated Contact with Patient 04/20/23 1925     (approximate)   History   Abdominal Pain   HPI  Teresa Gallagher is a 62 y.o. female with history of chronic constipation, diverticulosis, and obesity, presents to the ED for evaluation of 1 week of intermittent generalized abdominal discomfort.  Patient reports pain from the upper left quadrant to the lower left quadrant with associated nausea.  Denies any bloody stools or hematemesis.   Physical Exam   Triage Vital Signs: ED Triage Vitals  Enc Vitals Group     BP 04/20/23 1828 (!) 150/83     Pulse Rate 04/20/23 1828 (!) 114     Resp 04/20/23 1828 18     Temp 04/20/23 1828 98.6 F (37 C)     Temp Source 04/20/23 1828 Oral     SpO2 04/20/23 1828 96 %     Weight 04/20/23 1831 204 lb (92.5 kg)     Height 04/20/23 1831 5\' 5"  (1.651 m)     Head Circumference --      Peak Flow --      Pain Score 04/20/23 1831 7     Pain Loc --      Pain Edu? --      Excl. in GC? --     Most recent vital signs: Vitals:   04/20/23 1828  BP: (!) 150/83  Pulse: (!) 114  Resp: 18  Temp: 98.6 F (37 C)  SpO2: 96%    General Awake, no distress. NAD CV:  Good peripheral perfusion.  RESP:  Normal effort.  ABD:  No distention.  Soft and mildly tender.  No rebound, guarding, or rigidity noted.  Normoactive bowel sounds x 4.   ED Results / Procedures / Treatments   Labs (all labs ordered are listed, but only abnormal results are displayed) Labs Reviewed  COMPREHENSIVE METABOLIC PANEL - Abnormal; Notable for the following components:      Result Value   Glucose, Bld 131 (*)    All other components within normal limits  CBC - Abnormal; Notable for the following components:   WBC 13.2 (*)    All other components within normal limits  URINALYSIS, ROUTINE W REFLEX MICROSCOPIC - Abnormal; Notable for the following components:    Color, Urine YELLOW (*)    APPearance CLEAR (*)    All other components within normal limits  LIPASE, BLOOD     EKG   RADIOLOGY  I personally viewed and evaluated these images as part of my medical decision making, as well as reviewing the written report by the radiologist.  ED Provider Interpretation: Acute diverticulitis without evidence of rupture or abscess  CT ABDOMEN PELVIS W CONTRAST  Result Date: 04/20/2023 CLINICAL DATA:  Abdomen pain EXAM: CT ABDOMEN AND PELVIS WITH CONTRAST TECHNIQUE: Multidetector CT imaging of the abdomen and pelvis was performed using the standard protocol following bolus administration of intravenous contrast. RADIATION DOSE REDUCTION: This exam was performed according to the departmental dose-optimization program which includes automated exposure control, adjustment of the mA and/or kV according to patient size and/or use of iterative reconstruction technique. CONTRAST:  OMNIPAQUE IOHEXOL 300 MG/ML  SOLN COMPARISON:  CT 12/11/2008 FINDINGS: Lower chest: No acute abnormality. Hepatobiliary: No focal liver abnormality is seen. No gallstones, gallbladder wall thickening, or biliary dilatation. Pancreas: Unremarkable. No pancreatic ductal dilatation or surrounding inflammatory changes. Spleen: Normal in  size without focal abnormality. Adrenals/Urinary Tract: Adrenal glands are unremarkable. Kidneys are normal, without renal calculi, focal lesion, or hydronephrosis. Bladder is unremarkable. Stomach/Bowel: Stomach nonenlarged. No dilated small bowel. Diffuse diverticular disease of the colon. Marked wall thickening and pericolonic inflammatory changes at the descending colon consistent with diverticulitis. No perforation or abscess. Vascular/Lymphatic: No significant vascular findings are present. No enlarged abdominal or pelvic lymph nodes. Reproductive: Status post hysterectomy. No adnexal masses. Other: No free air. Small volume free fluid in the pelvis. Fat  containing umbilical hernia. Musculoskeletal: No acute or suspicious osseous abnormality IMPRESSION: 1. Findings consistent with acute diverticulitis of the descending colon. No perforation or abscess. 2. Small volume free fluid in the pelvis. Electronically Signed   By: Jasmine Pang M.D.   On: 04/20/2023 21:28     PROCEDURES:  Critical Care performed: No  Procedures   MEDICATIONS ORDERED IN ED: Medications  sulfamethoxazole-trimethoprim (BACTRIM DS) 800-160 MG per tablet 1 tablet (has no administration in time range)  metroNIDAZOLE (FLAGYL) tablet 500 mg (has no administration in time range)  ondansetron (ZOFRAN-ODT) disintegrating tablet 4 mg (has no administration in time range)  iohexol (OMNIPAQUE) 300 MG/ML solution 100 mL (100 mLs Intravenous Contrast Given 04/20/23 2114)     IMPRESSION / MDM / ASSESSMENT AND PLAN / ED COURSE  I reviewed the triage vital signs and the nursing notes.                              Differential diagnosis includes, but is not limited to, acute appendicitis, diverticulitis, urinary tract infection/pyelonephritis, bowel obstruction, colitis, renal colic, gastroenteritis, hernia, etc.   Patient's presentation is most consistent with acute complicated illness / injury requiring diagnostic workup.  Patient with generalized abdominal pain to the left upper and lower quadrant, concerning for intra-abdominal process.  Labs overall reassuring with a mild leukocytosis noted.  No critical anemia appreciated, no electrolyte abnormalities noted.  Patient CT scan did confirm diverticulitis of the colon without evidence of abscess or rupture.  Patient otherwise is hemodynamically stable without intractable vomiting or dehydration.  Patient is appropriate for outpatient management.  P.o. antibiotics.  Patient's diagnosis is consistent with particularly cellulitis. Patient will be discharged home with prescriptions for Zofran, Bactrim, and metronidazole. Patient is to  follow up with her primary provider as needed or otherwise directed. Patient is given ED precautions to return to the ED for any worsening or new symptoms.   FINAL CLINICAL IMPRESSION(S) / ED DIAGNOSES   Final diagnoses:  Diverticulitis     Rx / DC Orders   ED Discharge Orders          Ordered    sulfamethoxazole-trimethoprim (BACTRIM DS) 800-160 MG tablet  2 times daily        04/20/23 2141    metroNIDAZOLE (FLAGYL) 500 MG tablet  3 times daily        04/20/23 2141    ondansetron (ZOFRAN-ODT) 4 MG disintegrating tablet  Every 8 hours PRN        04/20/23 2141             Note:  This document was prepared using Dragon voice recognition software and may include unintentional dictation errors.    Lissa Hoard, PA-C 04/20/23 2145    Chesley Noon, MD 04/20/23 2218

## 2023-04-20 NOTE — Discharge Instructions (Addendum)
Your exam, labs, and CT are overall reassuring, but you do have evidence of an acute diverticulitis without evidence of rupture or abscess.  Take the prescription antibiotics as directed.  Take the nausea medicine as needed.  You should continue with the clear liquid diet and advance as tolerated.  Follow-up with primary provider for ongoing concerns.  Return to the ED if needed.

## 2023-04-21 ENCOUNTER — Other Ambulatory Visit: Payer: Self-pay | Admitting: Family Medicine

## 2023-04-21 ENCOUNTER — Other Ambulatory Visit: Payer: Self-pay | Admitting: Podiatry

## 2023-04-21 NOTE — Telephone Encounter (Signed)
Next dose change 04/19/23

## 2023-05-03 ENCOUNTER — Ambulatory Visit: Payer: BC Managed Care – PPO | Admitting: Family Medicine

## 2023-05-03 ENCOUNTER — Telehealth: Payer: BC Managed Care – PPO | Admitting: Family Medicine

## 2023-05-23 ENCOUNTER — Other Ambulatory Visit: Payer: Self-pay | Admitting: Family Medicine

## 2023-05-23 DIAGNOSIS — I5032 Chronic diastolic (congestive) heart failure: Secondary | ICD-10-CM

## 2023-05-31 NOTE — Progress Notes (Deleted)
Name: Teresa Gallagher   MRN: [id number]    DOB: 10-08-1961   Date:05/31/2023       Progress Note  Subjective  Chief Complaint  Follow Up  HPI  Sleep Apnea: she was diagnosed with OSA in 2016. Reviewed the last  study with her done in 2021 , she states she had an appointment with Dr. Earl Gala - sleep medicine  still take modafinil to help with daytime somnolence, she still has medication of provigil at home    SOB/chronic CHF diastolic : seen by pulmonologist and cardiologist, wearing CPAP and using lasix and SOB has improved.  She has some lower extremity edema intermittently , she has mild orthopnea . She had a cath done by Dr. Gala Romney and had cardiac cath 09/22 normal EKG Symptoms of SOB was getting worse but improved with lasix    GAD/Major  recurrent depression: she saw Dr. Elna Breslow for the first time Nov 10 th, 2020. But switched to Dr. Evelene Croon since Nov 2021 She is on longer term disability since 2023. Dr. Evelene Croon she has been compliant with her medications    Morbid obesity  BMI is above 35 with co-morbidities such as   OSA, OA and dyslipidemia. She started on Saxenda March 2023 at a weight of 215 lbs, she is out of medication due to [employer] , weight was  down to 209.5 lbs in October, we sent rx for [facility] back in October but she is now on the 3rd 0.25 mg dose - due to [place] shortage She has noticed some fatigue. Denies nausea, change in bowel movements. Her weight at home had gone up to 219 lbs when off all medications even though she was doing intermittent fasting weight had only gone down to 216 lbs and  today is 212 lbs at home   Dyslipidemia: not on medication discussed healthy diet  The 10-year ASCVD risk score (Arnett DK, et al., 2019) is: 5.7%   Values used to calculate the score:     Age: 62 years     Sex: Female     Is Non-Hispanic African American: Yes     Diabetic: No     Tobacco smoker: No     Systolic Blood Pressure: 123 mmHg     Is BP treated: No     HDL  Cholesterol: 66 mg/dL     Total Cholesterol: 223 mg/dL    Back pain: used to be intermittent but got more intense and with a lot of spasms and was seen by Dr. 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   Obstructive sleep apnea syndrome 03/23/2021   Attention and concentration deficit 01/20/2020   Alcohol use disorder, mild, in early remission 10/29/2019   Old tear of meniscus of right knee 03/20/2019   SOB (shortness of breath) 08/29/2017   Episodic tension-type headache, not intractable 12/20/2016   History of blood transfusion 07/30/2015   GAD (generalized anxiety disorder) 07/30/2015   Chronic constipation 07/13/2015   Insomnia 07/13/2015   MDD (major depressive disorder), recurrent episode,  mild (HCC) 07/13/2015   Fibromyalgia syndrome 07/13/2015   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 Earnestine Leys at [facility]    CARDIAC CATHETERIZATION     CARPAL TUNNEL RELEASE Left 12/19/2010   CESAREAN SECTION     COLONOSCOPY WITH PROPOFOL N/A 11/08/2022   Procedure: COLONOSCOPY WITH PROPOFOL;  Surgeon: Toney Reil, MD;  Location: [facility] ENDOSCOPY;  Service: Gastroenterology;  Laterality: N/A;   LAPAROSCOPIC HYSTERECTOMY     followed by 3 cuff repairs   LIPOMA EXCISION   12/19/2010   REFRACTIVE SURGERY Bilateral    RIGHT/LEFT HEART CATH AND CORONARY ANGIOGRAPHY N/A 09/02/2021   Procedure: RIGHT/LEFT HEART CATH AND CORONARY ANGIOGRAPHY;  Surgeon: Dolores Patty, MD;  Location: [facility] INVASIVE CV LAB;  Service: Cardiovascular;  Laterality: N/A;   TONSILLECTOMY      Family History  Problem Relation Age of Onset   Heart attack Father    Alcohol abuse Father    COPD Sister        end stages   Bipolar disorder Brother    Depression Brother    Bipolar disorder Brother    Depression Brother    Alcohol abuse Brother    Heart attack Paternal Uncle    Breast cancer Paternal Aunt    Breast cancer Maternal Aunt     Social History   Tobacco Use   Smoking status: Never   Smokeless tobacco: Never  Substance Use Topics   Alcohol use: Not Currently     Current Outpatient Medications:    albuterol (VENTOLIN HFA) 108 (90 Base) MCG/ACT inhaler, TAKE 2 PUFFS BY MOUTH EVERY 6 HOURS AS NEEDED FOR WHEEZE OR SHORTNESS OF BREATH, Disp: 18 each, Rfl: 1   AUVELITY 45-105 MG TBCR, Take by mouth., Disp: , Rfl:    baclofen (LIORESAL) 10 MG tablet, TAKE 1 TABLET BY MOUTH THREE TIMES A DAY AS NEEDED FOR MUSCLE SPASMS, Disp: 30 tablet, Rfl: 0   BD PEN NEEDLE MICRO U/F 32G X 6 MM MISC, USE AS DIRECTED DAILY AT 12 NOON, Disp: 100 each, Rfl: 1   CAPLYTA 10.5 MG CAPS, Take 1 capsule by mouth at bedtime., Disp: , Rfl:    CAPLYTA 42 MG capsule, Take 42 mg by mouth at bedtime., Disp: , Rfl:    furosemide (LASIX) 20 MG tablet, TAKE 1 TABLET (20 MG TOTAL) BY MOUTH AS NEEDED FOR FLUID OR EDEMA., Disp: 90 tablet, Rfl: 0   Glucos-Chond-Hyal Ac-Ca Fructo (MOVE FREE JOINT HEALTH ADVANCE PO), Take 1 tablet by mouth daily., Disp: , Rfl:    Glycerin-Hypromellose-PEG 400 (ARTIFICIAL TEARS) 0.2-0.2-1 % SOLN, Place 1 drop into both eyes daily as needed (dry eyes)., Disp: , Rfl:    hydrOXYzine (VISTARIL) 25 MG capsule, Take 25 mg by mouth 3 (three) times daily., Disp: , Rfl:    KLOR-CON M20 20  MEQ tablet, TAKE 1 TABLET (20 MEQ TOTAL) BY MOUTH DAILY AS NEEDED. WHEN YOU TAKE LASIX, Disp: 90 tablet, Rfl: 0   LORazepam (ATIVAN) 1 MG tablet, Take 0.5-1 mg by mouth daily as needed for anxiety., Disp: , Rfl:    meloxicam (MOBIC) 15 MG tablet, Take 15 mg by mouth daily., Disp: , Rfl:    Menthol, Topical Analgesic, (BIOFREEZE) 4 % GEL, Apply 1 application  topically daily as needed (pain)., Disp: , Rfl:    modafinil (PROVIGIL) 200 MG tablet, Take 1 tablet (200 mg total) by mouth daily., Disp: 90 tablet, Rfl: 0   Multiple Vitamin (MULTIVITAMIN) tablet, Take 1 tablet by mouth daily., Disp: , Rfl:    ondansetron (ZOFRAN-ODT) 4 MG disintegrating tablet, Take 1 tablet (4 mg total) by mouth every 8 (eight) hours as needed for nausea or vomiting., Disp: 15 tablet, Rfl: 0   pregabalin (LYRICA) 50 MG capsule, Take 50 mg by mouth 3 (three) times daily., Disp: , Rfl:    Semaglutide-Weight Management 1.7 MG/0.75ML SOAJ, Inject 1.7 mg into the skin once a week for 28 days., Disp: 3 mL, Rfl: 0   sulfamethoxazole-trimethoprim (BACTRIM DS) 800-160 MG tablet, Take 1 tablet by mouth 2 (two) times daily., Disp: 20 tablet, Rfl: 0   terbinafine (LAMISIL) 250 MG tablet, Take 1 tablet (250 mg total) by mouth daily., Disp: 90 tablet, Rfl: 0   traZODone (DESYREL) 50 MG tablet, Take 50-150 mg by mouth at bedtime as needed., Disp: , Rfl:    triamcinolone acetonide (KENALOG-40) 40 MG/ML injection, SMARTSIG:2 Milliliter(s) IM, Disp: , Rfl:    valACYclovir (VALTREX) 500 MG tablet, Take 1 tablet (500 mg total) by mouth daily. And twice daily for outbreaks, Disp: 115 tablet, Rfl: 1   zinc gluconate 50 MG tablet, Take 50 mg by mouth daily., Disp: , Rfl:   No Known Allergies  I personally reviewed active problem list, medication list, allergies, family history, social history, health maintenance with the patient/caregiver today.   ROS  ***  Objective  There were no vitals filed for this visit.  There is no height or  weight on file to calculate BMI.  Physical Exam ***  Recent Results (from the past 2160 hour(s))  Cervicovaginal ancillary only     Status: None   Collection Time: 04/04/23 10:43 AM  Result Value Ref Range   Neisseria Gonorrhea Negative    Chlamydia Negative    Trichomonas Negative    Bacterial Vaginitis (gardnerella) Negative    Candida Vaginitis Negative    Candida Glabrata Negative    Comment      Normal Reference Range Bacterial Vaginosis - Negative   Comment Normal Reference Ranger Chlamydia - Negative    Comment      Normal Reference Range Neisseria Gonorrhea - Negative   Comment Normal Reference Range Candida Species - Negative    Comment Normal Reference Range Candida Galbrata - Negative    Comment Normal Reference Range Trichomonas - Negative   Lipase, blood     Status: None   Collection Time: 04/20/23  6:32 PM  Result Value Ref Range   Lipase 37 11 - 51 U/L    Comment: Performed at [facility], [address] [place], [place] [address]  Comprehensive metabolic panel     Status: Abnormal   Collection Time: 04/20/23  6:32 PM  Result Value Ref Range   Sodium 135 135 - 145 mmol/L   Potassium 4.1 3.5 - 5.1 mmol/L   Chloride 100 98 - 111 mmol/L   CO2 26 22 - 32 mmol/L   Glucose, Bld 131 (H) 70 - 99 mg/dL    Comment: Glucose reference range applies only to samples taken after fasting for at least 8 hours.   BUN 10 8 - 23 mg/dL   Creatinine, Ser 6.04 0.44 - 1.00 mg/dL   Calcium 8.9 8.9 - 54.0 mg/dL   Total Protein 7.3 6.5 - 8.1 g/dL  Albumin 4.0 3.5 - 5.0 g/dL   AST 21 15 - 41 U/L   ALT 29 0 - 44 U/L   Alkaline Phosphatase 64 38 - 126 U/L   Total Bilirubin 1.2 0.3 - 1.2 mg/dL   GFR, Estimated >40 >98 mL/min    Comment: (NOTE) Calculated using the CKD-EPI Creatinine Equation (2021)    Anion gap 9 5 - 15    Comment: Performed at [facility], [address] [place], [place] [address]  CBC     Status: Abnormal   Collection Time: 04/20/23   6:32 PM  Result Value Ref Range   WBC 13.2 (H) 4.0 - 10.5 K/uL   RBC 4.95 3.87 - 5.11 MIL/uL   Hemoglobin 14.7 12.0 - 15.0 g/dL   HCT 78.2 95.6 - 21.3 %   MCV 83.8 80.0 - 100.0 fL   MCH 29.7 26.0 - 34.0 pg   MCHC 35.4 30.0 - 36.0 g/dL   RDW 08.6 57.8 - 46.9 %   Platelets 293 150 - 400 K/uL   nRBC 0.0 0.0 - 0.2 %    Comment: Performed at [facility], [address] [place], [place] [address]  Urinalysis, Routine w reflex microscopic -Urine, Clean Catch     Status: Abnormal   Collection Time: 04/20/23  6:33 PM  Result Value Ref Range   Color, Urine YELLOW (A) YELLOW   APPearance CLEAR (A) CLEAR   Specific Gravity, Urine 1.018 1.005 - 1.030   pH 6.0 5.0 - 8.0   Glucose, UA NEGATIVE NEGATIVE mg/dL   Hgb urine dipstick NEGATIVE NEGATIVE   Bilirubin Urine NEGATIVE NEGATIVE   Ketones, ur NEGATIVE NEGATIVE mg/dL   Protein, ur NEGATIVE NEGATIVE mg/dL   Nitrite NEGATIVE NEGATIVE   Leukocytes,Ua NEGATIVE NEGATIVE    Comment: Performed at [facility], [address] [place], [place] [address]    PHQ2/9:    04/04/2023   10:36 AM 02/20/2023    9:29 AM 10/17/2022    3:38 PM 06/10/2022   11:29 AM 02/21/2022    1:45 PM  Depression screen PHQ 2/9  Decreased Interest 3 3 3 3 3   Down, Depressed, Hopeless 2 3 3 2 3   PHQ - 2 Score 5 6 6 5 6   Altered sleeping 2 1 3 3 2   Tired, decreased energy 2 3 3 3 3   Change in appetite 0 0 3 0 2  Feeling bad or failure about yourself  0 3 2 2 2   Trouble concentrating 0 3 3 3 2   Moving slowly or fidgety/restless 0 0 2 0 0  Suicidal thoughts 0 0 0 0 0  PHQ-9 Score 9 16 22 16 17   Difficult doing work/chores Somewhat difficult  Extremely dIfficult      phq 9 is {gen pos [id number]}   Fall Risk:    04/04/2023   10:36 AM 02/20/2023    7:55 AM 10/17/2022    3:30 PM 06/10/2022   11:21 AM 02/21/2022    1:37 PM  Fall Risk   Falls in the past year? 0 0 1 0 0  Number falls in past yr: 0 0 1 0 0  Injury with Fall? 0 0 1 0 0  Risk for  fall due to : No Fall Risks No Fall Risks History of fall(s) No Fall Risks No Fall Risks  Follow up Falls prevention discussed;Education provided;Falls evaluation completed Falls prevention discussed Falls prevention discussed;Education provided;Falls evaluation completed Falls prevention discussed Falls prevention discussed  Functional Status Survey:      Assessment & Plan  *** There are no diagnoses linked to this encounter.

## 2023-06-01 ENCOUNTER — Ambulatory Visit: Payer: BC Managed Care – PPO | Admitting: Family Medicine

## 2023-06-21 ENCOUNTER — Other Ambulatory Visit: Payer: Self-pay | Admitting: Family Medicine

## 2023-06-21 DIAGNOSIS — B009 Herpesviral infection, unspecified: Secondary | ICD-10-CM

## 2023-06-28 ENCOUNTER — Ambulatory Visit: Payer: Self-pay | Admitting: *Deleted

## 2023-06-28 NOTE — Telephone Encounter (Signed)
tested positive for covid   Pt called in tested positive for covid today, has Coughing sore throat fever 100.2. She is asking for med. [id number] Sheen, [place] [address] Phone: [phone] Fax: [phone]     Chief Complaint: Covid Positive Symptoms: Sore throat, headache, earache,fever 100.2, cough,muscle aches, mild SOB Frequency: Home test today, symptoms started yesterday Pertinent Negatives: Patient denies CP Disposition: [] ED /[] Urgent Care (no appt availability in office) / [] Appointment(In office/virtual)/ []  [facility]/ [] Home Care/ [] Refused Recommended Disposition /[] [facility]/ [x]  Follow-up with PCP Additional Notes: Home care advise provided as well as guidelines for self isolation. Pt is requesting oral anti viral med.  Please advise. Thank you  Reason for Disposition  [1] COVID-19 diagnosed by positive lab test (e.g., PCR, rapid self-test kit) AND [2] mild symptoms (e.g., cough, fever, others) AND [3] no complications or SOB  Answer Assessment - Initial Assessment Questions 1. COVID-19 DIAGNOSIS: "How do you know that you have COVID?" (e.g., positive lab test or self-test, diagnosed by doctor or NP/PA, symptoms after exposure).     Today, home test 2. COVID-19 EXPOSURE: "Was there any known exposure to COVID before the symptoms began?" CDC Definition of close contact: within 6 feet (2 meters) for a total of 15 minutes or more over a 24-hour period.      No 3. ONSET: "When did the COVID-19 symptoms start?"      Yesterday 4. WORST SYMPTOM: "What is your worst symptom?" (e.g., cough, fever, shortness of breath, muscle aches)      5. COUGH: "Do you have a cough?" If Yes, ask: "How bad is the cough?"       Yes, whitish 6. FEVER: "Do you have a fever?" If Yes, ask: "What is your temperature, how was it measured, and when did it start?"     100.2 7. RESPIRATORY STATUS: "Describe your breathing?" (e.g., normal; shortness of  breath, wheezing, unable to speak)      Mild SOB 8. BETTER-SAME-WORSE: "Are you getting better, staying the same or getting worse compared to yesterday?"  If getting worse, ask, "In what way?"     worse 9. OTHER SYMPTOMS: "Do you have any other symptoms?"  (e.g., chills, fatigue, headache, loss of smell or taste, muscle pain, sore throat)     Sore throat, earaches,achy 5/10, headache, chills 10. HIGH RISK DISEASE: "Do you have any chronic medical problems?" (e.g., asthma, heart or lung disease, weak immune system, obesity, etc.)        11. VACCINE: "Have you had the COVID-19 vaccine?" If Yes, ask: "Which one, how many shots, when did you get it?"     All and one booster  Protocols used: Coronavirus (COVID-19) Diagnosed or Suspected-A-AH

## 2023-06-29 ENCOUNTER — Encounter: Payer: Self-pay | Admitting: Family Medicine

## 2023-06-29 ENCOUNTER — Telehealth (INDEPENDENT_AMBULATORY_CARE_PROVIDER_SITE_OTHER): Payer: Self-pay | Admitting: Family Medicine

## 2023-06-29 VITALS — BP 142/88 | HR 78 | Temp 100.2°F | Resp 12

## 2023-06-29 DIAGNOSIS — J069 Acute upper respiratory infection, unspecified: Secondary | ICD-10-CM

## 2023-06-29 DIAGNOSIS — J209 Acute bronchitis, unspecified: Secondary | ICD-10-CM

## 2023-06-29 DIAGNOSIS — G4710 Hypersomnia, unspecified: Secondary | ICD-10-CM

## 2023-06-29 DIAGNOSIS — U071 COVID-19: Secondary | ICD-10-CM

## 2023-06-29 DIAGNOSIS — G4730 Sleep apnea, unspecified: Secondary | ICD-10-CM

## 2023-06-29 MED ORDER — NIRMATRELVIR/RITONAVIR (PAXLOVID)TABLET
3.0000 | ORAL_TABLET | Freq: Two times a day (BID) | ORAL | 0 refills | Status: AC
Start: 2023-06-29 — End: 2023-07-04

## 2023-06-29 MED ORDER — MODAFINIL 200 MG PO TABS
200.0000 mg | ORAL_TABLET | Freq: Every day | ORAL | 0 refills | Status: DC
Start: 2023-06-29 — End: 2023-08-30

## 2023-06-29 MED ORDER — ALBUTEROL SULFATE HFA 108 (90 BASE) MCG/ACT IN AERS
2.0000 | INHALATION_SPRAY | RESPIRATORY_TRACT | 1 refills | Status: DC | PRN
Start: 2023-06-29 — End: 2023-08-23

## 2023-06-29 MED ORDER — PREDNISONE 20 MG PO TABS
40.0000 mg | ORAL_TABLET | Freq: Every day | ORAL | 0 refills | Status: AC
Start: 2023-06-29 — End: 2023-07-04

## 2023-06-29 MED ORDER — BENZONATATE 100 MG PO CAPS
100.0000 mg | ORAL_CAPSULE | Freq: Three times a day (TID) | ORAL | 0 refills | Status: DC | PRN
Start: 2023-06-29 — End: 2023-08-23

## 2023-06-29 NOTE — Progress Notes (Signed)
Name: Teresa Gallagher   MRN: [id number]    DOB: November 21, 1961   Date:06/29/2023       Progress Note  Subjective:    Chief Complaint  Chief Complaint  Patient presents with   Covid Positive    At home yesterday   Cough   Sore Throat   Nasal Congestion    Sx started 06/27/2023    I connected with  Teresa Gallagher  on 06/29/23 at  8:40 AM EDT by a video enabled telemedicine application and verified that I am speaking with the correct person using two identifiers.  I discussed the limitations of evaluation and management by telemedicine and the availability of in person appointments. The patient expressed understanding and agreed to proceed. Staff also discussed with the patient that there may be a patient responsible charge related to this service. Patient Location: home Provider Location: [facility] clinic office Additional Individuals present: none  Cough Associated symptoms include a fever, postnasal drip, rhinorrhea, a sore throat, shortness of breath and wheezing. Pertinent negatives include no chest pain.  Sore Throat  Associated symptoms include coughing and shortness of breath. Pertinent negatives include no trouble swallowing.   Sx onset about 2 days  Sore throat, coughing, congestion, fever to tmax 100.2 before she started taking tylenol/ibuprofen A little shortness of breath, she had tried albuterol and that helped a little bit  Patient Active Problem List   Diagnosis Date Noted   Encounter for screening colonoscopy 11/08/2022   Adenomatous polyp of descending colon 11/08/2022   Rectal polyp 11/08/2022   Chronic diastolic heart failure (HCC) 06/10/2022   Breast lump on right side at 10 o'clock position 06/10/2022   Circadian rhythm sleep disorder, shift work type 03/23/2021   Obstructive sleep apnea syndrome 03/23/2021   Attention and concentration deficit 01/20/2020   Alcohol use disorder, mild, in early remission 10/29/2019   Old tear of meniscus of right knee 03/20/2019    SOB (shortness of breath) 08/29/2017   Episodic tension-type headache, not intractable 12/20/2016   History of blood transfusion 07/30/2015   GAD (generalized anxiety disorder) 07/30/2015   Chronic constipation 07/13/2015   Insomnia 07/13/2015   MDD (major depressive disorder), recurrent episode, mild (HCC) 07/13/2015   Fibromyalgia syndrome 07/13/2015   Herpes simplex type 2 infection 07/13/2015   Morbid obesity (HCC) 07/13/2015   Sleep apnea with hypersomnolence 07/13/2015   Vitamin D deficiency 07/13/2015    Social History   Tobacco Use   Smoking status: Never   Smokeless tobacco: Never  Substance Use Topics   Alcohol use: Not Currently     Current Outpatient Medications:    albuterol (VENTOLIN HFA) 108 (90 Base) MCG/ACT inhaler, TAKE 2 PUFFS BY MOUTH EVERY 6 HOURS AS NEEDED FOR WHEEZE OR SHORTNESS OF BREATH, Disp: 18 each, Rfl: 1   albuterol (VENTOLIN HFA) 108 (90 Base) MCG/ACT inhaler, Inhale 2 puffs into the lungs every 4 (four) hours as needed for wheezing or shortness of breath., Disp: 8 g, Rfl: 1   AUVELITY 45-105 MG TBCR, Take by mouth., Disp: , Rfl:    baclofen (LIORESAL) 10 MG tablet, TAKE 1 TABLET BY MOUTH THREE TIMES A DAY AS NEEDED FOR MUSCLE SPASMS, Disp: 30 tablet, Rfl: 0   BD PEN NEEDLE MICRO U/F 32G X 6 MM MISC, USE AS DIRECTED DAILY AT 12 NOON, Disp: 100 each, Rfl: 1   benzonatate (TESSALON) 100 MG capsule, Take 1-2 capsules (100-200 mg total) by mouth 3 (three) times daily as needed for cough.,  Disp: 60 capsule, Rfl: 0   CAPLYTA 10.5 MG CAPS, Take 1 capsule by mouth at bedtime., Disp: , Rfl:    CAPLYTA 42 MG capsule, Take 42 mg by mouth at bedtime., Disp: , Rfl:    furosemide (LASIX) 20 MG tablet, TAKE 1 TABLET (20 MG TOTAL) BY MOUTH AS NEEDED FOR FLUID OR EDEMA., Disp: 90 tablet, Rfl: 0   Glucos-Chond-Hyal Ac-Ca Fructo (MOVE FREE JOINT HEALTH ADVANCE PO), Take 1 tablet by mouth daily., Disp: , Rfl:    Glycerin-Hypromellose-PEG 400 (ARTIFICIAL TEARS)  0.2-0.2-1 % SOLN, Place 1 drop into both eyes daily as needed (dry eyes)., Disp: , Rfl:    hydrOXYzine (VISTARIL) 25 MG capsule, Take 25 mg by mouth 3 (three) times daily., Disp: , Rfl:    KLOR-CON M20 20 MEQ tablet, TAKE 1 TABLET (20 MEQ TOTAL) BY MOUTH DAILY AS NEEDED. WHEN YOU TAKE LASIX, Disp: 90 tablet, Rfl: 0   LORazepam (ATIVAN) 1 MG tablet, Take 0.5-1 mg by mouth daily as needed for anxiety., Disp: , Rfl:    meloxicam (MOBIC) 15 MG tablet, Take 15 mg by mouth daily., Disp: , Rfl:    Menthol, Topical Analgesic, (BIOFREEZE) 4 % GEL, Apply 1 application topically daily as needed (pain)., Disp: , Rfl:    Multiple Vitamin (MULTIVITAMIN) tablet, Take 1 tablet by mouth daily., Disp: , Rfl:    nirmatrelvir/ritonavir (PAXLOVID) 20 x 150 MG & 10 x 100MG  TABS, Take 3 tablets by mouth 2 (two) times daily for 5 days. (Take nirmatrelvir 150 mg two tablets twice daily for 5 days and ritonavir 100 mg one tablet twice daily for 5 days) Patient GFR is 60, Disp: 30 tablet, Rfl: 0   ondansetron (ZOFRAN-ODT) 4 MG disintegrating tablet, Take 1 tablet (4 mg total) by mouth every 8 (eight) hours as needed for nausea or vomiting., Disp: 15 tablet, Rfl: 0   predniSONE (DELTASONE) 20 MG tablet, Take 2 tablets (40 mg total) by mouth daily with breakfast for 5 days., Disp: 10 tablet, Rfl: 0   pregabalin (LYRICA) 50 MG capsule, Take 50 mg by mouth 3 (three) times daily., Disp: , Rfl:    terbinafine (LAMISIL) 250 MG tablet, Take 1 tablet (250 mg total) by mouth daily., Disp: 90 tablet, Rfl: 0   traZODone (DESYREL) 50 MG tablet, Take 50-150 mg by mouth at bedtime as needed., Disp: , Rfl:    triamcinolone acetonide (KENALOG-40) 40 MG/ML injection, SMARTSIG:2 Milliliter(s) IM, Disp: , Rfl:    valACYclovir (VALTREX) 500 MG tablet, TAKE 1 TABLET (500 MG TOTAL) BY MOUTH DAILY. AND TWICE DAILY FOR OUTBREAKS, Disp: 115 tablet, Rfl: 1   zinc gluconate 50 MG tablet, Take 50 mg by mouth daily., Disp: , Rfl:    modafinil (PROVIGIL)  200 MG tablet, Take 1 tablet (200 mg total) by mouth daily., Disp: 90 tablet, Rfl: 0  No Known Allergies  I personally reviewed active problem list, medication list, allergies, family history, social history, health maintenance, notes from last encounter, lab results, imaging with the patient/caregiver today.   Review of Systems  Constitutional:  Positive for fatigue and fever. Negative for activity change and appetite change.  HENT:  Positive for postnasal drip, rhinorrhea and sore throat. Negative for trouble swallowing and voice change.   Eyes: Negative.   Respiratory:  Positive for cough, shortness of breath and wheezing. Negative for chest tightness.   Cardiovascular: Negative.  Negative for chest pain.  Gastrointestinal: Negative.   Endocrine: Negative.   Genitourinary: Negative.   Musculoskeletal: Negative.  Skin: Negative.   Allergic/Immunologic: Negative.   Neurological: Negative.   Hematological: Negative.   Psychiatric/Behavioral: Negative.    All other systems reviewed and are negative.    Objective:   Virtual encounter, vitals limited, only able to obtain the following Today's Vitals   06/29/23 0901  BP: (!) 142/88  Pulse: 78  Resp: 12  Temp: 100.2 F (37.9 C)  SpO2: 95%   There is no height or weight on file to calculate BMI. Nursing Note and Vital Signs reviewed.  Physical Exam Vitals and nursing note reviewed.  Constitutional:      General: She is not in acute distress.    Appearance: She is not ill-appearing, toxic-appearing or diaphoretic.     Comments: Appeared tired and congested, but non-toxic and no acute distress  Neck:     Trachea: Phonation normal.  Cardiovascular:     Rate and Rhythm: Normal rate.  Pulmonary:     Effort: No tachypnea, accessory muscle usage, respiratory distress or retractions.     Comments: Able to speak in full and complete sentences, during virtual encounter a few short coughing fits, no audible wheeze or  stidor Neurological:     Mental Status: She is alert.  Psychiatric:        Mood and Affect: Mood normal.        Behavior: Behavior is cooperative.     PE limited by virtual encounter  No results found for this or any previous visit (from the past 72 hour(s)).  Assessment and Plan:     ICD-10-CM   1. Upper respiratory tract infection due to COVID-19 virus  U07.1 nirmatrelvir/ritonavir (PAXLOVID) 20 x 150 MG & 10 x 100MG  TABS   J06.9 benzonatate (TESSALON) 100 MG capsule    albuterol (VENTOLIN HFA) 108 (90 Base) MCG/ACT inhaler    predniSONE (DELTASONE) 20 MG tablet   sx onset 2 d ago, tx with paxlovid, OTC meds - mucinex, tylenol, cough meds, isolate until fever free then mask for 5 d afterwards    2. Acute bronchitis, unspecified organism  J20.9 benzonatate (TESSALON) 100 MG capsule    albuterol (VENTOLIN HFA) 108 (90 Base) MCG/ACT inhaler    predniSONE (DELTASONE) 20 MG tablet   coughing fits/bronchospasms with wheeze just begining this am, feels similar to past bronchitis where steroids and inhaler helped, albuterol, pred, mucinex    3. Sleep apnea with hypersomnolence  G47.10 modafinil (PROVIGIL) 200 MG tablet   G47.30    she requests refill on midofinil - reviewed controlled substance database, she will need to see pcp for next refills, sent in       -Red flags and when to present for emergency care or RTC including chest pain, shortness of breath, new/worsening/un-resolving symptoms, reviewed with patient at time of visit. Follow up and care instructions discussed and provided in AVS. - I discussed the assessment and treatment plan with the patient. The patient was provided an opportunity to ask questions and all were answered. The patient agreed with the plan and demonstrated an understanding of the instructions.  I provided 25+ minutes of non-face-to-face time during this encounter.  Danelle Berry, PA-C 06/29/23 9:24 AM

## 2023-06-29 NOTE — Telephone Encounter (Signed)
Virtual appt made  

## 2023-07-24 ENCOUNTER — Telehealth: Payer: Self-pay | Admitting: Family Medicine

## 2023-07-24 ENCOUNTER — Telehealth: Payer: Self-pay

## 2023-07-24 NOTE — Telephone Encounter (Signed)
PA done through cover my meds waiting on new insurance determination

## 2023-07-24 NOTE — Telephone Encounter (Signed)
Pt came in with her new insurance card ( and it is on file) and said that her [employer] back in feb 2024 was not covering it but she now has new insurance and is asking for [place] to try and get this covered again since has new insurance. Said she probably would need a prior approval on it.

## 2023-07-25 NOTE — Telephone Encounter (Signed)
PA APPROVED  

## 2023-07-26 ENCOUNTER — Telehealth: Payer: Self-pay | Admitting: Family Medicine

## 2023-07-26 NOTE — Telephone Encounter (Signed)
Tried calling pt  but vm was full to let her know that [facility] needs her to have an appt for her medication of wegovy

## 2023-08-07 ENCOUNTER — Ambulatory Visit: Payer: BC Managed Care – PPO | Admitting: Cardiovascular Disease

## 2023-08-07 DIAGNOSIS — G4733 Obstructive sleep apnea (adult) (pediatric): Secondary | ICD-10-CM

## 2023-08-19 ENCOUNTER — Other Ambulatory Visit: Payer: Self-pay | Admitting: Family Medicine

## 2023-08-22 ENCOUNTER — Encounter: Payer: BC Managed Care – PPO | Admitting: Family Medicine

## 2023-08-22 NOTE — Progress Notes (Signed)
Name: Teresa Gallagher   MRN: [id number]    DOB: 02-Nov-1961   Date:08/23/2023       Progress Note  Subjective  Chief Complaint  Annual Exam  HPI  Patient presents for annual CPE.  Diet: eats mostly at home , eats fruits and vegetables Exercise: not currently  due to generalized pain  Last Eye Exam: she is due for an exam  Last Dental Exam: up to date   Flowsheet Row Office Visit from 08/23/2023 in [facility]  AUDIT-C Score 1      Depression: Phq 9 is  positive - she is out of medication due to insurance problems but will contact Dr. Evelene Croon now that she has new coverage     08/23/2023    8:27 AM 06/29/2023    8:40 AM 04/04/2023   10:36 AM 02/20/2023    9:29 AM 10/17/2022    3:38 PM  Depression screen PHQ 2/9  Decreased Interest 3 2 3 3 3   Down, Depressed, Hopeless 2 2 2 3 3   PHQ - 2 Score 5 4 5 6 6   Altered sleeping 2 2 2 1 3   Tired, decreased energy 3 2 2 3 3   Change in appetite 3 2 0 0 3  Feeling bad or failure about yourself  2 1 0 3 2  Trouble concentrating 3 0 0 3 3  Moving slowly or fidgety/restless 0 0 0 0 2  Suicidal thoughts 0 0 0 0 0  PHQ-9 Score 18 11 9 16 22   Difficult doing work/chores Extremely dIfficult Somewhat difficult Somewhat difficult  Extremely dIfficult   Hypertension: BP Readings from Last 3 Encounters:  08/23/23 136/78  06/29/23 (!) 142/88  04/20/23 (!) 148/87   Obesity: Wt Readings from Last 3 Encounters:  08/23/23 214 lb 6.4 oz (97.3 kg)  04/20/23 204 lb (92.5 kg)  04/04/23 206 lb 14.4 oz (93.8 kg)   BMI Readings from Last 3 Encounters:  08/23/23 35.68 kg/m  04/20/23 33.95 kg/m  04/04/23 34.43 kg/m     Vaccines:   Tdap: today  Shingrix: up to date Pneumonia: N/A Flu: Due , she will return in October  COVID-19: up to date   Hep C Screening: 09/18/18 STD testing and prevention (HIV/chl/gon/syphilis): 09/18/18 - not interested today  Intimate partner violence: negative screen  Sexual History : one  partner for the past 6 months  Menstrual History/LMP/Abnormal Bleeding: s/p hysterectomy  Discussed importance of follow up if any post-menopausal bleeding: N/A Incontinence Symptoms: negative for symptoms   Breast cancer:  - Last Mammogram: 2023, due and she goes to [place]  - BRCA gene screening: N/A  Osteoporosis Prevention : Discussed high calcium and vitamin D supplementation, weight bearing exercises Bone density: 11/11/20   Cervical cancer screening: N/A  Skin cancer: Discussed monitoring for atypical lesions  Colorectal cancer: 11/08/22   Lung cancer:  Low Dose CT Chest recommended if Age 58-80 years, 20 pack-year currently smoking OR have quit w/in 15years. Patient does not qualify for screen   ECG: 08/03/23  Advanced Care Planning: A voluntary discussion about advance care planning including the explanation and discussion of advance directives.  Discussed health care proxy and Living will, and the patient was able to identify a health care proxy as daughter Mardella Layman.  Patient does not have a living will and power of attorney of health care   Lipids: Lab Results  Component Value Date   CHOL 223 (H) 08/09/2021   CHOL 199 08/05/2020  CHOL 193 08/02/2019   Lab Results  Component Value Date   HDL 66 08/09/2021   HDL 56 08/05/2020   HDL 60 08/02/2019   Lab Results  Component Value Date   LDLCALC 135 (H) 08/09/2021   LDLCALC 120 (H) 08/05/2020   LDLCALC 116 (H) 08/02/2019   Lab Results  Component Value Date   TRIG 116 08/09/2021   TRIG 123 08/05/2020   TRIG 80 08/02/2019   Lab Results  Component Value Date   CHOLHDL 3.4 08/09/2021   CHOLHDL 3.6 08/05/2020   CHOLHDL 3.2 08/02/2019   No results found for: "LDLDIRECT"  Glucose: Glucose, Bld  Date Value Ref Range Status  04/20/2023 131 (H) 70 - 99 mg/dL Final    Comment:    Glucose reference range applies only to samples taken after fasting for at least 8 hours.  08/27/2021 83 70 - 99 mg/dL Final    Comment:     Glucose reference range applies only to samples taken after fasting for at least 8 hours.  08/09/2021 112 (H) 65 - 99 mg/dL Final    Comment:    .            Fasting reference interval . For someone without known diabetes, a glucose value between 100 and 125 mg/dL is consistent with prediabetes and should be confirmed with a follow-up test. .     Patient Active Problem List   Diagnosis Date Noted   Encounter for screening colonoscopy 11/08/2022   Adenomatous polyp of descending colon 11/08/2022   Rectal polyp 11/08/2022   Chronic diastolic heart failure (HCC) 06/10/2022   Breast lump on right side at 10 o'clock position 06/10/2022   Circadian rhythm sleep disorder, shift work type 03/23/2021   Obstructive sleep apnea syndrome 03/23/2021   Attention and concentration deficit 01/20/2020   Alcohol use disorder, mild, in early remission 10/29/2019   Old tear of meniscus of right knee 03/20/2019   SOB (shortness of breath) 08/29/2017   Episodic tension-type headache, not intractable 12/20/2016   History of blood transfusion 07/30/2015   GAD (generalized anxiety disorder) 07/30/2015   Chronic constipation 07/13/2015   Insomnia 07/13/2015   MDD (major depressive disorder), recurrent episode, mild (HCC) 07/13/2015   Fibromyalgia syndrome 07/13/2015   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 Earnestine Leys at [facility]    CARDIAC CATHETERIZATION     CARPAL TUNNEL RELEASE Left 12/19/2010   CESAREAN SECTION     COLONOSCOPY WITH PROPOFOL N/A 11/08/2022   Procedure: COLONOSCOPY WITH PROPOFOL;  Surgeon: Toney Reil, MD;  Location: [facility] ENDOSCOPY;  Service: Gastroenterology;  Laterality: N/A;   LAPAROSCOPIC HYSTERECTOMY     followed by 3  cuff repairs   LIPOMA EXCISION  12/19/2010   REFRACTIVE SURGERY Bilateral    RIGHT/LEFT HEART CATH AND CORONARY ANGIOGRAPHY N/A 09/02/2021   Procedure: RIGHT/LEFT HEART CATH AND CORONARY ANGIOGRAPHY;  Surgeon: Dolores Patty, MD;  Location: [facility] INVASIVE CV LAB;  Service: Cardiovascular;  Laterality: N/A;   TONSILLECTOMY      Family History  Problem Relation Age of Onset   Heart attack Father    Alcohol abuse Father    COPD Sister        end stages   Bipolar disorder Brother  Depression Brother    Bipolar disorder Brother    Depression Brother    Alcohol abuse Brother    Heart attack Paternal Uncle    Breast cancer Paternal Aunt    Breast cancer Maternal Aunt     Social History   Socioeconomic History   Marital status: Divorced    Spouse name: larry   Number of children: 3   Years of education: Not on file   Highest education level: Bachelor's degree (e.g., BA, AB, BS)  Occupational History   Not on file  Tobacco Use   Smoking status: Never   Smokeless tobacco: Never  Vaping Use   Vaping status: Never Used  Substance and Sexual Activity   Alcohol use: Not Currently   Drug use: No   Sexual activity: Not Currently  Other Topics Concern   Not on file  Social History Narrative   She has been on disability since end of 2021   She has a grown son that has Down's syndrome that lives with her       Social Determinants of Health   Financial Resource Strain: Medium Risk (08/23/2023)   Overall Financial Resource Strain (CARDIA)    Difficulty of Paying Living Expenses: Somewhat hard  Food Insecurity: Food Insecurity Present (08/23/2023)   Hunger Vital Sign    Worried About Running Out of Food in the Last Year: Sometimes true    Ran Out of Food in the Last Year: Sometimes true  Transportation Needs: No Transportation Needs (08/23/2023)   PRAPARE - Administrator, Civil Service (Medical): No    Lack of Transportation (Non-Medical): No  Physical Activity:  Inactive (08/23/2023)   Exercise Vital Sign    Days of Exercise per Week: 0 days    Minutes of Exercise per Session: 0 min  Stress: Stress Concern Present (08/23/2023)   [employer] of Occupational Health - Occupational Stress Questionnaire    Feeling of Stress : Rather much  Social Connections: Socially Isolated (08/23/2023)   Social Connection and Isolation Panel [NHANES]    Frequency of Communication with Friends and Family: Twice a week    Frequency of Social Gatherings with Friends and Family: Never    Attends Religious Services: Never    Database administrator or Organizations: No    Attends Banker Meetings: Never    Marital Status: Divorced  Catering manager Violence: Not At Risk (08/23/2023)   Humiliation, Afraid, Rape, and Kick questionnaire    Fear of Current or Ex-Partner: No    Emotionally Abused: No    Physically Abused: No    Sexually Abused: No     Current Outpatient Medications:    AUVELITY 45-105 MG TBCR, Take by mouth., Disp: , Rfl:    BD PEN NEEDLE MICRO U/F 32G X 6 MM MISC, USE AS DIRECTED DAILY AT 12 NOON, Disp: 100 each, Rfl: 1   furosemide (LASIX) 20 MG tablet, TAKE 1 TABLET (20 MG TOTAL) BY MOUTH AS NEEDED FOR FLUID OR EDEMA., Disp: 90 tablet, Rfl: 0   Glucos-Chond-Hyal Ac-Ca Fructo (MOVE FREE JOINT HEALTH ADVANCE PO), Take 1 tablet by mouth daily., Disp: , Rfl:    Glycerin-Hypromellose-PEG 400 (ARTIFICIAL TEARS) 0.2-0.2-1 % SOLN, Place 1 drop into both eyes daily as needed (dry eyes)., Disp: , Rfl:    hydrOXYzine (VISTARIL) 25 MG capsule, Take 25 mg by mouth 3 (three) times daily., Disp: , Rfl:    KLOR-CON M20 20 MEQ tablet, TAKE 1 TABLET (20 MEQ TOTAL)  BY MOUTH DAILY AS NEEDED. WHEN YOU TAKE LASIX, Disp: 90 tablet, Rfl: 0   meloxicam (MOBIC) 15 MG tablet, Take 15 mg by mouth daily., Disp: , Rfl:    Menthol, Topical Analgesic, (BIOFREEZE) 4 % GEL, Apply 1 application topically daily as needed (pain)., Disp: , Rfl:    modafinil (PROVIGIL) 200 MG  tablet, Take 1 tablet (200 mg total) by mouth daily., Disp: 90 tablet, Rfl: 0   Multiple Vitamin (MULTIVITAMIN) tablet, Take 1 tablet by mouth daily., Disp: , Rfl:    traZODone (DESYREL) 50 MG tablet, Take 50-150 mg by mouth at bedtime as needed., Disp: , Rfl:    valACYclovir (VALTREX) 500 MG tablet, TAKE 1 TABLET (500 MG TOTAL) BY MOUTH DAILY. AND TWICE DAILY FOR OUTBREAKS, Disp: 115 tablet, Rfl: 1   zinc gluconate 50 MG tablet, Take 50 mg by mouth daily., Disp: , Rfl:    baclofen (LIORESAL) 10 MG tablet, TAKE 1 TABLET BY MOUTH THREE TIMES A DAY AS NEEDED FOR MUSCLE SPASMS (Patient not taking: Reported on 08/23/2023), Disp: 30 tablet, Rfl: 0   CAPLYTA 10.5 MG CAPS, Take 1 capsule by mouth at bedtime. (Patient not taking: Reported on 08/23/2023), Disp: , Rfl:    LORazepam (ATIVAN) 1 MG tablet, Take 0.5-1 mg by mouth daily as needed for anxiety. (Patient not taking: Reported on 08/23/2023), Disp: , Rfl:    pregabalin (LYRICA) 50 MG capsule, Take 50 mg by mouth 3 (three) times daily. (Patient not taking: Reported on 08/23/2023), Disp: , Rfl:   No Known Allergies   ROS  Constitutional: Negative for fever or weight change.  Respiratory: Negative for cough and shortness of breath.   Cardiovascular: Negative for chest pain or palpitations.  Gastrointestinal: Negative for abdominal pain, no bowel changes.  Musculoskeletal: Negative for gait problem , she has multiple joint pains - right shoulder, both writs , left hip and right knee plus family history of RA Skin: Negative for rash.  Neurological: Negative for dizziness or headache.  No other specific complaints in a complete review of systems (except as listed in HPI above).   Objective  Vitals:   08/23/23 0811  BP: 136/78  Pulse: 85  Resp: 16  Temp: (!) 97.5 F (36.4 C)  TempSrc: Oral  SpO2: 98%  Weight: 214 lb 6.4 oz (97.3 kg)  Height: 5\' 5"  (1.651 m)    Body mass index is 35.68 kg/m.  Physical Exam  Constitutional: Patient appears  well-developed and well-nourished. Obese No distress.  HENT: Head: Normocephalic and atraumatic. Ears: B TMs ok, no erythema or effusion; Nose: Nose normal. Mouth/Throat: Oropharynx is clear and moist. No oropharyngeal exudate.  Eyes: Conjunctivae and EOM are normal. Pupils are equal, round, and reactive to light. No scleral icterus.  Neck: Normal range of motion. Neck supple. No JVD present.mild swelling over cricoid thyroid , likely thyroid isthmus? Getting [place]  Cardiovascular: Normal rate, regular rhythm and normal heart sounds.  No murmur heard. No BLE edema. Pulmonary/Chest: Effort normal and breath sounds normal. No respiratory distress. Abdominal: Soft. Bowel sounds are normal, no distension. There is no tenderness. no masses Breast: tender spot and irregular mass on right upper outer quadrant ( near axilla) no nipple discharge or rashes FEMALE GENITALIA:  Not done  RECTAL: not done  Musculoskeletal: Normal range of motion, no joint effusions. No gross deformities Neurological: he is alert and oriented to person, place, and time. No cranial nerve deficit. Coordination, balance, strength, speech and gait are normal.  Skin: Skin is  warm and dry. No rash noted. No erythema.  Psychiatric: Patient has a normal mood and affect. behavior is normal. Judgment and thought content normal.    Fall Risk:    06/29/2023    8:40 AM 04/04/2023   10:36 AM 02/20/2023    7:55 AM 10/17/2022    3:30 PM 06/10/2022   11:21 AM  Fall Risk   Falls in the past year? 0 0 0 1 0  Number falls in past yr: 0 0 0 1 0  Injury with Fall? 0 0 0 1 0  Risk for fall due to : No Fall Risks No Fall Risks No Fall Risks History of fall(s) No Fall Risks  Follow up Falls prevention discussed;Education provided;Falls evaluation completed Falls prevention discussed;Education provided;Falls evaluation completed Falls prevention discussed Falls prevention discussed;Education provided;Falls evaluation completed Falls prevention  discussed     Functional Status Survey: Is the patient deaf or have difficulty hearing?: No Does the patient have difficulty seeing, even when wearing glasses/contacts?: No Does the patient have difficulty concentrating, remembering, or making decisions?: Yes Does the patient have difficulty walking or climbing stairs?: Yes Does the patient have difficulty dressing or bathing?: No Does the patient have difficulty doing errands alone such as visiting a doctor's office or shopping?: Yes   Assessment & Plan  1. Well adult exam  - Lipid panel - CBC with Differential/Platelet - COMPLETE METABOLIC PANEL WITH GFR - Hemoglobin A1c - TSH  2. Vitamin D deficiency  - VITAMIN D 25 Hydroxy (Vit-D Deficiency, Fractures)  3. Breast cancer screening by mammogram  Diagnostic mammogram   4. Dyslipidemia  - Lipid panel  5. Long-term use of high-risk medication  - CBC with Differential/Platelet - COMPLETE METABOLIC PANEL WITH GFR  6. Family history of rheumatoid arthritis  - Sedimentation rate - C-reactive protein - ANA,IFA RA Diag Pnl w/rflx Tit/Patn   7. Need for Tdap vaccination  - Tdap vaccine greater than or equal to 7yo IM  8. Enlarged thyroid  - US THYROID; Future  9. Breast lump on right side at 11 o'clock position  - MM 3D DIAGNOSTIC MAMMOGRAM BILATERAL BREAST; Future - [place] LIMITED ULTRASOUND INCLUDING AXILLA RIGHT BREAST; Future - [place] LIMITED ULTRASOUND INCLUDING AXILLA LEFT BREAST ; Future   -USPSTF grade A and B recommendations reviewed with patient; age-appropriate recommendations, preventive care, screening tests, etc discussed and encouraged; healthy living encouraged; see AVS for patient education given to patient -Discussed importance of 150 minutes of physical activity weekly, eat two servings of fish weekly, eat one serving of tree nuts ( cashews, pistachios, pecans, almonds.Marland Kitchen) every other day, eat 6 servings of fruit/vegetables daily and drink plenty of  water and avoid sweet beverages.   -Reviewed Health Maintenance: Yes.

## 2023-08-22 NOTE — Progress Notes (Deleted)
Name: Teresa Gallagher   MRN: [id number]    DOB: May 13, 1961   Date:08/22/2023       Progress Note  Subjective  Chief Complaint  Annual Exam  HPI  Patient presents for annual CPE.  Diet: *** Exercise: ***  Last Eye Exam: *** Last Dental Exam: ***  Flowsheet Row Office Visit from 04/04/2023 in [facility]  AUDIT-C Score 2      Depression: Phq 9 is  {Desc; negative/positive:13464}    06/29/2023    8:40 AM 04/04/2023   10:36 AM 02/20/2023    9:29 AM 10/17/2022    3:38 PM 06/10/2022   11:29 AM  Depression screen PHQ 2/9  Decreased Interest 2 3 3 3 3   Down, Depressed, Hopeless 2 2 3 3 2   PHQ - 2 Score 4 5 6 6 5   Altered sleeping 2 2 1 3 3   Tired, decreased energy 2 2 3 3 3   Change in appetite 2 0 0 3 0  Feeling bad or failure about yourself  1 0 3 2 2   Trouble concentrating 0 0 3 3 3   Moving slowly or fidgety/restless 0 0 0 2 0  Suicidal thoughts 0 0 0 0 0  PHQ-9 Score 11 9 16 22 16   Difficult doing work/chores Somewhat difficult Somewhat difficult  Extremely dIfficult    Hypertension: BP Readings from Last 3 Encounters:  06/29/23 (!) 142/88  04/20/23 (!) 148/87  04/04/23 114/68   Obesity: Wt Readings from Last 3 Encounters:  04/20/23 204 lb (92.5 kg)  04/04/23 206 lb 14.4 oz (93.8 kg)  03/16/23 209 lb (94.8 kg)   BMI Readings from Last 3 Encounters:  04/20/23 33.95 kg/m  04/04/23 34.43 kg/m  03/16/23 34.78 kg/m     Vaccines:   HPV: N/A Tdap: Due Shingrix: up to date Pneumonia: N/A Flu: Due COVID-19: up to date   Hep C Screening: 09/18/18 STD testing and prevention (HIV/chl/gon/syphilis): 09/18/18 Intimate partner violence: negative screen  Sexual History : Menstrual History/LMP/Abnormal Bleeding:  Discussed importance of follow up if any post-menopausal bleeding: yes  Incontinence Symptoms: negative for symptoms   Breast cancer:  - Last Mammogram: 2023, due - BRCA gene screening: N/A  Osteoporosis Prevention :  Discussed high calcium and vitamin D supplementation, weight bearing exercises Bone density: 11/11/20   Cervical cancer screening: N/A  Skin cancer: Discussed monitoring for atypical lesions  Colorectal cancer: 11/08/22   Lung cancer:  Low Dose CT Chest recommended if Age 60-80 years, 20 pack-year currently smoking OR have quit w/in 15years. Patient does not qualify for screen   ECG: 08/03/23  Advanced Care Planning: A voluntary discussion about advance care planning including the explanation and discussion of advance directives.  Discussed health care proxy and Living will, and the patient was able to identify a health care proxy as ***.  Patient does not have a living will and power of attorney of health care   Lipids: Lab Results  Component Value Date   CHOL 223 (H) 08/09/2021   CHOL 199 08/05/2020   CHOL 193 08/02/2019   Lab Results  Component Value Date   HDL 66 08/09/2021   HDL 56 08/05/2020   HDL 60 08/02/2019   Lab Results  Component Value Date   LDLCALC 135 (H) 08/09/2021   LDLCALC 120 (H) 08/05/2020   LDLCALC 116 (H) 08/02/2019   Lab Results  Component Value Date   TRIG 116 08/09/2021   TRIG 123 08/05/2020   TRIG 80 08/02/2019  Lab Results  Component Value Date   CHOLHDL 3.4 08/09/2021   CHOLHDL 3.6 08/05/2020   CHOLHDL 3.2 08/02/2019   No results found for: "LDLDIRECT"  Glucose: Glucose, Bld  Date Value Ref Range Status  04/20/2023 131 (H) 70 - 99 mg/dL Final    Comment:    Glucose reference range applies only to samples taken after fasting for at least 8 hours.  08/27/2021 83 70 - 99 mg/dL Final    Comment:    Glucose reference range applies only to samples taken after fasting for at least 8 hours.  08/09/2021 112 (H) 65 - 99 mg/dL Final    Comment:    .            Fasting reference interval . For someone without known diabetes, a glucose value between 100 and 125 mg/dL is consistent with prediabetes and should be confirmed with a follow-up  test. .     Patient Active Problem List   Diagnosis Date Noted   Encounter for screening colonoscopy 11/08/2022   Adenomatous polyp of descending colon 11/08/2022   Rectal polyp 11/08/2022   Chronic diastolic heart failure (HCC) 06/10/2022   Breast lump on right side at 10 o'clock position 06/10/2022   Circadian rhythm sleep disorder, shift work type 03/23/2021   Obstructive sleep apnea syndrome 03/23/2021   Attention and concentration deficit 01/20/2020   Alcohol use disorder, mild, in early remission 10/29/2019   Old tear of meniscus of right knee 03/20/2019   SOB (shortness of breath) 08/29/2017   Episodic tension-type headache, not intractable 12/20/2016   History of blood transfusion 07/30/2015   GAD (generalized anxiety disorder) 07/30/2015   Chronic constipation 07/13/2015   Insomnia 07/13/2015   MDD (major depressive disorder), recurrent episode, mild (HCC) 07/13/2015   Fibromyalgia syndrome 07/13/2015   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 Earnestine [facility]ys [facility]    CARDIAC CATHETERIZATION     CARPAL TUNNEL RELEASE Left 12/19/2010   CESAREAN SECTION     COLONOSCOPY WITH PROPOFOL N/A 11/08/2022   Procedure: COLONOSCOPY WITH PROPOFOL;  Surgeon: Toney Reil, MD;  Location: [facility] ENDOSCOPY;  Service: Gastroenterology;  Laterality: N/A;   LAPAROSCOPIC HYSTERECTOMY     followed by 3 cuff repairs   LIPOMA EXCISION  12/19/2010   REFRACTIVE SURGERY Bilateral    RIGHT/LEFT HEART CATH AND CORONARY ANGIOGRAPHY N/A 09/02/2021   Procedure: RIGHT/LEFT HEART CATH AND CORONARY ANGIOGRAPHY;  Surgeon: Dolores Patty, MD;  Location: [facility] INVASIVE CV LAB;  Service: Cardiovascular;  Laterality: N/A;   TONSILLECTOMY      Family  History  Problem Relation Age of Onset   Heart attack Father    Alcohol abuse Father    COPD Sister        end stages   Bipolar disorder Brother    Depression Brother    Bipolar disorder Brother    Depression Brother    Alcohol abuse Brother    Heart attack Paternal Uncle    Breast cancer Paternal Aunt    Breast cancer Maternal Aunt     Social History   Socioeconomic History   Marital status: Divorced    Spouse name: larry   Number of children: 3   Years of education: Not on  file   Highest education level: Bachelor's degree (e.g., BA, AB, BS)  Occupational History   Not on file  Tobacco Use   Smoking status: Never   Smokeless tobacco: Never  Vaping Use   Vaping status: Never Used  Substance and Sexual Activity   Alcohol use: Not Currently   Drug use: No   Sexual activity: Not Currently  Other Topics Concern   Not on file  Social History Narrative   She has been on disability since end of 2021   She has a grown son that has Down's syndrome that lives with her       Social Determinants of Health   Financial Resource Strain: Medium Risk (04/04/2023)   Overall Financial Resource Strain (CARDIA)    Difficulty of Paying Living Expenses: Somewhat hard  Food Insecurity: Food Insecurity Present (04/04/2023)   Hunger Vital Sign    Worried About Running Out of Food in the Last Year: Sometimes true    Ran Out of Food in the Last Year: Often true  Transportation Needs: No Transportation Needs (04/04/2023)   PRAPARE - Administrator, Civil Service (Medical): No    Lack of Transportation (Non-Medical): No  Physical Activity: Unknown (04/04/2023)   Exercise Vital Sign    Days of Exercise per Week: 0 days    Minutes of Exercise per Session: Not on file  Stress: Stress Concern Present (04/04/2023)   [employer] of Occupational Health - Occupational Stress Questionnaire    Feeling of Stress : Very much  Social Connections: Socially Isolated (04/04/2023)   Social  Connection and Isolation Panel [NHANES]    Frequency of Communication with Friends and Family: More than three times a week    Frequency of Social Gatherings with Friends and Family: Once a week    Attends Religious Services: Never    Database administrator or Organizations: No    Attends Engineer, structural: Not on file    Marital Status: Divorced  Intimate Partner Violence: Not At Risk (08/09/2021)   Humiliation, Afraid, Rape, and Kick questionnaire    Fear of Current or Ex-Partner: No    Emotionally Abused: No    Physically Abused: No    Sexually Abused: No     Current Outpatient Medications:    albuterol (VENTOLIN HFA) 108 (90 Base) MCG/ACT inhaler, TAKE 2 PUFFS BY MOUTH EVERY 6 HOURS AS NEEDED FOR WHEEZE OR SHORTNESS OF BREATH, Disp: 18 each, Rfl: 1   albuterol (VENTOLIN HFA) 108 (90 Base) MCG/ACT inhaler, Inhale 2 puffs into the lungs every 4 (four) hours as needed for wheezing or shortness of breath., Disp: 8 g, Rfl: 1   albuterol (VENTOLIN HFA) 108 (90 Base) MCG/ACT inhaler, INHALE 2 PUFFS INTO THE LUNGS EVERY 4 HOURS AS NEEDED FOR WHEEZING OR SHORTNESS OF BREATH., Disp: 8.5 each, Rfl: 2   AUVELITY 45-105 MG TBCR, Take by mouth., Disp: , Rfl:    baclofen (LIORESAL) 10 MG tablet, TAKE 1 TABLET BY MOUTH THREE TIMES A DAY AS NEEDED FOR MUSCLE SPASMS, Disp: 30 tablet, Rfl: 0   BD PEN NEEDLE MICRO U/F 32G X 6 MM MISC, USE AS DIRECTED DAILY AT 12 NOON, Disp: 100 each, Rfl: 1   benzonatate (TESSALON) 100 MG capsule, Take 1-2 capsules (100-200 mg total) by mouth 3 (three) times daily as needed for cough., Disp: 60 capsule, Rfl: 0   CAPLYTA 10.5 MG CAPS, Take 1 capsule by mouth at bedtime., Disp: , Rfl:    CAPLYTA  42 MG capsule, Take 42 mg by mouth at bedtime., Disp: , Rfl:    furosemide (LASIX) 20 MG tablet, TAKE 1 TABLET (20 MG TOTAL) BY MOUTH AS NEEDED FOR FLUID OR EDEMA., Disp: 90 tablet, Rfl: 0   Glucos-Chond-Hyal Ac-Ca Fructo (MOVE FREE JOINT HEALTH ADVANCE PO), Take 1 tablet  by mouth daily., Disp: , Rfl:    Glycerin-Hypromellose-PEG 400 (ARTIFICIAL TEARS) 0.2-0.2-1 % SOLN, Place 1 drop into both eyes daily as needed (dry eyes)., Disp: , Rfl:    hydrOXYzine (VISTARIL) 25 MG capsule, Take 25 mg by mouth 3 (three) times daily., Disp: , Rfl:    KLOR-CON M20 20 MEQ tablet, TAKE 1 TABLET (20 MEQ TOTAL) BY MOUTH DAILY AS NEEDED. WHEN YOU TAKE LASIX, Disp: 90 tablet, Rfl: 0   LORazepam (ATIVAN) 1 MG tablet, Take 0.5-1 mg by mouth daily as needed for anxiety., Disp: , Rfl:    meloxicam (MOBIC) 15 MG tablet, Take 15 mg by mouth daily., Disp: , Rfl:    Menthol, Topical Analgesic, (BIOFREEZE) 4 % GEL, Apply 1 application topically daily as needed (pain)., Disp: , Rfl:    modafinil (PROVIGIL) 200 MG tablet, Take 1 tablet (200 mg total) by mouth daily., Disp: 90 tablet, Rfl: 0   Multiple Vitamin (MULTIVITAMIN) tablet, Take 1 tablet by mouth daily., Disp: , Rfl:    ondansetron (ZOFRAN-ODT) 4 MG disintegrating tablet, Take 1 tablet (4 mg total) by mouth every 8 (eight) hours as needed for nausea or vomiting., Disp: 15 tablet, Rfl: 0   pregabalin (LYRICA) 50 MG capsule, Take 50 mg by mouth 3 (three) times daily., Disp: , Rfl:    terbinafine (LAMISIL) 250 MG tablet, Take 1 tablet (250 mg total) by mouth daily., Disp: 90 tablet, Rfl: 0   traZODone (DESYREL) 50 MG tablet, Take 50-150 mg by mouth at bedtime as needed., Disp: , Rfl:    triamcinolone acetonide (KENALOG-40) 40 MG/ML injection, SMARTSIG:2 Milliliter(s) IM, Disp: , Rfl:    valACYclovir (VALTREX) 500 MG tablet, TAKE 1 TABLET (500 MG TOTAL) BY MOUTH DAILY. AND TWICE DAILY FOR OUTBREAKS, Disp: 115 tablet, Rfl: 1   zinc gluconate 50 MG tablet, Take 50 mg by mouth daily., Disp: , Rfl:   No Known Allergies   ROS  ***  Objective  There were no vitals filed for this visit.  There is no height or weight on file to calculate BMI.  Physical Exam ***  No results found for this or any previous visit (from the past 2160  hour(s)).   Fall Risk:    06/29/2023    8:40 AM 04/04/2023   10:36 AM 02/20/2023    7:55 AM 10/17/2022    3:30 PM 06/10/2022   11:21 AM  Fall Risk   Falls in the past year? 0 0 0 1 0  Number falls in past yr: 0 0 0 1 0  Injury with Fall? 0 0 0 1 0  Risk for fall due to : No Fall Risks No Fall Risks No Fall Risks History of fall(s) No Fall Risks  Follow up Falls prevention discussed;Education provided;Falls evaluation completed Falls prevention discussed;Education provided;Falls evaluation completed Falls prevention discussed Falls prevention discussed;Education provided;Falls evaluation completed Falls prevention discussed     Functional Status Survey:     Assessment & Plan  1. Well adult exam ***   -USPSTF grade A and B recommendations reviewed with patient; age-appropriate recommendations, preventive care, screening tests, etc discussed and encouraged; healthy living encouraged; see AVS for patient education given  to patient -Discussed importance of 150 minutes of physical activity weekly, eat two servings of fish weekly, eat one serving of tree nuts ( cashews, pistachios, pecans, almonds.Marland Kitchen) every other day, eat 6 servings of fruit/vegetables daily and drink plenty of water and avoid sweet beverages.   -Reviewed Health Maintenance: Yes.

## 2023-08-23 ENCOUNTER — Ambulatory Visit (INDEPENDENT_AMBULATORY_CARE_PROVIDER_SITE_OTHER): Payer: BC Managed Care – PPO | Admitting: Family Medicine

## 2023-08-23 ENCOUNTER — Encounter: Payer: Self-pay | Admitting: Family Medicine

## 2023-08-23 ENCOUNTER — Other Ambulatory Visit: Payer: Self-pay | Admitting: Family Medicine

## 2023-08-23 VITALS — BP 136/78 | HR 85 | Temp 97.5°F | Resp 16 | Ht 65.0 in | Wt 214.4 lb

## 2023-08-23 DIAGNOSIS — E559 Vitamin D deficiency, unspecified: Secondary | ICD-10-CM | POA: Diagnosis not present

## 2023-08-23 DIAGNOSIS — Z0001 Encounter for general adult medical examination with abnormal findings: Secondary | ICD-10-CM | POA: Diagnosis not present

## 2023-08-23 DIAGNOSIS — Z23 Encounter for immunization: Secondary | ICD-10-CM | POA: Diagnosis not present

## 2023-08-23 DIAGNOSIS — Z79899 Other long term (current) drug therapy: Secondary | ICD-10-CM

## 2023-08-23 DIAGNOSIS — E049 Nontoxic goiter, unspecified: Secondary | ICD-10-CM

## 2023-08-23 DIAGNOSIS — N6311 Unspecified lump in the right breast, upper outer quadrant: Secondary | ICD-10-CM

## 2023-08-23 DIAGNOSIS — I5032 Chronic diastolic (congestive) heart failure: Secondary | ICD-10-CM

## 2023-08-23 DIAGNOSIS — Z0000 Encounter for general adult medical examination without abnormal findings: Secondary | ICD-10-CM

## 2023-08-23 DIAGNOSIS — Z8261 Family history of arthritis: Secondary | ICD-10-CM | POA: Diagnosis not present

## 2023-08-23 DIAGNOSIS — E785 Hyperlipidemia, unspecified: Secondary | ICD-10-CM

## 2023-08-23 DIAGNOSIS — Z1231 Encounter for screening mammogram for malignant neoplasm of breast: Secondary | ICD-10-CM

## 2023-08-23 NOTE — Progress Notes (Deleted)
Name: Teresa Gallagher   MRN: [id number]    DOB: Jun 02, 1961   Date:08/22/2023       Progress Note  Subjective  Chief Complaint  Annual Exam  HPI  Patient presents for annual CPE.  Diet: *** Exercise: ***  Last Eye Exam: 2021 [facility] Last Dental Exam: June 2024 [facility]  [employer] Office Visit from 04/04/2023 in [facility]  AUDIT-C Score 2      Depression: Phq 9 is  {Desc; negative/positive:13464}    06/29/2023    8:40 AM 04/04/2023   10:36 AM 02/20/2023    9:29 AM 10/17/2022    3:38 PM 06/10/2022   11:29 AM  Depression screen PHQ 2/9  Decreased Interest 2 3 3 3 3   Down, Depressed, Hopeless 2 2 3 3 2   PHQ - 2 Score 4 5 6 6 5   Altered sleeping 2 2 1 3 3   Tired, decreased energy 2 2 3 3 3   Change in appetite 2 0 0 3 0  Feeling bad or failure about yourself  1 0 3 2 2   Trouble concentrating 0 0 3 3 3   Moving slowly or fidgety/restless 0 0 0 2 0  Suicidal thoughts 0 0 0 0 0  PHQ-9 Score 11 9 16 22 16   Difficult doing work/chores Somewhat difficult Somewhat difficult  Extremely dIfficult    Hypertension: BP Readings from Last 3 Encounters:  06/29/23 (!) 142/88  04/20/23 (!) 148/87  04/04/23 114/68   Obesity: Wt Readings from Last 3 Encounters:  04/20/23 204 lb (92.5 kg)  04/04/23 206 lb 14.4 oz (93.8 kg)  03/16/23 209 lb (94.8 kg)   BMI Readings from Last 3 Encounters:  04/20/23 33.95 kg/m  04/04/23 34.43 kg/m  03/16/23 34.78 kg/m     Vaccines:   HPV: N/A Tdap: Due Shingrix: up to date Pneumonia: N/A Flu: Due COVID-19: up to date   Hep C Screening: 09/18/18 STD testing and prevention (HIV/chl/gon/syphilis): 09/18/18 Intimate partner violence: negative screen  Sexual History : Menstrual History/LMP/Abnormal Bleeding:  Discussed importance of follow up if any post-menopausal bleeding: yes  Incontinence Symptoms: negative for symptoms   Breast cancer:  - Last Mammogram: 2023, due would like [facility] - BRCA  gene screening: N/A  Osteoporosis Prevention : Discussed high calcium and vitamin D supplementation, weight bearing exercises Bone density: 11/11/20   Cervical cancer screening: N/A  Skin cancer: Discussed monitoring for atypical lesions  Colorectal cancer: 11/08/22   Lung cancer:  Low Dose CT Chest recommended if Age 62-80 years, 20 pack-year currently smoking OR have quit w/in 15years. Patient does not qualify for screen   ECG: 08/03/23  Advanced Care Planning: A voluntary discussion about advance care planning including the explanation and discussion of advance directives.  Discussed health care proxy and Living will, and the patient was able to identify a health care proxy as N/A.  Patient does not have a living will and power of attorney of health care   Lipids: Lab Results  Component Value Date   CHOL 223 (H) 08/09/2021   CHOL 199 08/05/2020   CHOL 193 08/02/2019   Lab Results  Component Value Date   HDL 66 08/09/2021   HDL 56 08/05/2020   HDL 60 08/02/2019   Lab Results  Component Value Date   LDLCALC 135 (H) 08/09/2021   LDLCALC 120 (H) 08/05/2020   LDLCALC 116 (H) 08/02/2019   Lab Results  Component Value Date   TRIG 116 08/09/2021  TRIG 123 08/05/2020   TRIG 80 08/02/2019   Lab Results  Component Value Date   CHOLHDL 3.4 08/09/2021   CHOLHDL 3.6 08/05/2020   CHOLHDL 3.2 08/02/2019   No results found for: "LDLDIRECT"  Glucose: Glucose, Bld  Date Value Ref Range Status  04/20/2023 131 (H) 70 - 99 mg/dL Final    Comment:    Glucose reference range applies only to samples taken after fasting for at least 8 hours.  08/27/2021 83 70 - 99 mg/dL Final    Comment:    Glucose reference range applies only to samples taken after fasting for at least 8 hours.  08/09/2021 112 (H) 65 - 99 mg/dL Final    Comment:    .            Fasting reference interval . For someone without known diabetes, a glucose value between 100 and 125 mg/dL is consistent  with prediabetes and should be confirmed with a follow-up test. .     Patient Active Problem List   Diagnosis Date Noted   Encounter for screening colonoscopy 11/08/2022   Adenomatous polyp of descending colon 11/08/2022   Rectal polyp 11/08/2022   Chronic diastolic heart failure (HCC) 06/10/2022   Breast lump on right side at 10 o'clock position 06/10/2022   Circadian rhythm sleep disorder, shift work type 03/23/2021   Obstructive sleep apnea syndrome 03/23/2021   Attention and concentration deficit 01/20/2020   Alcohol use disorder, mild, in early remission 10/29/2019   Old tear of meniscus of right knee 03/20/2019   SOB (shortness of breath) 08/29/2017   Episodic tension-type headache, not intractable 12/20/2016   History of blood transfusion 07/30/2015   GAD (generalized anxiety disorder) 07/30/2015   Chronic constipation 07/13/2015   Insomnia 07/13/2015   MDD (major depressive disorder), recurrent episode, mild (HCC) 07/13/2015   Fibromyalgia syndrome 07/13/2015   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 Earnestine Leys at [facility]    CARDIAC CATHETERIZATION     CARPAL TUNNEL RELEASE Left 12/19/2010   CESAREAN SECTION     COLONOSCOPY WITH PROPOFOL N/A 11/08/2022   Procedure: COLONOSCOPY WITH PROPOFOL;  Surgeon: Toney Reil, MD;  Location: [facility] ENDOSCOPY;  Service: Gastroenterology;  Laterality: N/A;   LAPAROSCOPIC HYSTERECTOMY     followed by 3 cuff repairs   LIPOMA EXCISION  12/19/2010   REFRACTIVE SURGERY Bilateral    RIGHT/LEFT HEART CATH AND CORONARY ANGIOGRAPHY N/A 09/02/2021   Procedure: RIGHT/LEFT HEART CATH AND CORONARY ANGIOGRAPHY;  Surgeon: Dolores Patty, MD;  Location: [facility] INVASIVE CV LAB;  Service:  Cardiovascular;  Laterality: N/A;   TONSILLECTOMY      Family History  Problem Relation Age of Onset   Heart attack Father    Alcohol abuse Father    COPD Sister        end stages   Bipolar disorder Brother    Depression Brother    Bipolar disorder Brother    Depression Brother    Alcohol abuse Brother    Heart attack Paternal Uncle    Breast cancer Paternal Aunt    Breast cancer Maternal Aunt     Social History   Socioeconomic History   Marital status: Divorced    Spouse name: larry   Number  of children: 3   Years of education: Not on file   Highest education level: Bachelor's degree (e.g., BA, AB, BS)  Occupational History   Not on file  Tobacco Use   Smoking status: Never   Smokeless tobacco: Never  Vaping Use   Vaping status: Never Used  Substance and Sexual Activity   Alcohol use: Not Currently   Drug use: No   Sexual activity: Not Currently  Other Topics Concern   Not on file  Social History Narrative   She has been on disability since end of 2021   She has a grown son that has Down's syndrome that lives with her       Social Determinants of Health   Financial Resource Strain: Medium Risk (04/04/2023)   Overall Financial Resource Strain (CARDIA)    Difficulty of Paying Living Expenses: Somewhat hard  Food Insecurity: Food Insecurity Present (04/04/2023)   Hunger Vital Sign    Worried About Running Out of Food in the Last Year: Sometimes true    Ran Out of Food in the Last Year: Often true  Transportation Needs: No Transportation Needs (04/04/2023)   PRAPARE - Administrator, Civil Service (Medical): No    Lack of Transportation (Non-Medical): No  Physical Activity: Unknown (04/04/2023)   Exercise Vital Sign    Days of Exercise per Week: 0 days    Minutes of Exercise per Session: Not on file  Stress: Stress Concern Present (04/04/2023)   [employer] of Occupational Health - Occupational Stress Questionnaire    Feeling of Stress : Very  much  Social Connections: Socially Isolated (04/04/2023)   Social Connection and Isolation Panel [NHANES]    Frequency of Communication with Friends and Family: More than three times a week    Frequency of Social Gatherings with Friends and Family: Once a week    Attends Religious Services: Never    Database administrator or Organizations: No    Attends Engineer, structural: Not on file    Marital Status: Divorced  Intimate Partner Violence: Not At Risk (08/09/2021)   Humiliation, Afraid, Rape, and Kick questionnaire    Fear of Current or Ex-Partner: No    Emotionally Abused: No    Physically Abused: No    Sexually Abused: No     Current Outpatient Medications:    albuterol (VENTOLIN HFA) 108 (90 Base) MCG/ACT inhaler, TAKE 2 PUFFS BY MOUTH EVERY 6 HOURS AS NEEDED FOR WHEEZE OR SHORTNESS OF BREATH, Disp: 18 each, Rfl: 1   albuterol (VENTOLIN HFA) 108 (90 Base) MCG/ACT inhaler, Inhale 2 puffs into the lungs every 4 (four) hours as needed for wheezing or shortness of breath., Disp: 8 g, Rfl: 1   albuterol (VENTOLIN HFA) 108 (90 Base) MCG/ACT inhaler, INHALE 2 PUFFS INTO THE LUNGS EVERY 4 HOURS AS NEEDED FOR WHEEZING OR SHORTNESS OF BREATH., Disp: 8.5 each, Rfl: 2   AUVELITY 45-105 MG TBCR, Take by mouth., Disp: , Rfl:    baclofen (LIORESAL) 10 MG tablet, TAKE 1 TABLET BY MOUTH THREE TIMES A DAY AS NEEDED FOR MUSCLE SPASMS, Disp: 30 tablet, Rfl: 0   BD PEN NEEDLE MICRO U/F 32G X 6 MM MISC, USE AS DIRECTED DAILY AT 12 NOON, Disp: 100 each, Rfl: 1   benzonatate (TESSALON) 100 MG capsule, Take 1-2 capsules (100-200 mg total) by mouth 3 (three) times daily as needed for cough., Disp: 60 capsule, Rfl: 0   CAPLYTA 10.5 MG CAPS, Take 1 capsule by  mouth at bedtime., Disp: , Rfl:    CAPLYTA 42 MG capsule, Take 42 mg by mouth at bedtime., Disp: , Rfl:    furosemide (LASIX) 20 MG tablet, TAKE 1 TABLET (20 MG TOTAL) BY MOUTH AS NEEDED FOR FLUID OR EDEMA., Disp: 90 tablet, Rfl: 0    Glucos-Chond-Hyal Ac-Ca Fructo (MOVE FREE JOINT HEALTH ADVANCE PO), Take 1 tablet by mouth daily., Disp: , Rfl:    Glycerin-Hypromellose-PEG 400 (ARTIFICIAL TEARS) 0.2-0.2-1 % SOLN, Place 1 drop into both eyes daily as needed (dry eyes)., Disp: , Rfl:    hydrOXYzine (VISTARIL) 25 MG capsule, Take 25 mg by mouth 3 (three) times daily., Disp: , Rfl:    KLOR-CON M20 20 MEQ tablet, TAKE 1 TABLET (20 MEQ TOTAL) BY MOUTH DAILY AS NEEDED. WHEN YOU TAKE LASIX, Disp: 90 tablet, Rfl: 0   LORazepam (ATIVAN) 1 MG tablet, Take 0.5-1 mg by mouth daily as needed for anxiety., Disp: , Rfl:    meloxicam (MOBIC) 15 MG tablet, Take 15 mg by mouth daily., Disp: , Rfl:    Menthol, Topical Analgesic, (BIOFREEZE) 4 % GEL, Apply 1 application topically daily as needed (pain)., Disp: , Rfl:    modafinil (PROVIGIL) 200 MG tablet, Take 1 tablet (200 mg total) by mouth daily., Disp: 90 tablet, Rfl: 0   Multiple Vitamin (MULTIVITAMIN) tablet, Take 1 tablet by mouth daily., Disp: , Rfl:    ondansetron (ZOFRAN-ODT) 4 MG disintegrating tablet, Take 1 tablet (4 mg total) by mouth every 8 (eight) hours as needed for nausea or vomiting., Disp: 15 tablet, Rfl: 0   pregabalin (LYRICA) 50 MG capsule, Take 50 mg by mouth 3 (three) times daily., Disp: , Rfl:    terbinafine (LAMISIL) 250 MG tablet, Take 1 tablet (250 mg total) by mouth daily., Disp: 90 tablet, Rfl: 0   traZODone (DESYREL) 50 MG tablet, Take 50-150 mg by mouth at bedtime as needed., Disp: , Rfl:    triamcinolone acetonide (KENALOG-40) 40 MG/ML injection, SMARTSIG:2 Milliliter(s) IM, Disp: , Rfl:    valACYclovir (VALTREX) 500 MG tablet, TAKE 1 TABLET (500 MG TOTAL) BY MOUTH DAILY. AND TWICE DAILY FOR OUTBREAKS, Disp: 115 tablet, Rfl: 1   zinc gluconate 50 MG tablet, Take 50 mg by mouth daily., Disp: , Rfl:   No Known Allergies   ROS  ***  Objective  There were no vitals filed for this visit.  There is no height or weight on file to calculate BMI.  Physical  Exam ***  No results found for this or any previous visit (from the past 2160 hour(s)).   Fall Risk:    06/29/2023    8:40 AM 04/04/2023   10:36 AM 02/20/2023    7:55 AM 10/17/2022    3:30 PM 06/10/2022   11:21 AM  Fall Risk   Falls in the past year? 0 0 0 1 0  Number falls in past yr: 0 0 0 1 0  Injury with Fall? 0 0 0 1 0  Risk for fall due to : No Fall Risks No Fall Risks No Fall Risks History of fall(s) No Fall Risks  Follow up Falls prevention discussed;Education provided;Falls evaluation completed Falls prevention discussed;Education provided;Falls evaluation completed Falls prevention discussed Falls prevention discussed;Education provided;Falls evaluation completed Falls prevention discussed     Functional Status Survey:     Assessment & Plan  1. Well adult exam ***   -USPSTF grade A and B recommendations reviewed with patient; age-appropriate recommendations, preventive care, screening tests, etc discussed and  encouraged; healthy living encouraged; see AVS for patient education given to patient -Discussed importance of 150 minutes of physical activity weekly, eat two servings of fish weekly, eat one serving of tree nuts ( cashews, pistachios, pecans, almonds.Marland Kitchen) every other day, eat 6 servings of fruit/vegetables daily and drink plenty of water and avoid sweet beverages.   -Reviewed Health Maintenance: Yes.

## 2023-08-27 LAB — LIPID PANEL
Cholesterol: 216 mg/dL — ABNORMAL HIGH (ref <200)
HDL: 62 mg/dL (ref >=50)
LDL Cholesterol (Calc): 131 mg/dL — ABNORMAL HIGH
Non-HDL Cholesterol (Calc): 154 mg/dL — ABNORMAL HIGH (ref <130)
Total CHOL/HDL Ratio: 3.5 (calc) (ref <5.0)
Triglycerides: 124 mg/dL (ref <150)

## 2023-08-27 LAB — CBC WITH DIFFERENTIAL/PLATELET
Absolute Monocytes: 409 {cells}/uL (ref 200–950)
Basophils Absolute: 43 {cells}/uL (ref 0–200)
Basophils Relative: 0.7 %
Eosinophils Absolute: 110 {cells}/uL (ref 15–500)
Eosinophils Relative: 1.8 %
HCT: 40.8 % (ref 35.0–45.0)
Hemoglobin: 14.1 g/dL (ref 11.7–15.5)
Lymphs Abs: 2007 {cells}/uL (ref 850–3900)
MCH: 30 pg (ref 27.0–33.0)
MCHC: 34.6 g/dL (ref 32.0–36.0)
MCV: 86.8 fL (ref 80.0–100.0)
MPV: 12.3 fL (ref 7.5–12.5)
Monocytes Relative: 6.7 %
Neutro Abs: 3532 {cells}/uL (ref 1500–7800)
Neutrophils Relative %: 57.9 %
Platelets: 206 10*3/uL (ref 140–400)
RBC: 4.7 10*6/uL (ref 3.80–5.10)
RDW: 14.2 % (ref 11.0–15.0)
Total Lymphocyte: 32.9 %
WBC: 6.1 10*3/uL (ref 3.8–10.8)

## 2023-08-27 LAB — ANTI-NUCLEAR AB-TITER (ANA TITER): ANA Titer 1: 1:80 {titer} — ABNORMAL HIGH

## 2023-08-27 LAB — ANA,IFA RA DIAG PNL W/RFLX TIT/PATN
Anti Nuclear Antibody (ANA): POSITIVE — AB
Cyclic Citrullin Peptide Ab: <16 U
Rheumatoid fact SerPl-aCnc: <10 [IU]/mL (ref <14)

## 2023-08-27 LAB — HEMOGLOBIN A1C
Hgb A1c MFr Bld: 5.8 %{Hb} — ABNORMAL HIGH (ref <5.7)
Mean Plasma Glucose: 120 mg/dL
eAG (mmol/L): 6.6 mmol/L

## 2023-08-27 LAB — COMPLETE METABOLIC PANEL WITH GFR
AG Ratio: 1.6 (calc) (ref 1.0–2.5)
ALT: 16 U/L (ref 6–29)
AST: 16 U/L (ref 10–35)
Albumin: 4.1 g/dL (ref 3.6–5.1)
Alkaline phosphatase (APISO): 78 U/L (ref 37–153)
BUN: 15 mg/dL (ref 7–25)
CO2: 29 mmol/L (ref 20–32)
Calcium: 9.4 mg/dL (ref 8.6–10.4)
Chloride: 103 mmol/L (ref 98–110)
Creat: 0.92 mg/dL (ref 0.50–1.05)
Globulin: 2.6 g/dL (ref 1.9–3.7)
Glucose, Bld: 102 mg/dL — ABNORMAL HIGH (ref 65–99)
Potassium: 4.7 mmol/L (ref 3.5–5.3)
Sodium: 140 mmol/L (ref 135–146)
Total Bilirubin: 0.7 mg/dL (ref 0.2–1.2)
Total Protein: 6.7 g/dL (ref 6.1–8.1)
eGFR: 70 mL/min/{1.73_m2} (ref >=60)

## 2023-08-27 LAB — C-REACTIVE PROTEIN: CRP: 4.2 mg/L (ref <8.0)

## 2023-08-27 LAB — SEDIMENTATION RATE: Sed Rate: 11 mm/h (ref 0–30)

## 2023-08-27 LAB — VITAMIN D 25 HYDROXY (VIT D DEFICIENCY, FRACTURES): Vit D, 25-Hydroxy: 24 ng/mL — ABNORMAL LOW (ref 30–100)

## 2023-08-27 LAB — TSH: TSH: 1.63 m[IU]/L (ref 0.40–4.50)

## 2023-08-28 ENCOUNTER — Ambulatory Visit
Admission: RE | Admit: 2023-08-28 | Discharge: 2023-08-28 | Disposition: A | Discharge status: Home / Self Care | Payer: BC Managed Care – PPO | Source: Ambulatory Visit | Attending: Family Medicine | Admitting: Family Medicine

## 2023-08-28 ENCOUNTER — Other Ambulatory Visit: Payer: Self-pay | Admitting: Family Medicine

## 2023-08-28 DIAGNOSIS — E049 Nontoxic goiter, unspecified: Secondary | ICD-10-CM | POA: Insufficient documentation

## 2023-08-28 DIAGNOSIS — R768 Other specified abnormal immunological findings in serum: Secondary | ICD-10-CM

## 2023-08-28 DIAGNOSIS — M2550 Pain in unspecified joint: Secondary | ICD-10-CM

## 2023-08-28 DIAGNOSIS — Z8261 Family history of arthritis: Secondary | ICD-10-CM

## 2023-08-28 LAB — HM MAMMOGRAPHY

## 2023-08-28 NOTE — Telephone Encounter (Signed)
Copied from CRM [id number]. Topic: Referral - Question >> Aug 28, 2023  9:33 AM Everette C wrote: Reason for CRM: The patient has returned missed call from practice regarding their previously discussed referral   The patient would like to be referred to a facility in [facility] for their Rheumatology   Please contact further when possible

## 2023-08-29 ENCOUNTER — Other Ambulatory Visit: Payer: Self-pay | Admitting: Family Medicine

## 2023-08-29 ENCOUNTER — Ambulatory Visit: Payer: Self-pay | Admitting: *Deleted

## 2023-08-29 DIAGNOSIS — E041 Nontoxic single thyroid nodule: Secondary | ICD-10-CM

## 2023-08-29 NOTE — Telephone Encounter (Signed)
  Chief Complaint: Pt returned call and was given the U/S result message from Dr. Carlynn Purl. Symptoms: N/A Frequency: N/A Pertinent Negatives: Patient denies N/A Disposition: [] ED /[] Urgent Care (no appt availability in office) / [] Appointment(In office/virtual)/ []  [facility]/ [] Home Care/ [] Refused Recommended Disposition /[] [place] [facility]/ [x]  Follow-up with PCP Additional Notes: Pt has questions regarding this.   She is ok with the referral to the [facility].    What kind of nodule?   Is it cancer?   Are they doing a biopsy to see what it is?   She has questions.

## 2023-08-29 NOTE — Telephone Encounter (Signed)
Called patient and answered all questions. Patient verbalized understanding.

## 2023-08-29 NOTE — Progress Notes (Unsigned)
Name: Teresa Gallagher   MRN: [id number]    DOB: 22-Jul-1961   Date:08/30/2023       Progress Note  Subjective  Chief Complaint  Weight Loss  HPI  Sleep Apnea: she was diagnosed with OSA in 2016. Reviewed the last  study with her done in 2021 , she states she had an appointment with Dr. Earl Gala - sleep medicine  still take modafinil to help with daytime somnolence and needs a refill    SOB/chronic CHF diastolic : seen by pulmonologist and cardiologist, wearing CPAP and using lasix and SOB has improved.  She has some lower extremity edema intermittently , she has mild orthopnea . She had a cath done by Dr. Gala Romney and had cardiac cath 09/22 normal EKG . She sees him once a year    GAD/Major  recurrent depression: she saw Dr. Elna Breslow for the first time Nov 10 th, 2020. But switched to Dr. Evelene Croon since Nov 2021 She is on long term disability since 2023. She is not sure of the medications she is currently taking and will send me the names through my chart    Morbid obesity  BMI is above 35 with co-morbidities such as   OSA, OA and dyslipidemia, pre diabetes . She tried GLP-1 agonist and it worked for her but due to [employer] and in change in insurance coverage she ran out of medications April 2024. She has been able to maintain her weight in the 2 teens range. She is currently monitoring her portions, she has joint pains and is going to resume water aerobics . Explained she must lose 5 % of her weight in the next 3 months . Weight goal for Dec is 202 lbs    Dyslipidemia: not on medication discussed healthy diet   The 10-year ASCVD risk score (Arnett DK, et al., 2019) is: 5.1%   Values used to calculate the score:     Age: 62 years     Sex: Female     Is Non-Hispanic African American: Yes     Diabetic: No     Tobacco smoker: No     Systolic Blood Pressure: 118 mmHg     Is BP treated: No     HDL Cholesterol: 62 mg/dL     Total Cholesterol: 216 mg/dL   Pre-diabetes: last E9B up to 5.8  % , denies polyphagia, polydipsia or polyuria  Arthralgia: she has recurrent neck, low back pain, shoulder and hip pains, family history of RA , we checked labs and positive ANA, waiting to see Rheumatologist   Thyroid nodule: she has a referral to see Endo and get it biopsied   Patient Active Problem List   Diagnosis Date Noted   Encounter for screening colonoscopy 11/08/2022   Adenomatous polyp of descending colon 11/08/2022   Rectal polyp 11/08/2022   Chronic diastolic heart failure (HCC) 06/10/2022   Breast lump on right side at 10 o'clock position 06/10/2022   Circadian rhythm sleep disorder, shift work type 03/23/2021   Obstructive sleep apnea syndrome 03/23/2021   Attention and concentration deficit 01/20/2020   Alcohol use disorder, mild, in early remission 10/29/2019   Old tear of meniscus of right knee 03/20/2019   SOB (shortness of breath) 08/29/2017   Episodic tension-type headache, not intractable 12/20/2016   History of blood transfusion 07/30/2015   GAD (generalized anxiety disorder) 07/30/2015   Chronic constipation 07/13/2015   Insomnia 07/13/2015   MDD (major depressive disorder), recurrent episode, mild (HCC) 07/13/2015  Fibromyalgia syndrome 07/13/2015   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 Earnestine [facility]ys at [facility]    CARDIAC CATHETERIZATION     CARPAL TUNNEL RELEASE Left 12/19/2010   CESAREAN SECTION     COLONOSCOPY WITH PROPOFOL N/A 11/08/2022   Procedure: COLONOSCOPY WITH PROPOFOL;  Surgeon: Toney Reil, MD;  Location: [facility] ENDOSCOPY;  Service: Gastroenterology;  Laterality: N/A;   LAPAROSCOPIC HYSTERECTOMY     followed by 3 cuff repairs   LIPOMA EXCISION  12/19/2010   REFRACTIVE SURGERY Bilateral     RIGHT/LEFT HEART CATH AND CORONARY ANGIOGRAPHY N/A 09/02/2021   Procedure: RIGHT/LEFT HEART CATH AND CORONARY ANGIOGRAPHY;  Surgeon: Dolores Patty, MD;  Location: [facility] INVASIVE CV LAB;  Service: Cardiovascular;  Laterality: N/A;   TONSILLECTOMY      Family History  Problem Relation Age of Onset   Heart attack Father    Alcohol abuse Father    COPD Sister        end stages   Bipolar disorder Brother    Depression Brother    Bipolar disorder Brother    Depression Brother    Alcohol abuse Brother    Heart attack Paternal Uncle    Breast cancer Paternal Aunt    Breast cancer Maternal Aunt     Social History   Tobacco Use   Smoking status: Never   Smokeless tobacco: Never  Substance Use Topics   Alcohol use: Not Currently     Current Outpatient Medications:    AUVELITY 45-105 MG TBCR, Take by mouth., Disp: , Rfl:    BD PEN NEEDLE MICRO U/F 32G X 6 MM MISC, USE AS DIRECTED DAILY AT 12 NOON, Disp: 100 each, Rfl: 1   furosemide (LASIX) 20 MG tablet, TAKE 1 TABLET (20 MG TOTAL) BY MOUTH AS NEEDED FOR FLUID OR EDEMA., Disp: 30 tablet, Rfl: 0   Glucos-Chond-Hyal Ac-Ca Fructo (MOVE FREE JOINT HEALTH ADVANCE PO), Take 1 tablet by mouth daily., Disp: , Rfl:    Glycerin-Hypromellose-PEG 400 (ARTIFICIAL TEARS) 0.2-0.2-1 % SOLN, Place 1 drop into both eyes daily as needed (dry eyes)., Disp: , Rfl:    hydrOXYzine (VISTARIL) 25 MG capsule, Take 25 mg by mouth 3 (three) times daily., Disp: , Rfl:    meloxicam (MOBIC) 15 MG tablet, Take 15 mg by mouth daily., Disp: , Rfl:    Menthol, Topical Analgesic, (BIOFREEZE) 4 % GEL, Apply 1 application topically daily as needed (pain)., Disp: , Rfl:    modafinil (PROVIGIL) 200 MG tablet, Take 1 tablet (200 mg total) by mouth daily., Disp: 90 tablet, Rfl: 0   Multiple Vitamin (MULTIVITAMIN) tablet, Take 1 tablet by mouth daily., Disp: , Rfl:    potassium chloride SA (KLOR-CON M20) 20 MEQ tablet, TAKE 1 TABLET (20 MEQ TOTAL) BY MOUTH DAILY AS NEEDED.  WHEN YOU TAKE LASIX, Disp: 30 tablet, Rfl: 0   traZODone (DESYREL) 50 MG tablet, Take 50-150 mg by mouth at bedtime as needed., Disp: , Rfl:    valACYclovir (VALTREX) 500 MG tablet, TAKE 1 TABLET (500 MG TOTAL) BY MOUTH DAILY. AND TWICE DAILY FOR OUTBREAKS, Disp: 115 tablet, Rfl: 1   zinc gluconate 50 MG tablet, Take 50 mg by mouth daily., Disp: , Rfl:    baclofen (LIORESAL) 10  MG tablet, TAKE 1 TABLET BY MOUTH THREE TIMES A DAY AS NEEDED FOR MUSCLE SPASMS (Patient not taking: Reported on 08/23/2023), Disp: 30 tablet, Rfl: 0   CAPLYTA 10.5 MG CAPS, Take 1 capsule by mouth at bedtime. (Patient not taking: Reported on 08/23/2023), Disp: , Rfl:    LORazepam (ATIVAN) 1 MG tablet, Take 0.5-1 mg by mouth daily as needed for anxiety. (Patient not taking: Reported on 08/23/2023), Disp: , Rfl:    pregabalin (LYRICA) 50 MG capsule, Take 50 mg by mouth 3 (three) times daily. (Patient not taking: Reported on 08/23/2023), Disp: , Rfl:   No Known Allergies  I personally reviewed active problem list, medication list, allergies, family history, social history, health maintenance with the patient/caregiver today.   ROS  Ten systems reviewed and is negative except as mentioned in HPI    Objective  Vitals:   08/30/23 0833  BP: 118/72  Pulse: 92  Resp: 16  SpO2: 96%  Weight: 214 lb (97.1 kg)  Height: 5\' 5"  (1.651 m)    Body mass index is 35.61 kg/m.  Physical Exam  Constitutional: Patient appears well-developed and well-nourished. Obese  No distress.  HEENT: head atraumatic, normocephalic, pupils equal and reactive to light, neck supple Cardiovascular: Normal rate, regular rhythm and normal heart sounds.  No murmur heard. No BLE edema. Pulmonary/Chest: Effort normal and breath sounds normal. No respiratory distress. Abdominal: Soft.  There is no tenderness. Psychiatric: Patient has a normal mood and affect. behavior is normal. Judgment and thought content normal.    PHQ2/9:    08/30/2023    8:36 AM  08/23/2023    8:27 AM 06/29/2023    8:40 AM 04/04/2023   10:36 AM 02/20/2023    9:29 AM  Depression screen PHQ 2/9  Decreased Interest 3 3 2 3 3   Down, Depressed, Hopeless 2 2 2 2 3   PHQ - 2 Score 5 5 4 5 6   Altered sleeping 2 2 2 2 1   Tired, decreased energy 3 3 2 2 3   Change in appetite 3 3 2  0 0  Feeling bad or failure about yourself  3 2 1  0 3  Trouble concentrating 2 3 0 0 3  Moving slowly or fidgety/restless 0 0 0 0 0  Suicidal thoughts 0 0 0 0 0  PHQ-9 Score 18 18 11 9 16   Difficult doing work/chores  Extremely dIfficult Somewhat difficult Somewhat difficult     phq 9 is positive   Fall Risk:    08/30/2023    8:32 AM 06/29/2023    8:40 AM 04/04/2023   10:36 AM 02/20/2023    7:55 AM 10/17/2022    3:30 PM  Fall Risk   Falls in the past year? 1 0 0 0 1  Number falls in past yr: 0 0 0 0 1  Injury with Fall? 0 0 0 0 1  Risk for fall due to : No Fall Risks No Fall Risks No Fall Risks No Fall Risks History of fall(s)  Follow up Falls prevention discussed Falls prevention discussed;Education provided;Falls evaluation completed Falls prevention discussed;Education provided;Falls evaluation completed Falls prevention discussed Falls prevention discussed;Education provided;Falls evaluation completed      Functional Status Survey: Is the patient deaf or have difficulty hearing?: No Does the patient have difficulty seeing, even when wearing glasses/contacts?: No Does the patient have difficulty concentrating, remembering, or making decisions?: Yes Does the patient have difficulty walking or climbing stairs?: Yes Does the patient have difficulty dressing or bathing?: No  Does the patient have difficulty doing errands alone such as visiting a doctor's office or shopping?: No    Assessment & Plan  1. Sleep apnea with hypersomnolence  - modafinil (PROVIGIL) 200 MG tablet; Take 1 tablet (200 mg total) by mouth daily.  Dispense: 90 tablet; Refill: 0  2. Morbid obesity (HCC)  -  Semaglutide-Weight Management 0.25 MG/0.5ML SOAJ; Inject 0.25 mg into the skin once a week for 28 days.  Dispense: 2 mL; Refill: 0 - Semaglutide-Weight Management 0.5 MG/0.5ML SOAJ; Inject 0.5 mg into the skin once a week for 28 days.  Dispense: 2 mL; Refill: 0 - Semaglutide-Weight Management 1 MG/0.5ML SOAJ; Inject 1 mg into the skin once a week for 28 days.  Dispense: 2 mL; Refill: 0  3. Chronic diastolic heart failure (HCC)  Under the care of cardiologist   4. Thyroid nodule  Keep appointment with Endo   5. Elevated antinuclear antibody (ANA) level   6. Dyslipidemia  Reviewed results   7. Pre-diabetes   Discussed low carb diet

## 2023-08-29 NOTE — Telephone Encounter (Signed)
Reason for Disposition  [1] Caller requesting NON-URGENT health information AND [2] PCP's office is the best resource  Answer Assessment - Initial Assessment Questions 1. REASON FOR CALL or QUESTION: "What is your reason for calling today?" or "How can I best help you?" or "What question do you have that I can help answer?"     Pt returned call and was given the ultrasound message from Dr. Carlynn Purl dated 08/29/2023 at 12:45 PM. She has questions.   What kind of nodule?    What will they be doing to the nodule?  Is it cancer or are they doing a biopsy?   Please contact pt as she has questions.  Protocols used: Information Only Call - No Triage-A-AH

## 2023-08-30 ENCOUNTER — Other Ambulatory Visit: Payer: Self-pay

## 2023-08-30 ENCOUNTER — Ambulatory Visit: Payer: BC Managed Care – PPO | Admitting: Family Medicine

## 2023-08-30 ENCOUNTER — Encounter: Payer: Self-pay | Admitting: Family Medicine

## 2023-08-30 DIAGNOSIS — R768 Other specified abnormal immunological findings in serum: Secondary | ICD-10-CM

## 2023-08-30 DIAGNOSIS — E785 Hyperlipidemia, unspecified: Secondary | ICD-10-CM

## 2023-08-30 DIAGNOSIS — E041 Nontoxic single thyroid nodule: Secondary | ICD-10-CM

## 2023-08-30 DIAGNOSIS — G4710 Hypersomnia, unspecified: Secondary | ICD-10-CM

## 2023-08-30 DIAGNOSIS — R7303 Prediabetes: Secondary | ICD-10-CM

## 2023-08-30 DIAGNOSIS — I5032 Chronic diastolic (congestive) heart failure: Secondary | ICD-10-CM

## 2023-08-30 DIAGNOSIS — G4730 Sleep apnea, unspecified: Secondary | ICD-10-CM

## 2023-08-30 MED ORDER — MODAFINIL 200 MG PO TABS
200.0000 mg | ORAL_TABLET | Freq: Every day | ORAL | 0 refills | Status: DC
Start: 2023-08-30 — End: 2023-11-29
  Filled 2023-08-30: qty 30, 30d supply, fill #0

## 2023-08-30 MED ORDER — SEMAGLUTIDE-WEIGHT MANAGEMENT 0.25 MG/0.5ML ~~LOC~~ SOAJ
0.2500 mg | SUBCUTANEOUS | 0 refills | Status: AC
Start: 2023-08-30 — End: 2023-09-27
  Filled 2023-08-30: qty 2, 28d supply, fill #0

## 2023-08-30 MED ORDER — SEMAGLUTIDE-WEIGHT MANAGEMENT 1 MG/0.5ML ~~LOC~~ SOAJ
1.0000 mg | SUBCUTANEOUS | 0 refills | Status: DC
Start: 2023-10-27 — End: 2023-11-29
  Filled 2023-08-30 – 2023-10-17 (×2): qty 2, 28d supply, fill #0

## 2023-08-30 MED ORDER — SEMAGLUTIDE-WEIGHT MANAGEMENT 0.5 MG/0.5ML ~~LOC~~ SOAJ
0.5000 mg | SUBCUTANEOUS | 0 refills | Status: AC
Start: 2023-09-28 — End: 2023-10-26
  Filled 2023-08-30 – 2023-09-25 (×3): qty 2, 28d supply, fill #0

## 2023-08-30 NOTE — Patient Instructions (Addendum)
[facility] Endocrinology and Rheumatology #: [phone]

## 2023-08-31 ENCOUNTER — Telehealth: Payer: Self-pay | Admitting: Family Medicine

## 2023-08-31 ENCOUNTER — Other Ambulatory Visit: Payer: Self-pay

## 2023-08-31 DIAGNOSIS — E041 Nontoxic single thyroid nodule: Secondary | ICD-10-CM

## 2023-08-31 NOTE — Telephone Encounter (Signed)
Referral placed.

## 2023-08-31 NOTE — Telephone Encounter (Signed)
Copied from CRM [id number]. Topic: General - Other >> Aug 31, 2023 10:00 AM Turkey B wrote: Reason for CRM: pt called in wants a referral for [facility] instead of [facility]

## 2023-09-07 ENCOUNTER — Other Ambulatory Visit: Payer: Self-pay

## 2023-09-07 DIAGNOSIS — R768 Other specified abnormal immunological findings in serum: Secondary | ICD-10-CM

## 2023-09-07 NOTE — Telephone Encounter (Signed)
Returned call and clarified reason for rheumatology referral.

## 2023-09-07 NOTE — Telephone Encounter (Signed)
Copied from CRM [id number]. Topic: Referral - Question >> Sep 07, 2023 12:52 PM Phill Myron wrote: Please call [facility].Marland KitchenMarland KitchenRegarding the Referral placed. Clarification is needed, so that we don't delay the patients needs.    [phone]

## 2023-09-20 ENCOUNTER — Other Ambulatory Visit: Payer: Self-pay

## 2023-09-21 ENCOUNTER — Other Ambulatory Visit: Payer: Self-pay | Admitting: Family Medicine

## 2023-09-21 DIAGNOSIS — I5032 Chronic diastolic (congestive) heart failure: Secondary | ICD-10-CM

## 2023-09-23 ENCOUNTER — Other Ambulatory Visit: Payer: Self-pay | Admitting: Family Medicine

## 2023-09-23 DIAGNOSIS — B009 Herpesviral infection, unspecified: Secondary | ICD-10-CM

## 2023-09-25 ENCOUNTER — Other Ambulatory Visit: Payer: Self-pay

## 2023-10-17 ENCOUNTER — Other Ambulatory Visit: Payer: Self-pay

## 2023-10-19 ENCOUNTER — Other Ambulatory Visit: Payer: Self-pay

## 2023-11-20 ENCOUNTER — Other Ambulatory Visit: Payer: Self-pay

## 2023-11-20 ENCOUNTER — Other Ambulatory Visit: Payer: Self-pay | Admitting: Family Medicine

## 2023-11-21 ENCOUNTER — Other Ambulatory Visit: Payer: Self-pay

## 2023-11-21 ENCOUNTER — Other Ambulatory Visit: Payer: Self-pay | Admitting: Family Medicine

## 2023-11-21 NOTE — Progress Notes (Signed)
Name: Teresa Gallagher   MRN: [id number]    DOB: 1961/10/11   Date:11/29/2023       Progress Note  Subjective  Chief Complaint  Chief Complaint  Patient presents with   Medical Management of Chronic Issues    HPI  Discussed the use of AI scribe software for clinical note transcription with the patient, who gave verbal consent to proceed.  History of Present Illness   The patient, with a history of morbid obesity, sleep apnea, arthritis, dyslipidemia, and prediabetes, presents for a follow-up visit after initiating Wegovy treatment in September. The patient reports no side effects from the medication and has experienced significant weight loss, with a reduction in BMI from 35.61  to 34.9. The patient also reports improved A1c levels, decreasing from 5.8 to 5.6 in three months.  The patient has been experiencing joint aches and was referred to a rheumatologist due to an elevated ANA. However, the appointment was cancelled and has not been rescheduled. The patient also reports a history of onychomycosis , for which she completed a course of treatment in March - given by podiatrist - Dr Allena Katz .  The patient has a history of major depression and generalized anxiety disorder, which led to cessation of work. The patient reports not currently taking any antidepressants due to insurance issues and has not seen her psychiatrist since April. The patient also reports difficulty sleeping and would like a refill of Trazodone until she can see her psychiatrist , Dr. Maryruth Bun. Explained importance of scheduling the visit since her depression score is high.   The patient has been diagnosed with diastolic congestive heart failure and has seen a pulmonologist and cardiologist in the past. The patient uses a CPAP machine for sleep apnea, which was diagnosed in 2016. 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   Obstructive sleep apnea syndrome 03/23/2021   Attention and concentration deficit 01/20/2020   Alcohol use disorder, mild, in early remission 10/29/2019   Old tear of meniscus of right knee 03/20/2019   SOB (shortness of breath) 08/29/2017   Episodic tension-type headache, not intractable 12/20/2016   History of blood transfusion 07/30/2015   GAD (generalized anxiety disorder) 07/30/2015   Chronic constipation 07/13/2015   Insomnia 07/13/2015   MDD (major depressive disorder), recurrent episode, mild (HCC) 07/13/2015   Fibromyalgia syndrome 07/13/2015   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 Earnestine Leys at [facility]    CARDIAC CATHETERIZATION     CARPAL TUNNEL RELEASE Left 12/19/2010   CESAREAN SECTION     COLONOSCOPY WITH PROPOFOL N/A 11/08/2022   Procedure: COLONOSCOPY WITH PROPOFOL;  Surgeon: Toney Reil, MD;  Location: [facility] ENDOSCOPY;  Service: Gastroenterology;  Laterality: N/A;   LAPAROSCOPIC HYSTERECTOMY     followed by 3 cuff repairs  LIPOMA EXCISION  12/19/2010   REFRACTIVE SURGERY Bilateral    RIGHT/LEFT HEART CATH AND CORONARY ANGIOGRAPHY N/A 09/02/2021   Procedure: RIGHT/LEFT HEART CATH AND CORONARY ANGIOGRAPHY;  Surgeon: Dolores Patty, MD;  Location: [facility] INVASIVE CV LAB;  Service: Cardiovascular;  Laterality: N/A;   TONSILLECTOMY      Family History  Problem Relation Age of  Onset   Heart attack Father    Alcohol abuse Father    COPD Sister        end stages   Bipolar disorder Brother    Depression Brother    Bipolar disorder Brother    Depression Brother    Alcohol abuse Brother    Heart attack Paternal Uncle    Breast cancer Paternal Aunt    Breast cancer Maternal Aunt     Social History   Tobacco Use   Smoking status: Never   Smokeless tobacco: Never  Substance Use Topics   Alcohol use: Not Currently     Current Outpatient Medications:    AUVELITY 45-105 MG TBCR, Take by mouth., Disp: , Rfl:    baclofen (LIORESAL) 10 MG tablet, TAKE 1 TABLET BY MOUTH THREE TIMES A DAY AS NEEDED FOR MUSCLE SPASMS, Disp: 30 tablet, Rfl: 0   furosemide (LASIX) 20 MG tablet, TAKE 1 TABLET (20 MG TOTAL) BY MOUTH AS NEEDED FOR FLUID OR EDEMA., Disp: 30 tablet, Rfl: 0   Glucos-Chond-Hyal Ac-Ca Fructo (MOVE FREE JOINT HEALTH ADVANCE PO), Take 1 tablet by mouth daily., Disp: , Rfl:    Glycerin-Hypromellose-PEG 400 (ARTIFICIAL TEARS) 0.2-0.2-1 % SOLN, Place 1 drop into both eyes daily as needed (dry eyes)., Disp: , Rfl:    hydrOXYzine (VISTARIL) 25 MG capsule, Take 25 mg by mouth 3 (three) times daily., Disp: , Rfl:    LORazepam (ATIVAN) 1 MG tablet, Take 0.5-1 mg by mouth daily as needed for anxiety., Disp: , Rfl:    meloxicam (MOBIC) 15 MG tablet, Take 15 mg by mouth daily., Disp: , Rfl:    Menthol, Topical Analgesic, (BIOFREEZE) 4 % GEL, Apply 1 application topically daily as needed (pain)., Disp: , Rfl:    Multiple Vitamin (MULTIVITAMIN) tablet, Take 1 tablet by mouth daily., Disp: , Rfl:    potassium chloride SA (KLOR-CON M20) 20 MEQ tablet, TAKE 1 TABLET (20 MEQ TOTAL) BY MOUTH DAILY AS NEEDED. WHEN YOU TAKE LASIX, Disp: 30 tablet, Rfl: 0   pregabalin (LYRICA) 50 MG capsule, Take 50 mg by mouth 3 (three) times daily., Disp: , Rfl:    Semaglutide-Weight Management (WEGOVY) 1.7 MG/0.75ML SOAJ, Inject 1.7 mg into the skin once a week., Disp: 3 mL, Rfl: 0    Semaglutide-Weight Management (WEGOVY) 2.4 MG/0.75ML SOAJ, Inject 2.4 mg into the skin once a week., Disp: 9 mL, Rfl: 0   valACYclovir (VALTREX) 500 MG tablet, TAKE 1 TABLET (500 MG TOTAL) BY MOUTH DAILY. AND TWICE DAILY FOR OUTBREAKS, Disp: 180 tablet, Rfl: 1   zinc gluconate 50 MG tablet, Take 50 mg by mouth daily., Disp: , Rfl:    CAPLYTA 10.5 MG CAPS, Take 1 capsule by mouth at bedtime. (Patient not taking: Reported on 08/23/2023), Disp: , Rfl:    modafinil (PROVIGIL) 200 MG tablet, Take 1 tablet (200 mg total) by mouth daily., Disp: 90 tablet, Rfl: 0   traZODone (DESYREL) 50 MG tablet, Take 1-3 tablets (50-150 mg total) by mouth at bedtime as needed., Disp: 30 tablet, Rfl: 0  No Known Allergies  I personally reviewed active problem list, medication list, allergies,  family history with the patient/caregiver today.   ROS  Ten systems reviewed and is negative except as mentioned in HPI    Objective  Vitals:   11/29/23 1409 11/29/23 1450  BP: 118/82   Pulse: (!) 114 88  Resp: 16   Temp: 98.1 F (36.7 C)   TempSrc: Oral   SpO2: 97%   Weight: 205 lb 6.4 oz (93.2 kg)   Height: 5\' 5"  (1.651 m)     Body mass index is 34.18 kg/m.  Physical Exam  Constitutional: Patient appears well-developed and well-nourished. Obese  No distress.  HEENT: head atraumatic, normocephalic, pupils equal and reactive to light, neck supple Cardiovascular: Normal rate, regular rhythm and normal heart sounds.  No murmur heard. No BLE edema. Pulmonary/Chest: Effort normal and breath sounds normal. No respiratory distress. Abdominal: Soft.  There is no tenderness. Psychiatric: Patient has a normal mood and affect. behavior is normal. Judgment and thought content normal.   Recent Results (from the past 2160 hour(s))  POCT glycosylated hemoglobin (Hb A1C)     Status: None   Collection Time: 11/29/23  2:12 PM  Result Value Ref Range   Hemoglobin A1C 5.6 4.0 - 5.6 %   HbA1c POC (<> result, manual entry)      HbA1c, POC (prediabetic range)     HbA1c, POC (controlled diabetic range)       PHQ2/9:    11/29/2023    1:58 PM 08/30/2023    8:36 AM 08/23/2023    8:27 AM 06/29/2023    8:40 AM 04/04/2023   10:36 AM  Depression screen PHQ 2/9  Decreased Interest 1 3 3 2 3   Down, Depressed, Hopeless 1 2 2 2 2   PHQ - 2 Score 2 5 5 4 5   Altered sleeping 2 2 2 2 2   Tired, decreased energy 2 3 3 2 2   Change in appetite 1 3 3 2  0  Feeling bad or failure about yourself  1 3 2 1  0  Trouble concentrating 1 2 3  0 0  Moving slowly or fidgety/restless 0 0 0 0 0  Suicidal thoughts 0 0 0 0 0  PHQ-9 Score 9 18 18 11 9   Difficult doing work/chores Extremely dIfficult  Extremely dIfficult Somewhat difficult Somewhat difficult    phq 9 is positive   Fall Risk:    11/29/2023    1:58 PM 08/30/2023    8:32 AM 06/29/2023    8:40 AM 04/04/2023   10:36 AM 02/20/2023    7:55 AM  Fall Risk   Falls in the past year? 0 1 0 0 0  Number falls in past yr: 0 0 0 0 0  Injury with Fall? 0 0 0 0 0  Risk for fall due to : No Fall Risks No Fall Risks No Fall Risks No Fall Risks No Fall Risks  Follow up Falls prevention discussed;Education provided;Falls evaluation completed Falls prevention discussed Falls prevention discussed;Education provided;Falls evaluation completed Falls prevention discussed;Education provided;Falls evaluation completed Falls prevention discussed    Assessment & Plan Assessment and Plan    Obesity Significant weight loss achieved with Wegovy titration. BMI decreased from morbidly obese to obese range. No reported side effects. -Increase Wegovy to 1.7mg  for one month, then to 2.4mg  for three months. -Check weight and BMI in four months.  MDD and Anxiety Patient has been off medications since April due to insurance issues. Currently experiencing symptoms. -Urged patient to schedule appointment with psychiatrist as soon as possible. -Prescribed Trazodone 50mg  for  sleep.  Sleep Apnea Patient  reports using CPAP machine when sleeping. Currently prescribed Modafinil for somnolence. -Continue Modafinil and CPAP use. -Ensure follow-up with sleep medicine doctor.  Congestive Heart Failure (Diastolic) Patient reports occasional leg swelling and difficulty sleeping flat. Currently taking Lasix as needed. -Continue Lasix as needed. -Monitor heart rate due to current elevation.  Toenail Fungus Completed a three-month course of antifungal therapy in March. -No further treatment needed at this time.  General Health Maintenance -Administer influenza and pneumonia vaccines today. -Repeat thyroid level in one year as per endocrinologist's recommendation. -Schedule follow-up with rheumatologist for joint aches and elevated ANA.

## 2023-11-29 ENCOUNTER — Other Ambulatory Visit: Payer: Self-pay

## 2023-11-29 ENCOUNTER — Ambulatory Visit: Payer: BC Managed Care – PPO | Admitting: Family Medicine

## 2023-11-29 VITALS — BP 118/82 | HR 88 | Temp 98.1°F | Resp 16 | Ht 65.0 in | Wt 205.4 lb

## 2023-11-29 DIAGNOSIS — E66811 Obesity, class 1: Secondary | ICD-10-CM

## 2023-11-29 DIAGNOSIS — R7303 Prediabetes: Secondary | ICD-10-CM | POA: Diagnosis not present

## 2023-11-29 DIAGNOSIS — E042 Nontoxic multinodular goiter: Secondary | ICD-10-CM | POA: Insufficient documentation

## 2023-11-29 DIAGNOSIS — Z23 Encounter for immunization: Secondary | ICD-10-CM

## 2023-11-29 DIAGNOSIS — G4710 Hypersomnia, unspecified: Secondary | ICD-10-CM | POA: Diagnosis not present

## 2023-11-29 DIAGNOSIS — F5104 Psychophysiologic insomnia: Secondary | ICD-10-CM

## 2023-11-29 DIAGNOSIS — G4730 Sleep apnea, unspecified: Secondary | ICD-10-CM

## 2023-11-29 LAB — POCT GLYCOSYLATED HEMOGLOBIN (HGB A1C): Hemoglobin A1C: 5.6 % (ref 4.0–5.6)

## 2023-11-29 MED ORDER — MODAFINIL 200 MG PO TABS
200.0000 mg | ORAL_TABLET | Freq: Every day | ORAL | 0 refills | Status: DC
  Filled 2023-11-29: qty 30, 30d supply, fill #0
  Filled 2024-01-01: qty 30, 30d supply, fill #1
  Filled 2024-02-12: qty 30, 30d supply, fill #2

## 2023-11-29 MED ORDER — WEGOVY 2.4 MG/0.75ML ~~LOC~~ SOAJ
2.4000 mg | SUBCUTANEOUS | 0 refills | Status: DC
  Filled 2023-11-29: qty 9, fill #0
  Filled 2024-01-01: qty 3, 28d supply, fill #0
  Filled 2024-02-06: qty 3, 28d supply, fill #1
  Filled 2024-03-04 – 2024-06-16 (×7): qty 3, 28d supply, fill #2

## 2023-11-29 MED ORDER — TRAZODONE HCL 50 MG PO TABS
50.0000 mg | ORAL_TABLET | Freq: Every evening | ORAL | 0 refills | Status: DC | PRN
  Filled 2023-11-29: qty 30, 10d supply, fill #0

## 2023-11-29 MED ORDER — WEGOVY 1.7 MG/0.75ML ~~LOC~~ SOAJ
1.7000 mg | SUBCUTANEOUS | 0 refills | Status: DC
  Filled 2023-11-29: qty 3, 28d supply, fill #0

## 2023-11-29 NOTE — Patient Instructions (Signed)
[facility] Rheumatology   P: [phone] F: [id number]   Release ID # [id number]

## 2023-11-30 ENCOUNTER — Other Ambulatory Visit: Payer: Self-pay

## 2024-01-01 ENCOUNTER — Encounter: Payer: Self-pay | Admitting: Family Medicine

## 2024-01-01 ENCOUNTER — Other Ambulatory Visit: Payer: Self-pay

## 2024-01-01 ENCOUNTER — Other Ambulatory Visit: Payer: Self-pay | Admitting: Family Medicine

## 2024-01-01 DIAGNOSIS — F5104 Psychophysiologic insomnia: Secondary | ICD-10-CM

## 2024-01-02 ENCOUNTER — Other Ambulatory Visit: Payer: Self-pay

## 2024-01-02 ENCOUNTER — Other Ambulatory Visit: Payer: Self-pay | Admitting: Family Medicine

## 2024-01-02 DIAGNOSIS — F5104 Psychophysiologic insomnia: Secondary | ICD-10-CM

## 2024-01-03 ENCOUNTER — Other Ambulatory Visit: Payer: Self-pay

## 2024-02-12 ENCOUNTER — Other Ambulatory Visit: Payer: Self-pay

## 2024-02-12 ENCOUNTER — Other Ambulatory Visit: Payer: Self-pay | Admitting: Family Medicine

## 2024-02-12 DIAGNOSIS — F5104 Psychophysiologic insomnia: Secondary | ICD-10-CM

## 2024-02-12 DIAGNOSIS — I5032 Chronic diastolic (congestive) heart failure: Secondary | ICD-10-CM

## 2024-02-12 MED ORDER — FUROSEMIDE 20 MG PO TABS
20.0000 mg | ORAL_TABLET | ORAL | 0 refills | Status: DC | PRN
  Filled 2024-02-12: qty 30, 30d supply, fill #0

## 2024-02-13 ENCOUNTER — Other Ambulatory Visit: Payer: Self-pay

## 2024-03-04 ENCOUNTER — Other Ambulatory Visit: Payer: Self-pay

## 2024-03-05 ENCOUNTER — Telehealth: Payer: Self-pay | Admitting: Family Medicine

## 2024-03-05 NOTE — Telephone Encounter (Signed)
 Prior Auth from [facility]   Semaglutide-Weight Management (WEGOVY) 2.4 MG/0.75ML SOAJ   Key: [id number]

## 2024-03-07 ENCOUNTER — Other Ambulatory Visit: Payer: Self-pay

## 2024-03-08 ENCOUNTER — Other Ambulatory Visit (HOSPITAL_COMMUNITY): Payer: Self-pay

## 2024-03-08 ENCOUNTER — Other Ambulatory Visit: Payer: Self-pay

## 2024-03-19 ENCOUNTER — Other Ambulatory Visit: Payer: Self-pay

## 2024-03-20 ENCOUNTER — Other Ambulatory Visit: Payer: Self-pay

## 2024-03-25 ENCOUNTER — Other Ambulatory Visit: Payer: Self-pay

## 2024-03-29 ENCOUNTER — Other Ambulatory Visit: Payer: Self-pay

## 2024-04-15 ENCOUNTER — Other Ambulatory Visit: Payer: Self-pay

## 2024-04-17 ENCOUNTER — Ambulatory Visit: Payer: Self-pay | Admitting: Family Medicine

## 2024-06-03 ENCOUNTER — Other Ambulatory Visit: Payer: Self-pay | Admitting: Family Medicine

## 2024-06-03 ENCOUNTER — Other Ambulatory Visit: Payer: Self-pay

## 2024-06-03 DIAGNOSIS — G4730 Sleep apnea, unspecified: Secondary | ICD-10-CM

## 2024-06-17 ENCOUNTER — Other Ambulatory Visit: Payer: Self-pay

## 2024-06-19 ENCOUNTER — Ambulatory Visit: Payer: Self-pay

## 2024-06-19 NOTE — Telephone Encounter (Signed)
 FYI Only or Action Required?: FYI only for provider.  Patient was last seen in primary care on 11/29/2023 by Glenard Mire, MD. Called Nurse Triage reporting Pain. Symptoms began several weeks ago. Interventions attempted: Prescription medications: Mobic. Symptoms are: gradually worsening.  Triage Disposition: See HCP Within 4 Hours (Or PCP Triage)  Patient/caregiver understands and will follow disposition?: No  Copied from CRM [id number]. Topic: Clinical - Red Word Triage >> Jun 19, 2024  1:06 PM [place] B wrote: Pt's pain is getting worse in breast and back Reason for Disposition  [1] SEVERE pain (e.g., excruciating, unable to do any normal activities) AND [2] not improved 2 hours after pain medicine  Answer Assessment - Initial Assessment Questions 1. ONSET: When did the muscle aches or body pains start?      A few weeks  2. LOCATION: What part of your body is hurting? (e.g., entire body, arms, legs)      Toes, Hands, Upper Right Axillary Area 3. SEVERITY: How bad is the pain? (Scale 1-10; or mild, moderate, severe)   - MILD (1-3): doesn't interfere with normal activities    - MODERATE (4-7): interferes with normal activities or awakens from sleep    - SEVERE (8-10):  excruciating pain, unable to do any normal activities  Moderate to Severe  4. CAUSE: What do you think is causing the pains?     Fibromyalgia  5. FEVER: Have you been having fever?     No  6. OTHER SYMPTOMS: Do you have any other symptoms? (e.g., chest pain, weakness, rash, cold or flu symptoms, weight loss)     No  7. PREGNANCY: Is there any chance you are pregnant? When was your last menstrual period?     No and No  8. TRAVEL: Have you traveled out of the country in the last month? (e.g., travel history, exposures)     No  Medication request: Monafidil and to talk about Wegovy .  Protocols used: Muscle Aches and Body Pain-A-AH

## 2024-06-20 ENCOUNTER — Encounter: Payer: Self-pay | Admitting: Nurse Practitioner

## 2024-06-20 ENCOUNTER — Ambulatory Visit: Admitting: Nurse Practitioner

## 2024-06-20 VITALS — BP 124/80 | Resp 18 | Ht 65.0 in | Wt 210.6 lb

## 2024-06-20 DIAGNOSIS — N644 Mastodynia: Secondary | ICD-10-CM

## 2024-06-20 DIAGNOSIS — R202 Paresthesia of skin: Secondary | ICD-10-CM

## 2024-06-20 DIAGNOSIS — M797 Fibromyalgia: Secondary | ICD-10-CM | POA: Diagnosis not present

## 2024-06-20 DIAGNOSIS — E785 Hyperlipidemia, unspecified: Secondary | ICD-10-CM | POA: Diagnosis not present

## 2024-06-20 DIAGNOSIS — R200 Anesthesia of skin: Secondary | ICD-10-CM

## 2024-06-20 NOTE — Progress Notes (Signed)
 BP 124/80   Resp 18   Ht 5' 5 (1.651 m)   Wt 210 lb 9.6 oz (95.5 kg)   BMI 35.05 kg/m    Subjective:    Patient ID: Teresa Gallagher, female    DOB: February 16, 1961, 63 y.o.   MRN: [id number]  HPI: Teresa Gallagher is a 64 y.o. female  Chief Complaint  Patient presents with   Hair/Scalp Problem    States in middle of scalp, feels bumps with irritation and pain   Breast Problem    States having shoulder, breast and axilla pain on right side   Back Pain    With frequency   Numbness    Tingling in hands    Discussed the use of AI scribe software for clinical note transcription with the patient, who gave verbal consent to proceed.  History of Present Illness Teresa Gallagher is a 63 year old female who presents with scalp irritation, burning sensation in hands and feet, and breast pain.  She has been experiencing scalp irritation for the past three days, characterized by increased tenderness and palpable bumps. Various treatments, including dandruff shampoo, peroxide, rosemary oil, and tea tree oil, have provided some relief. No recent changes in hair products or activities that could have triggered the irritation.  She describes a burning sensation and cramping in her hands and feet, predominantly on the left side, ongoing for over a month. The frequency of these symptoms has increased. She mentions a previous elevation in her A1c levels, although recent blood work indicated some improvement. She has not been monitoring her blood sugar at home recently.  She has been experiencing persistent breast pain for years, particularly in a specific spot on her right breast, severe enough to wake her from sleep. Regular mammograms, with the last one in September, have not identified any concerning findings. The pain has been worsening over time.  She has a history of fibromyalgia and was previously prescribed Lyrica for depression. She reports that she was never on any medication long enough to  determine its effectiveness, as her medications were frequently changed due to side effects such as sleepiness. She has not seen a rheumatologist despite having positive autoimmune markers, as her appointments were canceled. She has seen an endocrinologist for a throat-related issue but not for her current symptoms.         11/29/2023    1:58 PM 08/30/2023    8:36 AM 08/23/2023    8:27 AM  Depression screen PHQ 2/9  Decreased Interest 1 3 3   Down, Depressed, Hopeless 1 2 2   PHQ - 2 Score 2 5 5   Altered sleeping 2 2 2   Tired, decreased energy 2 3 3   Change in appetite 1 3 3   Feeling bad or failure about yourself  1 3 2   Trouble concentrating 1 2 3   Moving slowly or fidgety/restless 0 0 0  Suicidal thoughts 0 0 0  PHQ-9 Score 9 18 18   Difficult doing work/chores Extremely dIfficult  Extremely dIfficult    Relevant past medical, surgical, family and social history reviewed and updated as indicated. Interim medical history since our last visit reviewed. Allergies and medications reviewed and updated.  Review of Systems  Ten systems reviewed and is negative except as mentioned in HPI      Objective:     BP 124/80   Resp 18   Ht 5' 5 (1.651 m)   Wt 210 lb 9.6 oz (95.5 kg)   BMI 35.05  kg/m    Wt Readings from Last 3 Encounters:  06/20/24 210 lb 9.6 oz (95.5 kg)  11/29/23 205 lb 6.4 oz (93.2 kg)  08/30/23 214 lb (97.1 kg)    Physical Exam Physical Exam GENERAL: Alert, cooperative, well developed, no acute distress HEENT: Normocephalic, normal oropharynx, moist mucous membranes CHEST: Clear to auscultation bilaterally, no wheezes, rhonchi, or crackles CARDIOVASCULAR: Normal heart rate and rhythm, S1 and S2 normal without murmurs BREAST: Breast exam normal ABDOMEN: Soft, non-tender, non-distended, without organomegaly, normal bowel sounds EXTREMITIES: No cyanosis or edema NEUROLOGICAL: Cranial nerves grossly intact, moves all extremities without gross motor or sensory  deficit, sensation symmetric and intact   Results for orders placed or performed in visit on 11/29/23  POCT glycosylated hemoglobin (Hb A1C)   Collection Time: 11/29/23  2:12 PM  Result Value Ref Range   Hemoglobin A1C 5.6 4.0 - 5.6 %   HbA1c POC (<> result, manual entry)     HbA1c, POC (prediabetic range)     HbA1c, POC (controlled diabetic range)            Assessment & Plan:   Problem List Items Addressed This Visit   None Visit Diagnoses       Numbness and tingling in both hands    -  Primary   Relevant Orders   Analyzer ANA IFA w/RFLX Titer/Pattern,Systemic Autoimmune Panel 1   CBC with Differential/Platelet   Comprehensive metabolic panel with GFR   Hemoglobin A1c   TSH   VITAMIN D  25 Hydroxy (Vit-D Deficiency, Fractures)   B12 and Folate Panel     Numbness and tingling of both lower extremities       Relevant Orders   Analyzer ANA IFA w/RFLX Titer/Pattern,Systemic Autoimmune Panel 1   CBC with Differential/Platelet   Comprehensive metabolic panel with GFR   Hemoglobin A1c   TSH   VITAMIN D  25 Hydroxy (Vit-D Deficiency, Fractures)   B12 and Folate Panel     Dyslipidemia       Relevant Orders   Lipid panel     Pain of right breast            Assessment and Plan Assessment & Plan Chronic right breast pain Chronic right breast pain localized to a specific spot, severe enough to disrupt sleep. Previous mammogram in September was normal. Pain has been persistent for years and has not changed Possible fibromyalgia-related nerve pain. - Order autoimmune panel to investigate potential underlying causes  Peripheral neuropathy of hands and feet, left greater than right Burning and cramping in hands and feet, more pronounced on the left side. Symptoms have been present for over a month and are increasing in frequency. Previous elevated A1c may be a contributing factor. - Order autoimmune panel to investigate potential underlying causes  Fibromyalgia Diagnosis of  fibromyalgia with associated nerve pain. Previous treatment with Lyrica was not sustained long enough to assess efficacy due to side effects and medication changes. - Order autoimmune panel to investigate potential underlying causes  Positive autoimmune marker, unspecified Positive autoimmune marker noted in past tests. Symptoms may be related to an autoimmune condition. Previous rheumatology appointments were canceled. - Order autoimmune panel for more specific information - Consider referral to rheumatology based on lab results  Elevated hemoglobin A1c Elevated A1c, though recent levels have improved. No recent home blood sugar monitoring. Possible link to peripheral neuropathy symptoms. - Order hemoglobin A1c test to assess current levels  Scalp cysts with tenderness Scalp tenderness with palpable bumps, likely  sebaceous cysts. Symptoms have improved with topical treatments such as tea tree oil and rosemary oil. Condition is benign but causes discomfort. - Continue using tea tree oil and rosemary oil for symptom relief  General Health Maintenance Routine health maintenance and screening tests are due. - Order lipid panel, CBC, CMP, TSH, vitamin D , B12, and folate tests        Follow up plan: Return in about 3 months (around 09/20/2024) for follow up with Dr. Glenard.

## 2024-06-24 ENCOUNTER — Encounter: Payer: Self-pay | Admitting: Family Medicine

## 2024-06-24 ENCOUNTER — Ambulatory Visit: Admitting: Family Medicine

## 2024-06-24 ENCOUNTER — Ambulatory Visit: Payer: Self-pay | Admitting: Nurse Practitioner

## 2024-06-24 ENCOUNTER — Other Ambulatory Visit (HOSPITAL_COMMUNITY): Payer: Self-pay

## 2024-06-24 VITALS — BP 118/76 | HR 89 | Resp 16 | Ht 65.0 in | Wt 210.5 lb

## 2024-06-24 DIAGNOSIS — R768 Other specified abnormal immunological findings in serum: Secondary | ICD-10-CM

## 2024-06-24 DIAGNOSIS — E7800 Pure hypercholesterolemia, unspecified: Secondary | ICD-10-CM

## 2024-06-24 DIAGNOSIS — G4730 Sleep apnea, unspecified: Secondary | ICD-10-CM

## 2024-06-24 DIAGNOSIS — M797 Fibromyalgia: Secondary | ICD-10-CM | POA: Diagnosis not present

## 2024-06-24 DIAGNOSIS — I5032 Chronic diastolic (congestive) heart failure: Secondary | ICD-10-CM

## 2024-06-24 DIAGNOSIS — F331 Major depressive disorder, recurrent, moderate: Secondary | ICD-10-CM

## 2024-06-24 DIAGNOSIS — F5104 Psychophysiologic insomnia: Secondary | ICD-10-CM

## 2024-06-24 DIAGNOSIS — E559 Vitamin D deficiency, unspecified: Secondary | ICD-10-CM

## 2024-06-24 DIAGNOSIS — R202 Paresthesia of skin: Secondary | ICD-10-CM

## 2024-06-24 DIAGNOSIS — G4710 Hypersomnia, unspecified: Secondary | ICD-10-CM

## 2024-06-24 DIAGNOSIS — R200 Anesthesia of skin: Secondary | ICD-10-CM

## 2024-06-24 DIAGNOSIS — R7303 Prediabetes: Secondary | ICD-10-CM

## 2024-06-24 MED ORDER — FUROSEMIDE 20 MG PO TABS: 20.0000 mg | ORAL_TABLET | Freq: Every day | ORAL | 0 refills | Status: DC | PRN

## 2024-06-24 MED ORDER — HYDROXYZINE PAMOATE 25 MG PO CAPS: 25.0000 mg | ORAL_CAPSULE | Freq: Two times a day (BID) | ORAL | 0 refills | Status: AC | PRN

## 2024-06-24 MED ORDER — WEGOVY 0.25 MG/0.5ML ~~LOC~~ SOAJ: 0.2500 mg | SUBCUTANEOUS | 0 refills | Status: DC

## 2024-06-24 MED ORDER — MODAFINIL 200 MG PO TABS
200.0000 mg | ORAL_TABLET | Freq: Every day | ORAL | 0 refills | Status: DC
Start: 2024-06-24 — End: 2024-09-24

## 2024-06-24 MED ORDER — PREGABALIN 50 MG PO CAPS: 50.0000 mg | ORAL_CAPSULE | Freq: Two times a day (BID) | ORAL | 0 refills | Status: DC

## 2024-06-24 MED ORDER — AUVELITY 45-105 MG PO TBCR: 1.0000 | EXTENDED_RELEASE_TABLET | ORAL | 1 refills | Status: DC

## 2024-06-24 NOTE — Progress Notes (Signed)
 Name: Teresa Gallagher   MRN: [id number]    DOB: 02-Apr-1961   Date:06/24/2024       Progress Note  Subjective  Chief Complaint  Chief Complaint  Patient presents with   Medical Management of Chronic Issues   Discussed the use of AI scribe software for clinical note transcription with the patient, who gave verbal consent to proceed.  History of Present Illness Teresa Gallagher is a 63 year old female with fibromyalgia and chronic diastolic heart failure who presents for a follow-up regarding tingling and numbness, medication management, and weight loss.  She experiences tingling and numbness in her hands and feet, which have been occurring intermittently for over a year but have become more frequent recently. The symptoms are worse on the left side and can involve the entire hand or arm, not just specific fingers. She has not seen a neurologist in many years and has not had recent nerve conduction studies. Previous blood work showed normal B12 and folic acid levels.  She is here for medication management, specifically for Modafinil  and Wegovy . She has not received Wegovy  for two months due to insurance requirements for regular follow-up visits. She was previously losing weight with Wegovy  and is interested in resuming it.   She has a history of fibromyalgia, which she believes is causing increased pain and discomfort. She has previously taken pregabalin  but is not currently on it. She experiences chronic pain, particularly in her right breast, and is interested in trying Lyrica  or Neurontin for fibromyalgia management.  She has chronic diastolic heart failure and takes Lasix  two to three times a week. She experiences shortness of breath with activity, swelling in her legs, and orthopnea, requiring her to sleep elevated. She has not been taking meloxicam for her osteoarthritis and is experiencing joint pain, particularly in her knees, which she attributes to a past meniscus issue.  She has  obesity, which began around 2014-2015 following a hysterectomy. Her heaviest weight was around 220 pounds. She has tried the keto diet and portion control, and is currently walking and avoiding sweet drinks and fried foods. She has a BMI over 35 with co morbidities such as OSA and pre diabetes  She has sleep apnea and uses modafinil  to manage daytime somnolence, but has run out of the medication. She is not currently seeing a doctor for her sleep apnea as her previous doctor is retiring.  She has recurrent major depression and has not been seeing her previous psychiatrist, Teresa Gallagher , since June. She experiences ongoing symptoms of depression, including lack of motivation and concentration, which have affected her ability to work. She was previously on Auvelity   and hydroxyzine  for anxiety, which she found helpful. She has not taken Ativan in over a year, she used to take a mood stabilizer but has also been out of medication for many months.    Patient Active Problem List   Diagnosis Date Noted   Non-toxic multinodular goiter 11/29/2023   Adenomatous polyp of descending colon 11/08/2022   Rectal polyp 11/08/2022   Chronic diastolic heart failure (HCC) 06/10/2022   Breast lump on right side at 10 o'clock position 06/10/2022   Circadian rhythm sleep disorder, shift work type 03/23/2021   Attention and concentration deficit 01/20/2020   Alcohol use disorder, mild, in early remission 10/29/2019   Old tear of meniscus of right knee 03/20/2019   SOB (shortness of breath) 08/29/2017   Episodic tension-type headache, not intractable 12/20/2016   History of blood transfusion 07/30/2015  GAD (generalized anxiety disorder) 07/30/2015   Chronic constipation 07/13/2015   Insomnia 07/13/2015   MDD (major depressive disorder), recurrent episode, mild (HCC) 07/13/2015   Fibromyalgia syndrome 07/13/2015   Herpes simplex type 2 infection 07/13/2015   Morbid obesity (HCC) 07/13/2015   Sleep apnea with  hypersomnolence 07/13/2015   Vitamin D  deficiency 07/13/2015    Past Surgical History:  Procedure Laterality Date   ABDOMINAL HYSTERECTOMY     BUNIONECTOMY Right 11/07/2014   Dr. Primus at [facility]    CARDIAC CATHETERIZATION     CARPAL TUNNEL RELEASE Left 12/19/2010   CESAREAN SECTION     COLONOSCOPY WITH PROPOFOL  N/A 11/08/2022   Procedure: COLONOSCOPY WITH PROPOFOL ;  Surgeon: Unk Teresa Skiff, MD;  Location: [facility];  Service: Gastroenterology;  Laterality: N/A;   LAPAROSCOPIC HYSTERECTOMY     followed by 3 cuff repairs   LIPOMA EXCISION  12/19/2010   REFRACTIVE SURGERY Bilateral    RIGHT/LEFT HEART CATH AND CORONARY ANGIOGRAPHY N/A 09/02/2021   Procedure: RIGHT/LEFT HEART CATH AND CORONARY ANGIOGRAPHY;  Surgeon: Teresa Toribio SAUNDERS, MD;  Location: [facility];  Service: Cardiovascular;  Laterality: N/A;   TONSILLECTOMY      Family History  Problem Relation Age of Onset   Heart attack Father    Alcohol abuse Father    COPD Sister        end stages   Bipolar disorder Brother    Depression Brother    Bipolar disorder Brother    Depression Brother    Alcohol abuse Brother    Heart attack Paternal Uncle    Breast cancer Paternal Aunt    Breast cancer Maternal Aunt     Social History   Tobacco Use   Smoking status: Never   Smokeless tobacco: Never  Substance Use Topics   Alcohol use: Not Currently     Current Outpatient Medications:    Glucos-Chond-Hyal Ac-Ca Fructo (MOVE FREE JOINT HEALTH ADVANCE PO), Take 1 tablet by mouth daily., Disp: , Rfl:    Glycerin-Hypromellose-PEG 400 (ARTIFICIAL TEARS) 0.2-0.2-1 % SOLN, Place 1 drop into both eyes daily as needed (dry eyes)., Disp: , Rfl:    Multiple Vitamin (MULTIVITAMIN) tablet, Take 1 tablet by mouth daily., Disp: , Rfl:    potassium chloride  SA (KLOR-CON  M20) 20 MEQ tablet, TAKE 1 TABLET (20 MEQ TOTAL) BY MOUTH DAILY AS NEEDED. WHEN YOU TAKE LASIX , Disp: 30 tablet, Rfl: 0   Semaglutide -Weight  Management (WEGOVY ) 0.25 MG/0.5ML SOAJ, Inject 0.25 mg into the skin once a week., Disp: 2 mL, Rfl: 0   valACYclovir  (VALTREX ) 500 MG tablet, TAKE 1 TABLET (500 MG TOTAL) BY MOUTH DAILY. AND TWICE DAILY FOR OUTBREAKS, Disp: 180 tablet, Rfl: 1   zinc gluconate 50 MG tablet, Take 50 mg by mouth daily., Disp: , Rfl:    AUVELITY  45-105 MG TBCR, Take 1 tablet by mouth every morning., Disp: 90 tablet, Rfl: 1   baclofen  (LIORESAL ) 10 MG tablet, TAKE 1 TABLET BY MOUTH THREE TIMES A DAY AS NEEDED FOR MUSCLE SPASMS (Patient not taking: Reported on 06/24/2024), Disp: 30 tablet, Rfl: 0   furosemide  (LASIX ) 20 MG tablet, Take 1 tablet (20 mg total) by mouth daily as needed for fluid or edema., Disp: 90 tablet, Rfl: 0   hydrOXYzine  (VISTARIL ) 25 MG capsule, Take 1 capsule (25 mg total) by mouth 2 (two) times daily as needed., Disp: 180 capsule, Rfl: 0   meloxicam (MOBIC) 15 MG tablet, Take 15 mg by mouth daily. (Patient not  taking: Reported on 06/24/2024), Disp: , Rfl:    modafinil  (PROVIGIL ) 200 MG tablet, Take 1 tablet (200 mg total) by mouth daily., Disp: 90 tablet, Rfl: 0   pregabalin  (LYRICA ) 50 MG capsule, Take 1 capsule (50 mg total) by mouth 2 (two) times daily., Disp: 180 capsule, Rfl: 0  No Known Allergies  I personally reviewed active problem list, medication list, allergies with the patient/caregiver today.   ROS  Ten systems reviewed and is negative except as mentioned in HPI    Objective Physical Exam CONSTITUTIONAL: Patient appears well-developed and well-nourished. No distress. HEENT: Head atraumatic, normocephalic, neck supple. CARDIOVASCULAR: Normal rate, regular rhythm and normal heart sounds. No murmur heard. Trace edema in legs. PULMONARY: Effort normal and breath sounds normal. Lungs clear to auscultation bilaterally. No respiratory distress. ABDOMINAL: There is no tenderness or distention. MUSCULOSKELETAL: Normal gait. Without gross motor or sensory deficit. Trigger point  positive PSYCHIATRIC: Patient has a normal mood and affect. Behavior is normal. Judgment and thought content normal.  Vitals:   06/24/24 0948  BP: 118/76  Pulse: 89  Resp: 16  SpO2: 94%  Weight: 210 lb 8 oz (95.5 kg)  Height: 5' 5 (1.651 m)    Body mass index is 35.03 kg/m.  Recent Results (from the past 2160 hours)  CBC with Differential/Platelet     Status: None   Collection Time: 06/20/24  2:15 PM  Result Value Ref Range   WBC 7.3 3.8 - 10.8 Thousand/uL   RBC 4.67 3.80 - 5.10 Million/uL   Hemoglobin 13.8 11.7 - 15.5 g/dL   HCT 58.1 64.9 - 54.9 %   MCV 89.5 80.0 - 100.0 fL   MCH 29.6 27.0 - 33.0 pg   MCHC 33.0 32.0 - 36.0 g/dL    Comment: For adults, a slight decrease in the calculated MCHC value (in the range of 30 to 32 g/dL) is most likely not clinically significant; however, it should be interpreted with caution in correlation with other red cell parameters and the patient's clinical condition.    RDW 14.2 11.0 - 15.0 %   Platelets 213 140 - 400 Thousand/uL   MPV 12.0 7.5 - 12.5 fL   Neutro Abs 3,957 1,500 - 7,800 cells/uL   Absolute Lymphocytes 2,650 850 - 3,900 cells/uL   Absolute Monocytes 540 200 - 950 cells/uL   Eosinophils Absolute 117 15 - 500 cells/uL   Basophils Absolute 37 0 - 200 cells/uL   Neutrophils Relative % 54.2 %   Total Lymphocyte 36.3 %   Monocytes Relative 7.4 %   Eosinophils Relative 1.6 %   Basophils Relative 0.5 %  Comprehensive metabolic panel with GFR     Status: None   Collection Time: 06/20/24  2:15 PM  Result Value Ref Range   Glucose, Bld 76 65 - 99 mg/dL    Comment: .            Fasting reference interval .    BUN 15 7 - 25 mg/dL   Creat 9.08 9.49 - 8.94 mg/dL   eGFR 71 > OR = 60 fO/fpw/8.26f7   BUN/Creatinine Ratio SEE NOTE: 6 - 22 (calc)    Comment:    Not Reported: BUN and Creatinine are within    reference range. .    Sodium 138 135 - 146 mmol/L   Potassium 4.5 3.5 - 5.3 mmol/L   Chloride 102 98 - 110 mmol/L    CO2 30 20 - 32 mmol/L   Calcium 9.4 8.6 - 10.4  mg/dL   Total Protein 6.8 6.1 - 8.1 g/dL   Albumin 4.3 3.6 - 5.1 g/dL   Globulin 2.5 1.9 - 3.7 g/dL (calc)   AG Ratio 1.7 1.0 - 2.5 (calc)   Total Bilirubin 0.6 0.2 - 1.2 mg/dL   Alkaline phosphatase (APISO) 73 37 - 153 U/L   AST 19 10 - 35 U/L   ALT 14 6 - 29 U/L  Hemoglobin A1c     Status: Abnormal   Collection Time: 06/20/24  2:15 PM  Result Value Ref Range   Hgb A1c MFr Bld 5.7 (H) <5.7 %    Comment: For someone without known diabetes, a hemoglobin  A1c value between 5.7% and 6.4% is consistent with prediabetes and should be confirmed with a  follow-up test. . For someone with known diabetes, a value <7% indicates that their diabetes is well controlled. A1c targets should be individualized based on duration of diabetes, age, comorbid conditions, and other considerations. . This assay result is consistent with an increased risk of diabetes. . Currently, no consensus exists regarding use of hemoglobin A1c for diagnosis of diabetes for children. .    Mean Plasma Glucose 117 mg/dL   eAG (mmol/L) 6.5 mmol/L  TSH     Status: None   Collection Time: 06/20/24  2:15 PM  Result Value Ref Range   TSH 1.32 0.40 - 4.50 mIU/L  VITAMIN D  25 Hydroxy (Vit-D Deficiency, Fractures)     Status: Abnormal   Collection Time: 06/20/24  2:15 PM  Result Value Ref Range   Vit D, 25-Hydroxy 24 (L) 30 - 100 ng/mL    Comment: Vitamin D  Status         25-OH Vitamin D : . Deficiency:                    <20 ng/mL Insufficiency:             20 - 29 ng/mL Optimal:                 > or = 30 ng/mL . For 25-OH Vitamin D  testing on patients on  D2-supplementation and patients for whom quantitation  of D2 and D3 fractions is required, the QuestAssureD(TM) 25-OH VIT D, (D2,D3), LC/MS/MS is recommended: order  code 07111 (patients >28yrs). . See Note 1 . Note 1 . For additional information, please refer to   http://education.[facility].com/faq/FAQ199  (This link is being provided for informational/ educational purposes only.)   B12 and Folate Panel     Status: Abnormal   Collection Time: 06/20/24  2:15 PM  Result Value Ref Range   Vitamin B-12 1,478 (H) 200 - 1,100 pg/mL   Folate >24.0 ng/mL    Comment:                            Reference Range                            Low:           <3.4                            Borderline:    3.4-5.4                            Normal:        >  5.4 .   Lipid panel     Status: Abnormal   Collection Time: 06/20/24  2:15 PM  Result Value Ref Range   Cholesterol 217 (H) <200 mg/dL   HDL 72 > OR = 50 mg/dL   Triglycerides 897 <849 mg/dL   LDL Cholesterol (Calc) 125 (H) mg/dL (calc)    Comment: Reference range: <100 . Desirable range <100 mg/dL for primary prevention;   <70 mg/dL for patients with CHD or diabetic patients  with > or = 2 CHD risk factors. SABRA LDL-C is now calculated using the Martin-Hopkins  calculation, which is a validated novel method providing  better accuracy than the Friedewald equation in the  estimation of LDL-C.  Gladis APPLETHWAITE et al. SANDREA. 7986;689(80): 2061-2068  (http://education.[facility].com/faq/FAQ164)    Total CHOL/HDL Ratio 3.0 <5.0 (calc)   Non-HDL Cholesterol (Calc) 145 (H) <130 mg/dL (calc)    Comment: For patients with diabetes plus 1 major ASCVD risk  factor, treating to a non-HDL-C goal of <100 mg/dL  (LDL-C of <29 mg/dL) is considered a therapeutic  option.     Diabetic Foot Exam:     PHQ2/9:    06/24/2024    9:46 AM 11/29/2023    1:58 PM 08/30/2023    8:36 AM 08/23/2023    8:27 AM 06/29/2023    8:40 AM  Depression screen PHQ 2/9  Decreased Interest 2 1 3 3 2   Down, Depressed, Hopeless 2 1 2 2 2   PHQ - 2 Score 4 2 5 5 4   Altered sleeping 2 2 2 2 2   Tired, decreased energy 2 2 3 3 2   Change in appetite 2 1 3 3 2   Feeling bad or failure about yourself  2 1 3 2 1   Trouble concentrating  2 1 2 3  0  Moving slowly or fidgety/restless 0 0 0 0 0  Suicidal thoughts 0 0 0 0 0  PHQ-9 Score 14 9 18 18 11   Difficult doing work/chores Very difficult Extremely dIfficult  Extremely dIfficult Somewhat difficult    phq 9 is positive  Fall Risk:    06/24/2024    9:46 AM 11/29/2023    1:58 PM 08/30/2023    8:32 AM 06/29/2023    8:40 AM 04/04/2023   10:36 AM  Fall Risk   Falls in the past year? 0 0 1 0 0  Number falls in past yr: 0 0 0 0 0  Injury with Fall? 0 0 0 0 0  Risk for fall due to : No Fall Risks No Fall Risks No Fall Risks No Fall Risks No Fall Risks  Follow up Falls evaluation completed Falls prevention discussed;Education provided;Falls evaluation completed Falls prevention discussed Falls prevention discussed;Education provided;Falls evaluation completed Falls prevention discussed;Education provided;Falls evaluation completed    Assessment & Plan Tingling and numbness of hands and feet Intermittent tingling and numbness, more frequent recently. B12 and folic acid levels normal. Differential includes pinched nerve, fibromyalgia, or other neurological issues. Symptoms worse on left side. Previous carpal tunnel surgery, but current symptoms not consistent with carpal tunnel syndrome. Fibromyalgia may contribute to symptoms. - Refer to neurologist for further evaluation.   Fibromyalgia with chronic pain Chronic pain with fibromyalgia, aching in various spots. Previous treatment with pregabalin  was not sustained long enough to assess efficacy. Fibromyalgia may cause tingling and numbness. - Prescribe pregabalin  50 mg twice a day. - Start with 50 mg at night for the first week, then increase to twice a day. - Monitor response to  pregabalin .  Chronic diastolic heart failure Chronic diastolic heart failure with symptoms of shortness of breath on exertion, leg swelling, and orthopnea. Lasix  previously prescribed and taken 2-3 times a week as needed. - Continue Lasix  as needed for  fluid management.  Obstructive sleep apnea with daytime somnolence Obstructive sleep apnea with daytime somnolence. Previously managed with modafinil . Uses CPAP but needs a new sleep specialist due to current provider's retirement. - Prescribe modafinil  200 mg for daytime somnolence. - Find a new sleep specialist for ongoing management.  Major depressive disorder Moderate recurrent major depressive disorder with symptoms of low motivation and lack of concentration. Previous treatment with Auvelity  and Caplyta was beneficial. PHQ-9 score is 14, indicating moderate depression. - resume Auvelity   - Refer to psychiatrist for ongoing management. Used to take Caplyta   Obesity / Morbid  Obesity with BMI over 35, associated with sleep apnea, prediabetes, and osteoarthritis. Previous successful weight loss with Wegovy . Current weight 210.5 lbs, BMI over 35. She has tried keto diet and portion control. Documentation of weight loss efforts is necessary for insurance approval. - Prescribe Wegovy  for weight management. - Refer to weight loss program at [facility] for comprehensive management. - Document weight loss efforts monthly for insurance approval.  Hypercholesterolemia Hypercholesterolemia with LDL at 125 mg/dL, HDL at 72 mg/dL, and triglycerides at 897 mg/dL. She is 63 years old and advised to maintain a healthy diet and physical activity. - Encourage healthy diet and physical activity.  Vitamin D  deficiency Low vitamin D  levels noted. Not currently taking supplements. - Recommend over-the-counter vitamin D  supplements, 2000 IU daily.  Osteoarthritis of knee Osteoarthritis of the knee with ongoing pain, previously related to meniscus issues. No current use of meloxicam. - Consider referral to orthopedic specialist if symptoms worsen.  Arthralgia and myalgia Joint and muscle aches, possibly related to fibromyalgia or other rheumatologic conditions. Previous abnormal ANA noted. - waiting to see  rheumatologist for further evaluation.

## 2024-06-25 ENCOUNTER — Other Ambulatory Visit (HOSPITAL_COMMUNITY): Payer: Self-pay

## 2024-06-26 ENCOUNTER — Encounter (INDEPENDENT_AMBULATORY_CARE_PROVIDER_SITE_OTHER): Payer: Self-pay

## 2024-06-26 ENCOUNTER — Other Ambulatory Visit (HOSPITAL_COMMUNITY): Payer: Self-pay

## 2024-06-26 ENCOUNTER — Telehealth: Payer: Self-pay | Admitting: Pharmacy Technician

## 2024-06-26 NOTE — Telephone Encounter (Signed)
 Pharmacy Patient Advocate Encounter   Received notification from CoverMyMeds that prior authorization for Wegovy  0.25 mg is required/requested.   Insurance verification completed.   The patient is insured through [employer] .   Per test claim: PA required; PA submitted to above mentioned insurance via CoverMyMeds Key/confirmation #/EOC [id number] Status is pending

## 2024-06-26 NOTE — Telephone Encounter (Signed)
 Pharmacy Patient Advocate Encounter  Received notification from [facility] that Prior Authorization for Wegovy  0.25MG /0.5ML auto-injectors has been DENIED.  Full denial letter will be uploaded to the media tab. See denial reason below.     PA #/Case ID/Reference #: [id number]

## 2024-06-27 LAB — LIPID PANEL
Cholesterol: 217 mg/dL — ABNORMAL HIGH (ref <200)
HDL: 72 mg/dL (ref >=50)
LDL Cholesterol (Calc): 125 mg/dL — ABNORMAL HIGH
Non-HDL Cholesterol (Calc): 145 mg/dL — ABNORMAL HIGH (ref <130)
Total CHOL/HDL Ratio: 3 (calc) (ref <5.0)
Triglycerides: 102 mg/dL (ref <150)

## 2024-06-27 LAB — COMPREHENSIVE METABOLIC PANEL WITH GFR
AG Ratio: 1.7 (calc) (ref 1.0–2.5)
ALT: 14 U/L (ref 6–29)
AST: 19 U/L (ref 10–35)
Albumin: 4.3 g/dL (ref 3.6–5.1)
Alkaline phosphatase (APISO): 73 U/L (ref 37–153)
BUN: 15 mg/dL (ref 7–25)
CO2: 30 mmol/L (ref 20–32)
Calcium: 9.4 mg/dL (ref 8.6–10.4)
Chloride: 102 mmol/L (ref 98–110)
Creat: 0.91 mg/dL (ref 0.50–1.05)
Globulin: 2.5 g/dL (ref 1.9–3.7)
Glucose, Bld: 76 mg/dL (ref 65–99)
Potassium: 4.5 mmol/L (ref 3.5–5.3)
Sodium: 138 mmol/L (ref 135–146)
Total Bilirubin: 0.6 mg/dL (ref 0.2–1.2)
Total Protein: 6.8 g/dL (ref 6.1–8.1)
eGFR: 71 mL/min/1.73m2 (ref >=60)

## 2024-06-27 LAB — ANALYZER(R)ANA IFA WITH REFLEX TITER/PATTRN,SYS AUTOIMM PNL1
Anti Nuclear Antibody (ANA): POSITIVE — AB
Anticardiolipin IgA: <2 [APL'U]/mL
Anticardiolipin IgG: <2 [GPL'U]/mL
Anticardiolipin IgM: 2.6 [MPL'U]/mL
Beta-2 Glyco 1 IgA: <2 U/mL
Beta-2 Glyco 1 IgM: 2 U/mL
Beta-2 Glyco I IgG: <2 U/mL
C3 Complement: 135 mg/dL (ref 83–193)
C4 Complement: 34 mg/dL (ref 15–57)
Centromere Ab Screen: <1 AI
Chromatin (Nucleosomal) Antibody: <1 AI
Cyclic Citrullin Peptide Ab: <16 U
DNA Ab (DS) Crithidia, IFA: NEGATIVE
ENA SM Ab Ser-aCnc: <1 AI
Jo-1 Autoabs: <1 AI
MUTATED CITRULLINATED VIMENTIN (MCV) AB: <20 U/mL (ref <20)
Rheumatoid Factor (IgA): <5 U
Rheumatoid Factor (IgG): <5 U
Rheumatoid Factor (IgM): <5 U
Ribonucleic Protein(ENA) Antibody, IgG: <1 AI
SM/RNP: <1 AI
SSA (Ro) (ENA) Antibody, IgG: <1 AI
SSB (La) (ENA) Antibody, IgG: <1 AI
Scleroderma (Scl-70) (ENA) Antibody, IgG: <1 AI
Thyroperoxidase Ab SerPl-aCnc: 1 [IU]/mL (ref <9)

## 2024-06-27 LAB — CBC WITH DIFFERENTIAL/PLATELET
Absolute Lymphocytes: 2650 {cells}/uL (ref 850–3900)
Absolute Monocytes: 540 {cells}/uL (ref 200–950)
Basophils Absolute: 37 {cells}/uL (ref 0–200)
Basophils Relative: 0.5 %
Eosinophils Absolute: 117 {cells}/uL (ref 15–500)
Eosinophils Relative: 1.6 %
HCT: 41.8 % (ref 35.0–45.0)
Hemoglobin: 13.8 g/dL (ref 11.7–15.5)
MCH: 29.6 pg (ref 27.0–33.0)
MCHC: 33 g/dL (ref 32.0–36.0)
MCV: 89.5 fL (ref 80.0–100.0)
MPV: 12 fL (ref 7.5–12.5)
Monocytes Relative: 7.4 %
Neutro Abs: 3957 {cells}/uL (ref 1500–7800)
Neutrophils Relative %: 54.2 %
Platelets: 213 Thousand/uL (ref 140–400)
RBC: 4.67 Million/uL (ref 3.80–5.10)
RDW: 14.2 % (ref 11.0–15.0)
Total Lymphocyte: 36.3 %
WBC: 7.3 Thousand/uL (ref 3.8–10.8)

## 2024-06-27 LAB — B12 AND FOLATE PANEL
Folate: >24 ng/mL
Vitamin B-12: 1478 pg/mL — ABNORMAL HIGH (ref 200–1100)

## 2024-06-27 LAB — TSH: TSH: 1.32 m[IU]/L (ref 0.40–4.50)

## 2024-06-27 LAB — HEMOGLOBIN A1C
Hgb A1c MFr Bld: 5.7 % — ABNORMAL HIGH (ref <5.7)
Mean Plasma Glucose: 117 mg/dL
eAG (mmol/L): 6.5 mmol/L

## 2024-06-27 LAB — ANTI-NUCLEAR AB-TITER (ANA TITER): ANA Titer 1: 1:160 {titer} — ABNORMAL HIGH

## 2024-06-27 LAB — VITAMIN D 25 HYDROXY (VIT D DEFICIENCY, FRACTURES): Vit D, 25-Hydroxy: 24 ng/mL — ABNORMAL LOW (ref 30–100)

## 2024-07-15 ENCOUNTER — Other Ambulatory Visit (HOSPITAL_COMMUNITY): Payer: Self-pay

## 2024-07-17 ENCOUNTER — Ambulatory Visit (INDEPENDENT_AMBULATORY_CARE_PROVIDER_SITE_OTHER): Admitting: Physician Assistant

## 2024-07-17 ENCOUNTER — Encounter (INDEPENDENT_AMBULATORY_CARE_PROVIDER_SITE_OTHER): Payer: Self-pay | Admitting: Physician Assistant

## 2024-07-17 VITALS — BP 122/83 | HR 85 | Temp 98.2°F | Ht 63.0 in | Wt 210.0 lb

## 2024-07-17 DIAGNOSIS — M797 Fibromyalgia: Secondary | ICD-10-CM

## 2024-07-17 DIAGNOSIS — R41840 Attention and concentration deficit: Secondary | ICD-10-CM

## 2024-07-17 DIAGNOSIS — E042 Nontoxic multinodular goiter: Secondary | ICD-10-CM

## 2024-07-17 DIAGNOSIS — E785 Hyperlipidemia, unspecified: Secondary | ICD-10-CM

## 2024-07-17 DIAGNOSIS — G4733 Obstructive sleep apnea (adult) (pediatric): Secondary | ICD-10-CM | POA: Diagnosis not present

## 2024-07-17 DIAGNOSIS — F411 Generalized anxiety disorder: Secondary | ICD-10-CM

## 2024-07-17 DIAGNOSIS — G44219 Episodic tension-type headache, not intractable: Secondary | ICD-10-CM

## 2024-07-17 DIAGNOSIS — D124 Benign neoplasm of descending colon: Secondary | ICD-10-CM

## 2024-07-17 DIAGNOSIS — Z0289 Encounter for other administrative examinations: Secondary | ICD-10-CM

## 2024-07-17 DIAGNOSIS — R7303 Prediabetes: Secondary | ICD-10-CM

## 2024-07-17 DIAGNOSIS — F339 Major depressive disorder, recurrent, unspecified: Secondary | ICD-10-CM | POA: Diagnosis not present

## 2024-07-17 DIAGNOSIS — F5101 Primary insomnia: Secondary | ICD-10-CM

## 2024-07-17 DIAGNOSIS — K5909 Other constipation: Secondary | ICD-10-CM

## 2024-07-17 DIAGNOSIS — M2550 Pain in unspecified joint: Secondary | ICD-10-CM

## 2024-07-17 DIAGNOSIS — E559 Vitamin D deficiency, unspecified: Secondary | ICD-10-CM

## 2024-07-17 DIAGNOSIS — I5032 Chronic diastolic (congestive) heart failure: Secondary | ICD-10-CM

## 2024-07-17 DIAGNOSIS — F330 Major depressive disorder, recurrent, mild: Secondary | ICD-10-CM

## 2024-07-17 DIAGNOSIS — Z6837 Body mass index (BMI) 37.0-37.9, adult: Secondary | ICD-10-CM

## 2024-07-17 DIAGNOSIS — E669 Obesity, unspecified: Secondary | ICD-10-CM

## 2024-07-17 NOTE — Progress Notes (Signed)
 Office: [phone]  /  Fax: [phone]   Initial Visit    Teresa Gallagher was seen in clinic today to evaluate for obesity. She is interested in losing weight to improve overall health and reduce the risk of weight related complications. She presents today to review program treatment options, initial physical assessment, and evaluation.     She was referred by: PCP  When asked what else they would like to accomplish? She states: Adopt a healthier eating pattern and lifestyle, Improve energy levels and physical activity, Improve existing medical conditions, Reduce number of medications, Improve quality of life, Improve appearance, Improve self-confidence, and Lose 25-30 l lbs  When asked how has your weight affected you? She states: Has affected self-esteem, Relationships, Contributed to medical problems, Contributed to orthopedic problems or mobility issues, Having fatigue, Having poor endurance, Problems with eating patterns, and Has affected mood   Weight history: Teresa Gallagher is a 63 year old female who presents for obesity management.  She has experienced weight gain since her pregnancies, with a significant increase during her second pregnancy. Her weight reached 185 pounds in 2011 and has been challenging to manage since, particularly after a hysterectomy in 2015, which led to her weight exceeding 200 pounds. Her highest non-pregnant weight was 219 pounds. She has tried various weight loss methods, including low-carb diets and medications like Wegovy , which she used for four to five months with some success before insurance issues interrupted her treatment.  She has sleep apnea and has been diagnosed with prediabetes. She has a history of mild congestive heart failure and uses an inhaler as needed for shortness of breath, though she has not been formally diagnosed with asthma. She has had two positive ANA tests. She is scheduled to see a rheumatologist next month due to a family  history of rheumatoid arthritis.  Her weight significantly impacts her self-esteem, relationships, and mood, contributing to feelings of depression. Her weight affects her ability to exercise due to joint discomfort and endurance issues. She experiences abnormal hunger signals, sometimes feeling full after a few bites or being able to eat for hours without feeling full.  Her lifestyle includes a sedentary lifestyle and chronic meal skipping, which she believes contributes to her weight issues. She has a history of multiple weight loss attempts and describes herself as an 'all or none' person, often struggling with balance in her eating habits. She is concerned about the potential for visceral fat and its associated health risks, including type 2 diabetes and cardiovascular issues.  Highest weight: 219 lbs  Some associated conditions: Arthritis:?RA/inflammatory arthritis, Hyperlipidemia, OSA, Prediabetes, GERD, Heart Failure, Lung disease, Vitamin D  Deficiency, and Connective tissue disease: fibromylagia  Contributing factors: family history of obesity, disruption of circadian rhythm / sleep disordered breathing, consumption of processed foods, moderate to high levels of stress, reduced physical activity, chronic skipping of meals, mental health problems, menopause, strong orexigenic signaling and/or inadequate inhibitory control , slow metabolism for age, enticing relationships and enviroment, multiple weight loss attempts in the past, hectic pace of life, self - critic or all-or-none mindset, and sedentary lifestyle  Weight promoting medications identified: None  Prior weight loss attempts: Low Carb, Ketogenic, Tracking and Journaling, Balanced Plate / Portion Control, and Intermittent fasting  Current nutrition plan: Portion control / smart choices and Prepackaged nutrition  Current level of physical activity: None and Limited due to chronic pain or orthopedic problems  Current or previous  pharmacotherapy: GLP-1 Wegovy  for several months  Response to medication:  Was cost prohibitive or lost coverage for AOM and lost weight but had problems with insurance   Past medical history includes:   Past Medical History:  Diagnosis Date   Acne    Arrhythmia    Baker's cyst, right 10/2018   calf   Blood transfusion without reported diagnosis    Bunion    Cervicalgia    Chronic kidney disease    Kidney Stents   Depression    Herpes simplex without mention of complication    Mastodynia    Myalgia and myositis, unspecified    Other acne    Other malaise and fatigue    Painful respiration    Palpitations    Seborrhea capitis    Sleep apnea in adult    Symptomatic menopausal or female climacteric states    Unspecified constipation    Unspecified sleep apnea    Unspecified vitamin D  deficiency    Vitamin D  deficiency      Objective    BP 122/83   Pulse 85   Temp 98.2 F (36.8 C)   Ht 5' 3 (1.6 m)   Wt 210 lb (95.3 kg)   SpO2 96%   BMI 37.20 kg/m  She was weighed on the bioimpedance scale: Body mass index is 37.2 kg/m.  Body Fat%:44.4%, Visceral Fat Rating:14, Weight trend over the last 12 months: Increasing  General:  Alert, oriented and cooperative. Patient is in no acute distress.  Respiratory: Normal respiratory effort, no problems with respiration noted   Gait: able to ambulate independently  Mental Status: Normal mood and affect. Normal behavior. Normal judgment and thought content.   DIAGNOSTIC DATA REVIEWED:  BMET    Component Value Date/Time   NA 138 06/20/2024 1415   NA 142 12/11/2015 1137   K 4.5 06/20/2024 1415   CL 102 06/20/2024 1415   CO2 30 06/20/2024 1415   GLUCOSE 76 06/20/2024 1415   BUN 15 06/20/2024 1415   BUN 13 12/11/2015 1137   CREATININE 0.91 06/20/2024 1415   CALCIUM 9.4 06/20/2024 1415   GFRNONAA >60 04/20/2023 1832   GFRNONAA 62 08/05/2020 1152   GFRAA 72 08/05/2020 1152   Lab Results  Component Value Date   HGBA1C  5.7 (H) 06/20/2024   HGBA1C 5.7 (H) 12/11/2015   No results found for: INSULIN  CBC    Component Value Date/Time   WBC 7.3 06/20/2024 1415   RBC 4.67 06/20/2024 1415   HGB 13.8 06/20/2024 1415   HGB 14.3 12/11/2015 1137   HCT 41.8 06/20/2024 1415   HCT 41.0 12/11/2015 1137   PLT 213 06/20/2024 1415   PLT 211 12/11/2015 1137   MCV 89.5 06/20/2024 1415   MCV 83 12/11/2015 1137   MCH 29.6 06/20/2024 1415   MCHC 33.0 06/20/2024 1415   RDW 14.2 06/20/2024 1415   RDW 14.8 12/11/2015 1137   Iron/TIBC/Ferritin/ %Sat No results found for: IRON, TIBC, FERRITIN, IRONPCTSAT Lipid Panel     Component Value Date/Time   CHOL 217 (H) 06/20/2024 1415   CHOL 190 12/11/2015 1137   TRIG 102 06/20/2024 1415   HDL 72 06/20/2024 1415   HDL 57 12/11/2015 1137   CHOLHDL 3.0 06/20/2024 1415   VLDL 17 06/20/2017 1122   LDLCALC 125 (H) 06/20/2024 1415   Hepatic Function Panel     Component Value Date/Time   PROT 6.8 06/20/2024 1415   PROT 6.6 10/11/2022 1449   ALBUMIN 4.0 04/20/2023 1832   ALBUMIN 4.4 10/11/2022 1449   AST 19  06/20/2024 1415   ALT 14 06/20/2024 1415   ALKPHOS 64 04/20/2023 1832   BILITOT 0.6 06/20/2024 1415   BILITOT 0.3 10/11/2022 1449   BILIDIR <0.10 10/11/2022 1449      Component Value Date/Time   TSH 1.32 06/20/2024 1415     Assessment and Plan   Fibromyalgia syndrome  Vitamin D  deficiency  GAD (generalized anxiety disorder)  Episodic tension-type headache, not intractable  Primary insomnia  Chronic constipation  MDD (major depressive disorder), recurrent episode, mild (HCC)  Attention and concentration deficit  Chronic diastolic heart failure (HCC)  Adenomatous polyp of descending colon  Non-toxic multinodular goiter  Class 2 severe obesity due to excess calories with serious comorbidity and body mass index (BMI) of 37.0 to 37.9 in adult [facility])  Current BMI 37.3  Assessment and Plan Assessment & Plan Obesity Obesity management  is the primary focus due to weight gain post-pregnancy and post-hysterectomy. Current weight is 210 lbs with a body fat percentage of 44.4%. Previous weight loss strategies, including low-carb diets and GLP-1 agonists like Wegovy , were effective until insurance issues arose. Obesity impacts self-esteem, relationships, and contributes to sleep apnea and joint discomfort. The goal is to reduce body fat percentage to below 35% to lower health risks, including type 2 diabetes, heart disease, and certain cancers. - Schedule metabolism testing with an 8-hour fast and no caffeine or exercise prior to testing.  - Develop a personalized nutrition plan based on metabolism test results/labs. - Schedule follow-up appointments every 2-3 weeks for the first 3 months, then adjust frequency based on progress. - Discuss potential medication options based on test results and insurance coverage. - Provide education on protein intake and meal planning to manage hunger signals.  Obstructive sleep apnea Confirmed diagnosis of obstructive sleep apnea, likely contributing to fatigue and overall health issues. She hopes to improve symptoms through weight loss. - Incorporate weight loss strategies to potentially improve sleep apnea symptoms.  Major depressive disorder Reports depression, likely exacerbated by obesity and related health issues. The program aims to improve mental health through better nutrition and weight management. - Address depression through improved nutrition and weight management strategies.  Prediabetes Suspected prediabetes, supported by weight and dietary habits. The program will include testing for insulin  resistance/prediabetes and tailored dietary plans to address this condition. - Test for insulin  resistance during the initial assessment with usual labs. - Develop a dietary plan to manage prediabetes/insulin  resistance and prevent progression to diabetes.  Hyperlipidemia Developed  hyperlipidemia post-menopause, a common occurrence. Weight loss and dietary changes are expected to help manage cholesterol levels. - Incorporate dietary changes to manage hyperlipidemia. - Monitor cholesterol levels as part of the weight management program.  Arthralgia Experiences joint discomfort, likely related to weight and potential inflammatory conditions. Scheduled to see a rheumatologist for further evaluation, with a family history of rheumatoid arthritis. - Encourage weight loss to alleviate joint discomfort. - Follow up with rheumatology for further evaluation of potential inflammatory arthritis.        Obesity Treatment / Action Plan:  Patient will work on garnering support from family and friends to begin weight loss journey. Will work on eliminating or reducing the presence of highly palatable, calorie dense foods in the home. Will complete provided nutritional and psychosocial assessment questionnaire before the next appointment. Will be scheduled for indirect calorimetry to determine resting energy expenditure in a fasting state.  This will allow us  to create a reduced calorie, high-protein meal plan to promote loss of fat mass while  preserving muscle mass. Will think about ideas on how to incorporate physical activity into their daily routine. Will avoid skipping meals which may result in increased hunger signals and overeating at certain times. Will work on managing stress via relaxation methods as this may result in unhealthy eating patterns. Will work on reading labels, making healthier choices and watching portion sizes. Counseled on the health benefits of losing 5%-15% of total body weight. Will work on improving sleep hygiene and trying to obtain at least 7 hours of sleep. Was counseled on nutritional approaches to weight loss and benefits of reducing processed foods and consuming plant-based foods and high quality protein as part of nutritional weight management. Was  counseled on pharmacotherapy and role as an adjunct in weight management.  Will work on increasing water intake with a goal of 125 ounces for men and 91 ounces for women.   Obesity Education Performed Today:  She was weighed on the bioimpedance scale and results were discussed and documented in the synopsis.  We discussed obesity as a disease and the importance of a more detailed evaluation of all the factors contributing to the disease.  We discussed the importance of long term lifestyle changes which include nutrition, exercise and behavioral modifications as well as the importance of customizing this to her specific health and social needs.  We discussed the benefits of reaching a healthier weight to alleviate the symptoms of existing conditions and reduce the risks of the biomechanical, metabolic and psychological effects of obesity.  We reviewed the four pillars of obesity medicine and importance of using a multimodal approach.  We reviewed the basic principles in weight management.   Lorice J Schlachter appears to be in the action stage of change and states they are ready to start intensive lifestyle modifications and behavioral modifications.  I have spent 34 minutes in the care of the patient today including: 1 minutes before the visit reviewing and preparing the chart. 28 minutes face-to-face assessing and reviewing listed medical problems as outlined in obesity care plan, providing nutritional and behavioral counseling on topics outlined in the obesity care plan, independently interpreting test results and goals of care, as described in assessment and plan, reviewing and discussing biometric information and progress, and reviewing latest PCP notes and specialist consultations 5 minutes after the visit updating chart and documentation of encounter.  Reviewed by clinician on day of visit: allergies, medications, problem list, medical history, surgical history, family history, social  history, and previous encounter notes pertinent to obesity diagnosis.   Clint Biello,PA-C

## 2024-07-18 ENCOUNTER — Institutional Professional Consult (permissible substitution) (INDEPENDENT_AMBULATORY_CARE_PROVIDER_SITE_OTHER): Admitting: Family Medicine

## 2024-08-05 ENCOUNTER — Other Ambulatory Visit (HOSPITAL_COMMUNITY): Payer: Self-pay

## 2024-08-06 ENCOUNTER — Other Ambulatory Visit: Payer: Self-pay | Admitting: Family Medicine

## 2024-08-06 DIAGNOSIS — F331 Major depressive disorder, recurrent, moderate: Secondary | ICD-10-CM

## 2024-08-27 ENCOUNTER — Other Ambulatory Visit (HOSPITAL_COMMUNITY): Payer: Self-pay

## 2024-08-27 NOTE — Patient Instructions (Signed)
 Preventive Care 58-64 Years Old, Female  Preventive care refers to lifestyle choices and visits with your health care provider that can promote health and wellness. Preventive care visits are also called wellness exams.  What can I expect for my preventive care visit?  Counseling  Your health care provider may ask you questions about your:  Medical history, including:  Past medical problems.  Family medical history.  Pregnancy history.  Current health, including:  Menstrual cycle.  Method of birth control.  Emotional well-being.  Home life and relationship well-being.  Sexual activity and sexual health.  Lifestyle, including:  Alcohol, nicotine or tobacco, and drug use.  Access to firearms.  Diet, exercise, and sleep habits.  Work and work Astronomer.  Sunscreen use.  Safety issues such as seatbelt and bike helmet use.  Physical exam  Your health care provider will check your:  Height and weight. These may be used to calculate your BMI (body mass index). BMI is a measurement that tells if you are at a healthy weight.  Waist circumference. This measures the distance around your waistline. This measurement also tells if you are at a healthy weight and may help predict your risk of certain diseases, such as type 2 diabetes and high blood pressure.  Heart rate and blood pressure.  Body temperature.  Skin for abnormal spots.  What immunizations do I need?    Vaccines are usually given at various ages, according to a schedule. Your health care provider will recommend vaccines for you based on your age, medical history, and lifestyle or other factors, such as travel or where you work.  What tests do I need?  Screening  Your health care provider may recommend screening tests for certain conditions. This may include:  Lipid and cholesterol levels.  Diabetes screening. This is done by checking your blood sugar (glucose) after you have not eaten for a while (fasting).  Pelvic exam and Pap test.  Hepatitis B test.  Hepatitis C  test.  HIV (human immunodeficiency virus) test.  STI (sexually transmitted infection) testing, if you are at risk.  Lung cancer screening.  Colorectal cancer screening.  Mammogram. Talk with your health care provider about when you should start having regular mammograms. This may depend on whether you have a family history of breast cancer.  BRCA-related cancer screening. This may be done if you have a family history of breast, ovarian, tubal, or peritoneal cancers.  Bone density scan. This is done to screen for osteoporosis.  Talk with your health care provider about your test results, treatment options, and if necessary, the need for more tests.  Follow these instructions at home:  Eating and drinking    Eat a diet that includes fresh fruits and vegetables, whole grains, lean protein, and low-fat dairy products.  Take vitamin and mineral supplements as recommended by your health care provider.  Do not drink alcohol if:  Your health care provider tells you not to drink.  You are pregnant, may be pregnant, or are planning to become pregnant.  If you drink alcohol:  Limit how much you have to 0-1 drink a day.  Know how much alcohol is in your drink. In the [place] one drink equals one 12 oz bottle of beer (355 mL), one 5 oz glass of wine (148 mL), or one 1 oz glass of hard liquor (44 mL).  Lifestyle  Brush your teeth every morning and night with fluoride toothpaste. Floss one time each day.  Exercise for at least  30 minutes 5 or more days each week.  Do not use any products that contain nicotine or tobacco. These products include cigarettes, chewing tobacco, and vaping devices, such as e-cigarettes. If you need help quitting, ask your health care provider.  Do not use drugs.  If you are sexually active, practice safe sex. Use a condom or other form of protection to prevent STIs.  If you do not wish to become pregnant, use a form of birth control. If you plan to become pregnant, see your health care provider for a  prepregnancy visit.  Take aspirin only as told by your health care provider. Make sure that you understand how much to take and what form to take. Work with your health care provider to find out whether it is safe and beneficial for you to take aspirin daily.  Find healthy ways to manage stress, such as:  Meditation, yoga, or listening to music.  Journaling.  Talking to a trusted person.  Spending time with friends and family.  Minimize exposure to UV radiation to reduce your risk of skin cancer.  Safety  Always wear your seat belt while driving or riding in a vehicle.  Do not drive:  If you have been drinking alcohol. Do not ride with someone who has been drinking.  When you are tired or distracted.  While texting.  If you have been using any mind-altering substances or drugs.  Wear a helmet and other protective equipment during sports activities.  If you have firearms in your house, make sure you follow all gun safety procedures.  Seek help if you have been physically or sexually abused.  What's next?  Visit your health care provider once a year for an annual wellness visit.  Ask your health care provider how often you should have your eyes and teeth checked.  Stay up to date on all vaccines.  This information is not intended to replace advice given to you by your health care provider. Make sure you discuss any questions you have with your health care provider.  Document Revised: 06/02/2021 Document Reviewed: 06/02/2021  Elsevier Patient Education  2024 [employer].

## 2024-08-28 ENCOUNTER — Encounter: Payer: Self-pay | Admitting: Family Medicine

## 2024-08-28 ENCOUNTER — Ambulatory Visit (INDEPENDENT_AMBULATORY_CARE_PROVIDER_SITE_OTHER): Payer: Self-pay | Admitting: Family Medicine

## 2024-08-28 VITALS — BP 110/68 | HR 98 | Resp 16 | Ht 63.0 in | Wt 216.2 lb

## 2024-08-28 DIAGNOSIS — Z1231 Encounter for screening mammogram for malignant neoplasm of breast: Secondary | ICD-10-CM | POA: Diagnosis not present

## 2024-08-28 DIAGNOSIS — Z0000 Encounter for general adult medical examination without abnormal findings: Secondary | ICD-10-CM | POA: Diagnosis not present

## 2024-08-28 DIAGNOSIS — Z13820 Encounter for screening for osteoporosis: Secondary | ICD-10-CM

## 2024-08-28 DIAGNOSIS — Z23 Encounter for immunization: Secondary | ICD-10-CM | POA: Diagnosis not present

## 2024-08-28 DIAGNOSIS — Z113 Encounter for screening for infections with a predominantly sexual mode of transmission: Secondary | ICD-10-CM

## 2024-08-28 DIAGNOSIS — E2839 Other primary ovarian failure: Secondary | ICD-10-CM

## 2024-08-28 MED ORDER — COVID-19 MRNA VAC-TRIS(PFIZER) 30 MCG/0.3ML IM SUSY: 0.3000 mL | PREFILLED_SYRINGE | Freq: Once | INTRAMUSCULAR | 0 refills | Status: AC

## 2024-08-28 NOTE — Progress Notes (Signed)
 Name: Teresa Gallagher   MRN: [id number]    DOB: [birth year]   Date:08/28/2024       Progress Note  Subjective  Chief Complaint  Chief Complaint  Patient presents with   Annual Exam    HPI  Patient presents for annual CPE.  Diet: she had a [facility] consultation and is going back in October, waiting to be approved for medications. Currently eating two meals a day. She avoids sweats  Exercise: discussed 150 minutes per week   Last Eye Exam: completed Last Dental Exam: completed  Flowsheet Row Office Visit from 06/24/2024 in [facility]  AUDIT-C Score 2    Depression: Phq 9 is  positive    08/28/2024    2:59 PM 06/24/2024    9:46 AM 11/29/2023    1:58 PM 08/30/2023    8:36 AM 08/23/2023    8:27 AM  Depression screen PHQ 2/9  Decreased Interest 2 2 1 3 3   Down, Depressed, Hopeless 2 2 1 2 2   PHQ - 2 Score 4 4 2 5 5   Altered sleeping 2 2 2 2 2   Tired, decreased energy 2 2 2 3 3   Change in appetite 2 2 1 3 3   Feeling bad or failure about yourself  2 2 1 3 2   Trouble concentrating 2 2 1 2 3   Moving slowly or fidgety/restless 0 0 0 0 0  Suicidal thoughts 0 0 0 0 0  PHQ-9 Score 14 14 9 18 18   Difficult doing work/chores Very difficult Very difficult Extremely dIfficult  Extremely dIfficult   Hypertension: BP Readings from Last 3 Encounters:  08/28/24 110/68  07/17/24 122/83  06/24/24 118/76   Obesity: Wt Readings from Last 3 Encounters:  08/28/24 216 lb 3.2 oz (98.1 kg)  07/17/24 210 lb (95.3 kg)  06/24/24 210 lb 8 oz (95.5 kg)   BMI Readings from Last 3 Encounters:  08/28/24 38.30 kg/m  07/17/24 37.20 kg/m  06/24/24 35.03 kg/m     Vaccines: reviewed with the patient.   Hep C Screening: completed STD testing and prevention (HIV/chl/gon/syphilis): N/A Intimate partner violence: negative screen  Sexual History : sexually active same partner for the past year  Menstrual History/LMP/Abnormal Bleeding: s/p hysterectomy  Discussed  importance of follow up if any post-menopausal bleeding: not applicable  Incontinence Symptoms: negative for symptoms   Breast cancer:  - Last Mammogram: she will schedule it  - BRCA gene screening: two aunts on father's side had breast cancer and one on mother's side   Osteoporosis Prevention : Discussed high calcium and vitamin D  supplementation, weight bearing exercises Bone density :yes   Cervical cancer screening: not applicable due to hysterectomy  Skin cancer: Discussed monitoring for atypical lesions  Colorectal cancer: up to date    Lung cancer:  Low Dose CT Chest recommended if Age 37-80 years, 20 pack-year currently smoking OR have quit w/in 15years. Patient does not qualify for screen   ECG: 2024  Advanced Care Planning: A voluntary discussion about advance care planning including the explanation and discussion of advance directives.  Discussed health care proxy and Living will, and the patient was able to identify a health care proxy as daughter Charls .  Patient does not have a living will and power of attorney of health care   Patient Active Problem List   Diagnosis Date Noted   Non-toxic multinodular goiter 11/29/2023   Adenomatous polyp of descending colon 11/08/2022   Rectal polyp 11/08/2022  Chronic diastolic heart failure (HCC) 06/10/2022   Breast lump on right side at 10 o'clock position 06/10/2022   Circadian rhythm sleep disorder, shift work type 03/23/2021   Attention and concentration deficit 01/20/2020   Alcohol use disorder, mild, in early remission 10/29/2019   Old tear of meniscus of right knee 03/20/2019   SOB (shortness of breath) 08/29/2017   Episodic tension-type headache, not intractable 12/20/2016   History of blood transfusion 07/30/2015   GAD (generalized anxiety disorder) 07/30/2015   Chronic constipation 07/13/2015   Insomnia 07/13/2015   MDD (major depressive disorder), recurrent episode, mild (HCC) 07/13/2015   Fibromyalgia syndrome  07/13/2015   Herpes simplex type 2 infection 07/13/2015   Morbid obesity (HCC) 07/13/2015   Sleep apnea with hypersomnolence 07/13/2015   Vitamin D  deficiency 07/13/2015    Past Surgical History:  Procedure Laterality Date   ABDOMINAL HYSTERECTOMY     BUNIONECTOMY Right 11/07/2014   Dr. Primus at [facility]    CARDIAC CATHETERIZATION     CARPAL TUNNEL RELEASE Left 12/19/2010   CESAREAN SECTION     COLONOSCOPY WITH PROPOFOL  N/A 11/08/2022   Procedure: COLONOSCOPY WITH PROPOFOL ;  Surgeon: Unk Corinn Skiff, MD;  Location: [facility] ENDOSCOPY;  Service: Gastroenterology;  Laterality: N/A;   LAPAROSCOPIC HYSTERECTOMY     followed by 3 cuff repairs   LIPOMA EXCISION  12/19/2010   REFRACTIVE SURGERY Bilateral    RIGHT/LEFT HEART CATH AND CORONARY ANGIOGRAPHY N/A 09/02/2021   Procedure: RIGHT/LEFT HEART CATH AND CORONARY ANGIOGRAPHY;  Surgeon: Cherrie Toribio SAUNDERS, MD;  Location: [facility] INVASIVE CV LAB;  Service: Cardiovascular;  Laterality: N/A;   TONSILLECTOMY      Family History  Problem Relation Age of Onset   Heart attack Father    Alcohol abuse Father    COPD Sister        end stages   Bipolar disorder Brother    Depression Brother    Anxiety disorder Brother    Arthritis Brother    Obesity Brother    Bipolar disorder Brother    Depression Brother    Alcohol abuse Brother    Heart attack Paternal Uncle    Breast cancer Paternal Aunt    Breast cancer Maternal Aunt    Intellectual disability Son     Social History   Socioeconomic History   Marital status: Divorced    Spouse name: larry   Number of children: 3   Years of education: Not on file   Highest education level: Bachelor's degree (e.g., BA, AB, BS)  Occupational History   Not on file  Tobacco Use   Smoking status: Never   Smokeless tobacco: Never  Vaping Use   Vaping status: Never Used  Substance and Sexual Activity   Alcohol use: Not Currently   Drug use: No   Sexual activity: Yes  Other Topics  Concern   Not on file  Social History Narrative   She has been on disability since end of 2021   She has a grown son that has Down's syndrome that lives with her       Social Drivers of Health   Financial Resource Strain: Medium Risk (08/27/2024)   [facility]   Overall Financial Resource Strain (CARDIA)    Difficulty of Paying Living Expenses: Somewhat hard  Food Insecurity: Food Insecurity Present (08/27/2024)   Received from [facility]   Hunger Vital Sign    Within the past 12 months, you worried that your  food would run out before you got the money to buy more.: Sometimes true    Within the past 12 months, the food you bought just didn't last and you didn't have money to get more.: Sometimes true  Transportation Needs: No Transportation Needs (08/27/2024)   Received from [facility] - Transportation    In the past 12 months, has lack of transportation kept you from medical appointments or from getting medications?: No    Lack of Transportation (Non-Medical): No  Physical Activity: Insufficiently Active (06/23/2024)   Exercise Vital Sign    Days of Exercise per Week: 1 day    Minutes of Exercise per Session: 20 min  Stress: Stress Concern Present (06/23/2024)   [employer] [facility] [employer] - Occupational Stress Questionnaire    Feeling of Stress: Rather much  Social Connections: Socially Isolated (06/23/2024)   Social Connection and Isolation Panel    Frequency of Communication with Friends and Family: Twice a week    Frequency of Social Gatherings with Friends and Family: Never    Attends Religious Services: Never    Database administrator or Organizations: No    Attends Engineer, structural: Not on file    Marital Status: Separated  Intimate Partner Violence: Not At Risk (08/23/2023)   Humiliation, Afraid, Rape, and Kick questionnaire    Fear of Current or Ex-Partner: No    Emotionally  Abused: No    Physically Abused: No    Sexually Abused: No     Current Outpatient Medications:    AUVELITY  45-105 MG TBCR, Take 1 tablet by mouth every morning., Disp: 90 tablet, Rfl: 1   baclofen  (LIORESAL ) 10 MG tablet, TAKE 1 TABLET BY MOUTH THREE TIMES A DAY AS NEEDED FOR MUSCLE SPASMS, Disp: 30 tablet, Rfl: 0   furosemide  (LASIX ) 20 MG tablet, Take 1 tablet (20 mg total) by mouth daily as needed for fluid or edema., Disp: 90 tablet, Rfl: 0   Glucos-Chond-Hyal Ac-Ca Fructo (MOVE FREE JOINT HEALTH ADVANCE PO), Take 1 tablet by mouth daily., Disp: , Rfl:    Glycerin-Hypromellose-PEG 400 (ARTIFICIAL TEARS) 0.2-0.2-1 % SOLN, Place 1 drop into both eyes daily as needed (dry eyes)., Disp: , Rfl:    hydroxychloroquine (PLAQUENIL) 200 MG tablet, Take 200 mg by mouth 2 (two) times daily., Disp: , Rfl:    hydrOXYzine  (VISTARIL ) 25 MG capsule, Take 1 capsule (25 mg total) by mouth 2 (two) times daily as needed., Disp: 180 capsule, Rfl: 0   meloxicam (MOBIC) 15 MG tablet, Take 15 mg by mouth daily., Disp: , Rfl:    modafinil  (PROVIGIL ) 200 MG tablet, Take 1 tablet (200 mg total) by mouth daily., Disp: 90 tablet, Rfl: 0   Multiple Vitamin (MULTIVITAMIN) tablet, Take 1 tablet by mouth daily., Disp: , Rfl:    potassium chloride  SA (KLOR-CON  M20) 20 MEQ tablet, TAKE 1 TABLET (20 MEQ TOTAL) BY MOUTH DAILY AS NEEDED. WHEN YOU TAKE LASIX , Disp: 30 tablet, Rfl: 0   pregabalin  (LYRICA ) 50 MG capsule, Take 1 capsule (50 mg total) by mouth 2 (two) times daily., Disp: 180 capsule, Rfl: 0   Semaglutide -Weight Management (WEGOVY ) 0.25 MG/0.5ML SOAJ, Inject 0.25 mg into the skin once a week., Disp: 2 mL, Rfl: 0   tiZANidine (ZANAFLEX) 4 MG tablet, Take 4 mg by mouth every 8 (eight) hours as needed for muscle spasms., Disp: , Rfl:    traZODone  (DESYREL ) 50 MG tablet, Take 50-100 mg by mouth at bedtime  as needed., Disp: , Rfl:    valACYclovir  (VALTREX ) 500 MG tablet, TAKE 1 TABLET (500 MG TOTAL) BY MOUTH DAILY. AND  TWICE DAILY FOR OUTBREAKS, Disp: 180 tablet, Rfl: 1   zinc gluconate 50 MG tablet, Take 50 mg by mouth daily., Disp: , Rfl:   No Known Allergies   ROS  Constitutional: Negative for fever , positive for weight change.  Respiratory: Negative for cough and shortness of breath.   Cardiovascular: Negative for chest pain or palpitations.  Gastrointestinal: Negative for abdominal pain, no bowel changes.  Musculoskeletal: Negative for gait problem , positive for joint pain  Skin: Negative for rash.  Neurological: Negative for dizziness, positive for headache.  No other specific complaints in a complete review of systems (except as listed in HPI above).   Objective  Vitals:   08/28/24 1514  BP: 110/68  Pulse: 98  Resp: 16  SpO2: 99%  Weight: 216 lb 3.2 oz (98.1 kg)  Height: 5' 3 (1.6 m)    Body mass index is 38.3 kg/m.  Physical Exam  Constitutional: Patient appears well-developed and well-nourished. No distress.  HENT: Head: Normocephalic and atraumatic. Ears: B TMs ok, no erythema or effusion; Nose: Nose normal. Mouth/Throat: Oropharynx is clear and moist. No oropharyngeal exudate.  Eyes: Conjunctivae and EOM are normal. Pupils are equal, round, and reactive to light. No scleral icterus.  Neck: Normal range of motion. Neck supple. No JVD present. No thyromegaly present.  Cardiovascular: Normal rate, regular rhythm and normal heart sounds.  No murmur heard. No BLE edema. Pulmonary/Chest: Effort normal and breath sounds normal. No respiratory distress. Abdominal: Soft. Bowel sounds are normal, no distension. There is no tenderness. no masses Breast: no lumps or masses, no nipple discharge or rashes FEMALE GENITALIA:  Not done  RECTAL: not done  Musculoskeletal: Normal range of motion, no joint effusions. No gross deformities Neurological: he is alert and oriented to person, place, and time. No cranial nerve deficit. Coordination, balance, strength, speech and gait are normal.   Skin: Skin is warm and dry. No rash noted. No erythema.  Psychiatric: Patient has a normal mood and affect. behavior is normal. Judgment and thought content normal.     Assessment & Plan  1. Well adult exam (Primary)  - COVID-19 mRNA vaccine, Pfizer, (COMIRNATY) syringe; Inject 0.3 mLs into the muscle once for 1 dose.  Dispense: 0.3 mL; Refill: 0 - HIV Antibody (routine testing w rflx) - RPR - DG Bone Density; Future  2. Encounter for screening mammogram for malignant neoplasm of breast  - MM 3D SCREENING MAMMOGRAM BILATERAL BREAST; Future  3. Need for COVID-19 vaccine  - COVID-19 mRNA vaccine, Pfizer, (COMIRNATY) syringe; Inject 0.3 mLs into the muscle once for 1 dose.  Dispense: 0.3 mL; Refill: 0  4. Screening examination for STI  - HIV Antibody (routine testing w rflx) - [facility]  5. Ovarian failure  - DG Bone Density; Future  6. Osteoporosis screening  - DG Bone Density; Future    -USPSTF grade A and B recommendations reviewed with patient; age-appropriate recommendations, preventive care, screening tests, etc discussed and encouraged; healthy living encouraged; see AVS for patient education given to patient -Discussed importance of 150 minutes of physical activity weekly, eat two servings of fish weekly, eat one serving of tree nuts ( cashews, pistachios, pecans, almonds.SABRA) every other day, eat 6 servings of fruit/vegetables daily and drink plenty of water and avoid sweet beverages.   -Reviewed Health Maintenance: Yes.

## 2024-08-29 LAB — HIV ANTIBODY (ROUTINE TESTING W REFLEX)
HIV 1&2 Ab, 4th Generation: NONREACTIVE
HIV FINAL INTERPRETATION: NEGATIVE

## 2024-08-29 LAB — RPR: RPR Ser Ql: NONREACTIVE

## 2024-08-30 ENCOUNTER — Other Ambulatory Visit (HOSPITAL_COMMUNITY): Payer: Self-pay

## 2024-09-02 ENCOUNTER — Other Ambulatory Visit (HOSPITAL_COMMUNITY): Payer: Self-pay

## 2024-09-02 ENCOUNTER — Ambulatory Visit: Payer: Self-pay | Admitting: Family Medicine

## 2024-09-16 ENCOUNTER — Other Ambulatory Visit: Payer: Self-pay | Admitting: Family Medicine

## 2024-09-16 DIAGNOSIS — I5032 Chronic diastolic (congestive) heart failure: Secondary | ICD-10-CM

## 2024-09-24 ENCOUNTER — Encounter: Payer: Self-pay | Admitting: Family Medicine

## 2024-09-24 ENCOUNTER — Ambulatory Visit: Admitting: Family Medicine

## 2024-09-24 VITALS — BP 106/74 | HR 94 | Resp 16 | Ht 63.0 in | Wt 220.8 lb

## 2024-09-24 DIAGNOSIS — M545 Low back pain, unspecified: Secondary | ICD-10-CM

## 2024-09-24 DIAGNOSIS — Z23 Encounter for immunization: Secondary | ICD-10-CM

## 2024-09-24 DIAGNOSIS — M25561 Pain in right knee: Secondary | ICD-10-CM

## 2024-09-24 DIAGNOSIS — M797 Fibromyalgia: Secondary | ICD-10-CM

## 2024-09-24 DIAGNOSIS — G4730 Sleep apnea, unspecified: Secondary | ICD-10-CM

## 2024-09-24 DIAGNOSIS — I5032 Chronic diastolic (congestive) heart failure: Secondary | ICD-10-CM | POA: Diagnosis not present

## 2024-09-24 DIAGNOSIS — Z1231 Encounter for screening mammogram for malignant neoplasm of breast: Secondary | ICD-10-CM

## 2024-09-24 DIAGNOSIS — M0600 Rheumatoid arthritis without rheumatoid factor, unspecified site: Secondary | ICD-10-CM

## 2024-09-24 DIAGNOSIS — E559 Vitamin D deficiency, unspecified: Secondary | ICD-10-CM

## 2024-09-24 DIAGNOSIS — M3500 Sicca syndrome, unspecified: Secondary | ICD-10-CM

## 2024-09-24 DIAGNOSIS — G8929 Other chronic pain: Secondary | ICD-10-CM

## 2024-09-24 DIAGNOSIS — F331 Major depressive disorder, recurrent, moderate: Secondary | ICD-10-CM

## 2024-09-24 DIAGNOSIS — Z13820 Encounter for screening for osteoporosis: Secondary | ICD-10-CM

## 2024-09-24 DIAGNOSIS — G4710 Hypersomnia, unspecified: Secondary | ICD-10-CM

## 2024-09-24 MED ORDER — FUROSEMIDE 20 MG PO TABS: 20.0000 mg | ORAL_TABLET | Freq: Every day | ORAL | 1 refills | Status: AC | PRN

## 2024-09-24 MED ORDER — PREGABALIN 75 MG PO CAPS: 75.0000 mg | ORAL_CAPSULE | Freq: Two times a day (BID) | ORAL | 0 refills | Status: DC

## 2024-09-24 MED ORDER — MODAFINIL 200 MG PO TABS: 200.0000 mg | ORAL_TABLET | Freq: Every day | ORAL | 0 refills | Status: DC

## 2024-09-24 MED ORDER — TIZANIDINE HCL 4 MG PO TABS: 4.0000 mg | ORAL_TABLET | Freq: Two times a day (BID) | ORAL | 1 refills | Status: DC | PRN

## 2024-09-24 MED ORDER — POTASSIUM CHLORIDE CRYS ER 20 MEQ PO TBCR: 20.0000 meq | EXTENDED_RELEASE_TABLET | Freq: Every day | ORAL | 1 refills | Status: AC

## 2024-09-24 NOTE — Progress Notes (Signed)
 Name: Teresa Gallagher   MRN: [id number]    DOB: 07/15/1961   Date:09/24/2024       Progress Note  Subjective  Chief Complaint  Chief Complaint  Patient presents with   Medical Management of Chronic Issues   Discussed the use of AI scribe software for clinical note transcription with the patient, who gave verbal consent to proceed.  History of Present Illness Teresa Gallagher is a 63 year old female with rheumatoid arthritis , Sjogrens and fibromyalgia who presents for follow-up of her chronic conditions and medication management.  She is centralizing her healthcare and coordinating her mammogram and bone density tests to be conducted at a single location. She is also seeking a referral to a local orthopedic specialist for her knee, back, and hand issues.  She was recently diagnosed with rheumatoid arthritis with a negative rheumatoid factor, involving multiple joints, and has been experiencing chronic pain. Hydroxychloroquine 200 mg twice a day has improved her hand pain significantly within a week. She continues to experience back, hip, and knee pain, with the knee being particularly problematic due to a past meniscus removal and would like to see a local Ortho for that problem  She has fibromyalgia and is currently taking pregabalin  75 mg twice a day, which helps with her symptoms. She uses tizanidine as needed for muscle relaxation, primarily at night, and occasionally during the day if pain is severe.  She has prediabetes and is working with a dietitian to manage her condition. She is not currently on any specific medication for prediabetes but is focusing on dietary changes.  She had Diastolic CHF  and is taking Lasix  and potassium . She has been experiencing shortness of breath and previously used an albuterol  inhaler however does not have a history of asthma. Explained CHF may cause wheezing and sob.   She has major depression and anxiety, and is currently seeing a psychiatrist that  manages her medication regiment . She also takes hydroxyzine  for anxiety.  She has a history of obesity and was previously on Wegovy , which was effective until her insurance required participation in a weight management program. She is planning to enroll in a program in [place].  OSA with daytime somnolence, uses cpap and taking Modafinil    Her recent blood work showed a high B12 level, low vitamin D , and prediabetes. She continues to take vitamin D  supplements. Her TSH was normal, and she has a history of a nodular goiter, for which she is seeing a specialist.    Patient Active Problem List   Diagnosis Date Noted   Non-toxic multinodular goiter 11/29/2023   Adenomatous polyp of descending colon 11/08/2022   Rectal polyp 11/08/2022   Chronic diastolic heart failure (HCC) 06/10/2022   Breast lump on right side at 10 o'clock position 06/10/2022   Circadian rhythm sleep disorder, shift work type 03/23/2021   Attention and concentration deficit 01/20/2020   Alcohol use disorder, mild, in early remission 10/29/2019   Old tear of meniscus of right knee 03/20/2019   SOB (shortness of breath) 08/29/2017   Episodic tension-type headache, not intractable 12/20/2016   History of blood transfusion 07/30/2015   GAD (generalized anxiety disorder) 07/30/2015   Chronic constipation 07/13/2015   Insomnia 07/13/2015   MDD (major depressive disorder), recurrent episode, mild (HCC) 07/13/2015   Fibromyalgia syndrome 07/13/2015   Herpes simplex type 2 infection 07/13/2015   Morbid obesity (HCC) 07/13/2015   Sleep apnea with hypersomnolence 07/13/2015   Vitamin D  deficiency 07/13/2015  Past Surgical History:  Procedure Laterality Date   ABDOMINAL HYSTERECTOMY     BUNIONECTOMY Right 11/07/2014   Dr. Primus at [facility]    CARDIAC CATHETERIZATION     CARPAL TUNNEL RELEASE Left 12/19/2010   CESAREAN SECTION     COLONOSCOPY WITH PROPOFOL  N/A 11/08/2022   Procedure: COLONOSCOPY WITH  PROPOFOL ;  Surgeon: Unk Corinn Skiff, MD;  Location: [facility] ENDOSCOPY;  Service: Gastroenterology;  Laterality: N/A;   LAPAROSCOPIC HYSTERECTOMY     followed by 3 cuff repairs   LIPOMA EXCISION  12/19/2010   REFRACTIVE SURGERY Bilateral    RIGHT/LEFT HEART CATH AND CORONARY ANGIOGRAPHY N/A 09/02/2021   Procedure: RIGHT/LEFT HEART CATH AND CORONARY ANGIOGRAPHY;  Surgeon: Cherrie Toribio SAUNDERS, MD;  Location: [facility] INVASIVE CV LAB;  Service: Cardiovascular;  Laterality: N/A;   TONSILLECTOMY      Family History  Problem Relation Age of Onset   Heart attack Father    Alcohol abuse Father    COPD Sister        end stages   Bipolar disorder Brother    Depression Brother    Anxiety disorder Brother    Arthritis Brother    Obesity Brother    Bipolar disorder Brother    Depression Brother    Alcohol abuse Brother    Heart attack Paternal Uncle    Breast cancer Paternal Aunt    Breast cancer Maternal Aunt    Intellectual disability Son     Social History   Tobacco Use   Smoking status: Never   Smokeless tobacco: Never  Substance Use Topics   Alcohol use: Not Currently     Current Outpatient Medications:    AUVELITY  45-105 MG TBCR, Take 1 tablet by mouth every morning., Disp: 90 tablet, Rfl: 1   baclofen  (LIORESAL ) 10 MG tablet, TAKE 1 TABLET BY MOUTH THREE TIMES A DAY AS NEEDED FOR MUSCLE SPASMS, Disp: 30 tablet, Rfl: 0   furosemide  (LASIX ) 20 MG tablet, Take 1 tablet (20 mg total) by mouth daily as needed for fluid or edema., Disp: 90 tablet, Rfl: 0   Glucos-Chond-Hyal Ac-Ca Fructo (MOVE FREE JOINT HEALTH ADVANCE PO), Take 1 tablet by mouth daily., Disp: , Rfl:    Glycerin-Hypromellose-PEG 400 (ARTIFICIAL TEARS) 0.2-0.2-1 % SOLN, Place 1 drop into both eyes daily as needed (dry eyes)., Disp: , Rfl:    hydroxychloroquine (PLAQUENIL) 200 MG tablet, Take 200 mg by mouth 2 (two) times daily., Disp: , Rfl:    hydrOXYzine  (VISTARIL ) 25 MG capsule, Take 1 capsule (25 mg total) by mouth 2  (two) times daily as needed., Disp: 180 capsule, Rfl: 0   meloxicam (MOBIC) 15 MG tablet, Take 15 mg by mouth daily., Disp: , Rfl:    modafinil  (PROVIGIL ) 200 MG tablet, Take 1 tablet (200 mg total) by mouth daily., Disp: 90 tablet, Rfl: 0   Multiple Vitamin (MULTIVITAMIN) tablet, Take 1 tablet by mouth daily., Disp: , Rfl:    potassium chloride  SA (KLOR-CON  M20) 20 MEQ tablet, TAKE 1 TABLET (20 MEQ TOTAL) BY MOUTH DAILY AS NEEDED. WHEN YOU TAKE LASIX , Disp: 30 tablet, Rfl: 0   pregabalin  (LYRICA ) 50 MG capsule, Take 1 capsule (50 mg total) by mouth 2 (two) times daily., Disp: 180 capsule, Rfl: 0   Semaglutide -Weight Management (WEGOVY ) 0.25 MG/0.5ML SOAJ, Inject 0.25 mg into the skin once a week., Disp: 2 mL, Rfl: 0   tiZANidine (ZANAFLEX) 4 MG tablet, Take 4 mg by mouth every 8 (eight) hours as needed for muscle  spasms., Disp: , Rfl:    traZODone  (DESYREL ) 50 MG tablet, Take 50-100 mg by mouth at bedtime as needed., Disp: , Rfl:    valACYclovir  (VALTREX ) 500 MG tablet, TAKE 1 TABLET (500 MG TOTAL) BY MOUTH DAILY. AND TWICE DAILY FOR OUTBREAKS, Disp: 180 tablet, Rfl: 1   zinc gluconate 50 MG tablet, Take 50 mg by mouth daily., Disp: , Rfl:   No Known Allergies  I personally reviewed active problem list, medication list, allergies with the patient/caregiver today.   ROS  Ten systems reviewed and is negative except as mentioned in HPI    Objective Physical Exam MEASUREMENTS: BMI- 39.75. CONSTITUTIONAL: Patient appears well-developed and well-nourished.  No distress. HEENT: Head atraumatic, normocephalic, neck supple. CARDIOVASCULAR: Normal rate, regular rhythm and normal heart sounds.  No murmur heard. No BLE edema. PULMONARY: Effort normal and breath sounds normal. No respiratory distress. ABDOMINAL: There is no tenderness or distention. MUSCULOSKELETAL: Normal gait. Without gross motor or sensory deficit. PSYCHIATRIC: Patient has a normal mood and affect. behavior is normal. Judgment  and thought content normal.  Vitals:   09/24/24 0957  BP: 106/74  Pulse: 94  Resp: 16  SpO2: 98%  Weight: 220 lb 12.8 oz (100.2 kg)  Height: 5' 3 (1.6 m)    Body mass index is 39.11 kg/m.  Recent Results (from the past 2160 hours)  HIV Antibody (routine testing w rflx)     Status: None   Collection Time: 08/28/24  4:00 PM  Result Value Ref Range   HIV FINAL INTERPRETATION HIV NEGATIVE     Comment: HIV-1 antigen and HIV-1/HIV-2 antibodies were not detected. There is no laboratory evidence of HIV infection.    HIV 1&2 Ab, 4th Generation NON-REACTIVE NON-REACTIVE  RPR     Status: None   Collection Time: 08/28/24  4:00 PM  Result Value Ref Range   RPR Ser Ql NON-REACTIVE NON-REACTIVE    Comment: . No laboratory evidence of syphilis. If recent exposure is suspected, submit a new sample in 2-4 weeks. .     Diabetic Foot Exam:     PHQ2/9:    09/24/2024    9:55 AM 08/28/2024    2:59 PM 06/24/2024    9:46 AM 11/29/2023    1:58 PM 08/30/2023    8:36 AM  Depression screen PHQ 2/9  Decreased Interest 2 2 2 1 3   Down, Depressed, Hopeless 2 2 2 1 2   PHQ - 2 Score 4 4 4 2 5   Altered sleeping 2 2 2 2 2   Tired, decreased energy 2 2 2 2 3   Change in appetite 2 2 2 1 3   Feeling bad or failure about yourself  2 2 2 1 3   Trouble concentrating 2 2 2 1 2   Moving slowly or fidgety/restless 0 0 0 0 0  Suicidal thoughts 0 0 0 0 0  PHQ-9 Score 14 14 14 9 18   Difficult doing work/chores Very difficult Very difficult Very difficult Extremely dIfficult     phq 9 is positive  Fall Risk:    09/24/2024    9:52 AM 08/28/2024    2:59 PM 06/24/2024    9:46 AM 11/29/2023    1:58 PM 08/30/2023    8:32 AM  Fall Risk   Falls in the past year? 0 0 0 0 1  Number falls in past yr: 0 0 0 0 0  Injury with Fall? 0 0 0 0 0  Risk for fall due to : No Fall Risks No  Fall Risks No Fall Risks No Fall Risks No Fall Risks  Follow up Falls evaluation completed Falls evaluation completed Falls evaluation  completed Falls prevention discussed;Education provided;Falls evaluation completed Falls prevention discussed     Assessment & Plan Rheumatoid arthritis, seronegative with Sjogren's syndrome Rheumatoid arthritis with negative rheumatoid factor, involving multiple joints. Sjogren's syndrome with dryness symptoms. Improvement in hand pain with hydroxychloroquine. Discussed potential immunosuppression risks. - Continue hydroxychloroquine 200 mg twice a day. - Monitor for signs of immunosuppression.  Fibromyalgia Chronic fibromyalgia managed with pregabalin  and muscle relaxants. Improvement with current regimen. - Increase pregabalin  to 75 mg twice a day. - Prescribe tizanidine, 100 tablets for 3 months, to be taken at night or twice a day as needed. - Discontinue baclofen .  Chronic right knee pain, status post meniscus tear Chronic right knee pain affecting gait and causing additional pain in hip and back. - Refer to [facility] for evaluation of chronic right knee pain.  Chronic low back pain and left hip pain, likely secondary to gait abnormality Chronic low back and left hip pain likely related to altered gait from right knee issues. - Include in referral to [facility] for comprehensive evaluation.  Chronic diastolic heart failure Chronic diastolic heart failure managed with Lasix . Follows up with cardiologist. - Continue Lasix  as prescribed. - Follow up with cardiologist as scheduled.  Moderate episode of recurrent MDD  and anxiety disorder Depression and anxiety managed by psychiatrist. Current medications include modafinil  and hydroxyzine . Depression score remains high. - Continue modafinil  200 mg once a day. - Continue hydroxyzine  for anxiety. - Follow up with psychiatrist for medication management. - Consider therapy options.  Sleep apnea Sleep apnea contributing to somnolence and fatigue. Managed with modafinil . - Continue modafinil  200 mg once a day. - Continue CPAP -  diagnosed in 2016   Morbid Obesity (BMI 39.75) BMI over 35 with comorbidity such as OSA Obesity with BMI of 39.75. Previous weight loss with Wegovy , but insurance issues led to weight regain. Enrolled in weight management program. - Continue with weight management program at [facility]. - Await further guidance from weight management program regarding medication options.  Prediabetes Prediabetes identified on previous blood work. Working with a Data processing manager. - Continue dietary modifications as advised by dietitian. - Follow up with dietitian on Thursday.  Vitamin D  deficiency Chronic vitamin D  deficiency. On vitamin D  supplementation. - Continue vitamin D  supplementation.

## 2024-09-26 ENCOUNTER — Encounter (INDEPENDENT_AMBULATORY_CARE_PROVIDER_SITE_OTHER): Payer: Self-pay | Admitting: Family Medicine

## 2024-09-26 ENCOUNTER — Ambulatory Visit (INDEPENDENT_AMBULATORY_CARE_PROVIDER_SITE_OTHER): Admitting: Family Medicine

## 2024-09-26 VITALS — BP 109/75 | HR 82 | Temp 98.1°F | Ht 63.0 in | Wt 217.0 lb

## 2024-09-26 DIAGNOSIS — I5032 Chronic diastolic (congestive) heart failure: Secondary | ICD-10-CM

## 2024-09-26 DIAGNOSIS — E559 Vitamin D deficiency, unspecified: Secondary | ICD-10-CM | POA: Diagnosis not present

## 2024-09-26 DIAGNOSIS — F39 Unspecified mood [affective] disorder: Secondary | ICD-10-CM | POA: Diagnosis not present

## 2024-09-26 DIAGNOSIS — G4710 Hypersomnia, unspecified: Secondary | ICD-10-CM

## 2024-09-26 DIAGNOSIS — R5383 Other fatigue: Secondary | ICD-10-CM

## 2024-09-26 DIAGNOSIS — G4730 Sleep apnea, unspecified: Secondary | ICD-10-CM

## 2024-09-26 DIAGNOSIS — R0602 Shortness of breath: Secondary | ICD-10-CM

## 2024-09-26 DIAGNOSIS — Z6838 Body mass index (BMI) 38.0-38.9, adult: Secondary | ICD-10-CM

## 2024-09-26 DIAGNOSIS — Z1331 Encounter for screening for depression: Secondary | ICD-10-CM | POA: Diagnosis not present

## 2024-09-26 NOTE — Progress Notes (Signed)
 [name], D.O.  ABFM, ABOM Specializing in Clinical Bariatric Medicine Office located at: [address]  [place], [place]  [address]   Bariatric Medicine Visit  Dear Teresa Mire, MD   Thank you for referring Teresa Gallagher to our clinic today for evaluation.  We performed a consultation to discuss her options for treatment and educate the patient on her disease state.  The following note includes my evaluation and treatment recommendations.   Please do not hesitate to reach out to me directly if you have any further concerns.    Assessment and Plan:   Orders Placed This Encounter  Procedures   Vitamin B12   T4, free   T3   Comprehensive metabolic panel with GFR   Folate   Hemoglobin A1c   Insulin , random   Lipid Panel With LDL/HDL Ratio   VITAMIN D  25 Hydroxy (Vit-D Deficiency, Fractures)   TSH   EKG 12-Lead    Medications Discontinued During This Encounter  Medication Reason   AUVELITY  45-105 MG TBCR Change in therapy    Obtain labs and review at next OV.  FOR THE DISEASE OF OBESITY:  BMI 38.0-38.9,adult Morbid obesity (HCC) Assessment & Plan: Teresa Gallagher is currently in the action stage of change. As such, her goal is to start our weight management plan.  She has agreed to implement: the Category 1 plan with an extra 2 oz of protein   Behavioral Intervention We discussed the following Behavioral Modification Strategies today: increasing lean protein intake to established goals, avoiding skipping meals, work on tracking and journaling calories using tracking application, and decreasing eating out or consumption of processed foods, and making healthy choices when eating convenient foods  Additional resources provided today: Handout on complex carbohydrates and lean sources of protein, Handout on CAT 1 meal plan , Handout on CAT 1-2 lunch options, and Handout on protein equivalents of 2 ounces of meat or seafood   Evidence-based interventions for  health behavior change were utilized today including the discussion of self monitoring techniques, problem-solving barriers and SMART goal setting techniques.    Goal(s) for next OV: weigh veggies and lean protein     Recommended Physical Activity Goals Teresa Gallagher has been advised to work up to 300-450  minutes of moderate intensity aerobic activity a week and strengthening exercises 2-3 times per week for cardiovascular health, weight loss maintenance and preservation of muscle mass.   She has agreed to : maintain current level of activity.    Pharmacotherapy We discussed various medication options to help Teresa Gallagher with her weight loss efforts and we both agreed to: Begin nutritional and behavioral strategies  Pt was previously on Wegovy  but since March her insurance has changed the parameters in order to cover the medication and she discontinued the use. Pt will find out new parameters to see if she can get weight loss medication covered in the future.    ASSOCIATED CONDITIONS ADDRESSED TODAY:  Fatigue Assessment & Plan: Teresa Gallagher does feel that her weight is causing her energy to be lower than it should be. Fatigue may be related to obesity, depression or many other causes. she does not appear to have any red flag symptoms and this appears to most likely be related to her current lifestyle habits and dietary intake.  Labs will be ordered and reviewed with her at their next office visit in two weeks.  Epworth sleepiness scale is 13 and appears not be within normal limits. Teresa Gallagher admits to daytime somnolence and  admits to waking up still tired. Patient has a history of symptoms of Epworth sleepiness scale. Teresa Gallagher generally gets 3 or 4 hours of sleep per night, and states that she has difficulty falling asleep. Snoring is present. Apneic episodes are present.   ECG: Performed and reviewed/ interpreted independently.  Normal sinus rhythm, rate 63 bpm; reassuring without any acute abnormalities,  will continue to monitor for symptoms    Shortness of breath on exertion Assessment & Plan: Teresa Gallagher does feel that she gets out of breath more easily than she used to when she exercises and seems to be worsening over time with weight gain.  This has gotten worse recently. Teresa Gallagher denies shortness of breath at rest or orthopnea. Pt denies  chest pain, dizziness, heart palpitations, or excessive diaphoresis or nausea with activity.  This is not new and is ongoing.  Teresa Gallagher's shortness of breath appears to be obesity related and exercise induced, as they do not appear to have any red flag symptoms/ concerns today.  Also, this condition appears to be related to a state of poor cardiovascular conditioning   Obtain labs today and will be reviewed with her at their next office visit in two weeks.  Indirect Calorimeter completed today to help guide our dietary regimen. It shows a VO2 of 255 and a REE of 1757.  Her calculated basal metabolic rate is 8338 thus her resting energy expenditure is better than expected.  Patient agreed to work on weight loss at this time.  As Ozell progresses through our weight loss program, we will gradually increase exercise as tolerated to treat her current condition.   If Gay follows our recommendations and loses 5-10% of their weight without improvement of her shortness of breath or if at any time, symptoms become more concerning, they agree to urgently follow up with their PCP/ specialist for further consideration/ evaluation.   Teresa Gallagher verbalizes agreement with this plan.     Depression Screen  Mood Disorder - emotional eating  Assessment & Plan Her PHQ-9 score was 17 today. Denies any SI/HI. On Vistril prn for anxiety, Lexapro  10 mg, and Trazadone prn for sleep. Per the medical records pt has seen dr. Vincente of psychiatry in past and was dx with bipolar disorder and when asked about this today she was extremely upset it was in her record. Pt states that she does  see a psychatriast and is currently looking for a new counselor. Pt endorses significant emotional eating. She eats when she is stressed, sad, bored, angry and to help comfort herself and as a reward. Due to pts personal history of bipolar I told pt it was unlikely our psychiatrist would see her for emotional eating. Pt got very upset and insisted she be seen by Dr.Barker. Reached out to Dr.Barker via Epic messenger to see is she is able to see pt for emotional eating. Recommended pt that she discuss with other providers any medical history that is documented in her chart that she feels is inaccurate. Recommended that she obtain a counselor on her own for her mood disorder and pt states that she was in the process of currently finding a counselor but feels the need to interview several and fine the on that meets her needs. Explained to pt that it would be at [facility] discretion and would let her know once Dr.Barker reaches out. Continue to follow up with psychiatrist for med management.  Previous notes states that pt experiences abnormal hunger signals and sometimes feeling full after a few  bites and other times can eat for hours without feeling full.     Chronic diastolic heart failure (HCC) Assessment & Plan On Lasix  20 mg prn. Pt is asx today. Pts last echo was 02/04/21 that showed EF was 60-65% and left ventricular showed grade 1 diastolic dysfunction. Average left ventricular global strain was -13.5 % with a mild elevation with pulmonary artery systolic pressure. Pt has heart catheterization on 09/02/21 that showed direct visualization of LVEF was greater than 65% with no blockages. Normal hemodynamics and no evidence for exercise PAH. Diastolic dysfunction is stable and heart function has remained stable as well. Continue with medications via cardiolody and pcp     Vitamin D  deficiency Assessment & Plan Lab Results  Component Value Date   VD25OH 24 (L) 06/20/2024   VD25OH 24 (L) 08/23/2023    VD25OH 63 08/09/2021   Previously diagnosed with Vit D deficiency. Not currently on any medication or supplements for this. Will obtain labs today to see if supplementation is need.   Sleep apnea with hypersomnolence Assessment & Plan On Provigil  200 mg daily. Pt states that she does not normally use her CPAP machine. Notes reviewed from [facility] from 03/16/23 that shows only 27% compliance rate. Pt was first diagnosed in 2016 but notes do no have data to show her AHI at the time of diagnostic. Pt reports she has horrible sleep wake cycles and does not currently have a sleep specialist. Discussed with pt the importance with compliance of CPAP machine on nightly basis to help with treatment of obesity. Recommended pt to f/u with PCP or sleep medicine physician in near future.       FOLLOW UP:   Follow up in 2 weeks. She was informed of the importance of frequent follow up visits to maximize her success with intensive lifestyle modifications for her multiple health conditions.  CARIANA KARGE is aware that we will review all of her lab results at our next visit.  She is aware that if anything is critical/ life threatening with the results, we will be contacting her via MyChart prior to the office visit to discuss management.     Chief Complaint:   OBESITY Teresa Gallagher (MR# [id number]) is a pleasant 63 y.o. female who presents for evaluation and treatment of obesity and related comorbidities. Current BMI is Body mass index is 38.44 kg/m. Teresa Gallagher has been struggling with her weight for many years and has been unsuccessful in either losing weight, maintaining weight loss, or reaching her healthy weight goal.  Teresa Gallagher is currently in the action stage of change and ready to dedicate time achieving and maintaining a healthier weight. Teresa Gallagher is interested in becoming our patient and working on intensive lifestyle modifications including (but not limited  to) diet and exercise for weight loss.  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  Patient is married to [place] and has children. She lives with her son and her husband when he comes home from [place].  - Pt has had 5 kids.   - pt states that she is in a long distance marriage.   - Pt states that she does not engage in any formal exercise.   - Desire to be 185 lbs in 6 to 12 months.   - Heaviest weight was 218 lbs   - Pt states that she gained weight due to stress eating, decrease in activiy and menopause.   -  Has not tried any specific diets just tried to eat healthy.    - Eats outside home 1 - 2 x week with fast food or takeout  - Craves cheese, butter, popcorn, ice cream, chili, and hamburgers.   - Dislikes like seafood or pork.   - Craves snacks after dinner  - Snacks on nuts, ice cream, popcorn and leftovers.    - Reports only eat 1 to 2 meals daily   - Pt states that her first meal is usually around 12 and she eats anything and eats again around 4 or 5  - Drinks caloric beverages like: regular soda 3 times a week, sweet tea with milk, mixed drinks or margaritas socially approximately 2 times a month.   - Worst food habit is emotional eating and high calorie desserts.   - Pt does not cook and does not keep a lot of foods at home   Subjective:   This is the patient's first visit at [facility].  The patient's NEW PATIENT PACKET that they filled out prior to today's office visit was reviewed at length and information from that paperwork was included within the following office visit note.    Included in the packet: current and past health history, medications, allergies, ROS, gynecologic history (women only), surgical history, family history, social history, weight history, weight loss surgery history (for those that have had weight loss surgery), nutritional evaluation, mood and food questionnaire along with a depression screening  (PHQ9) on all patients, an Epworth questionnaire, sleep habits questionnaire, patient life and health improvement goals questionnaire. These will all be scanned into the patient's chart under the media tab.   Review of Systems: Please refer to new patient packet scanned into media. Pertinent positives were addressed with patient today.  Reviewed by clinician on day of visit: allergies, medications, problem list, medical history, surgical history, family history, social history, and previous encounter notes.  During the visit, I independently reviewed the patient's EKG, bioimpedance scale results, and indirect calorimeter results. I used this information to tailor a meal plan for the patient that will help Teresa Gallagher to lose weight and will improve her obesity-related conditions going forward.  I performed a medically necessary appropriate examination and/or evaluation. I discussed the assessment and treatment plan with the patient. The patient was provided an opportunity to ask questions and all were answered. The patient agreed with the plan and demonstrated an understanding of the instructions. Labs were ordered today (unless patient declined them) and will be reviewed with the patient at our next visit unless more critical results need to be addressed immediately. Clinical information was updated and documented in the EMR.    Objective:   PHYSICAL EXAM: Blood pressure 109/75, pulse 82, temperature 98.1 F (36.7 C), height 5' 3 (1.6 m), weight 217 lb (98.4 kg), SpO2 99%. Body mass index is 38.44 kg/m.  General: Well Developed, well nourished, and in no acute distress.  HEENT: Normocephalic, atraumatic; EOMI, sclerae are anicteric. Skin: Warm and dry, good turgor Chest:  Normal excursion, shape, no gross ABN Respiratory: No conversational dyspnea; speaking in full sentences NeuroM-Sk:  Normal gross ROM * 4 extremities  Psych: A and O *3, insight adequate, mood- full   Anthropometric  Measurements Height: 5' 3 (1.6 m) Weight: 217 lb (98.4 kg) BMI (Calculated): 38.45 Starting Weight: 217 lb Peak Weight: 217 lb Waist Measurement : 39 inches   Body Composition  Body Fat %: 45.2 % Fat Mass (lbs): 98.4 lbs  Muscle Mass (lbs): 113.2 lbs Total Body Water (lbs): 82.4 lbs Visceral Fat Rating : 14   Other Clinical Data RMR: [id number] Fasting: yes Labs: yes Today's Visit #: 1 Starting Date: 09/26/24 Comments: 1    DIAGNOSTIC DATA REVIEWED:  BMET    Component Value Date/Time   NA 138 06/20/2024 1415   NA 142 12/11/2015 1137   K 4.5 06/20/2024 1415   CL 102 06/20/2024 1415   CO2 30 06/20/2024 1415   GLUCOSE 76 06/20/2024 1415   BUN 15 06/20/2024 1415   BUN 13 12/11/2015 1137   CREATININE 0.91 06/20/2024 1415   CALCIUM 9.4 06/20/2024 1415   GFRNONAA >60 04/20/2023 1832   GFRNONAA 62 08/05/2020 1152   GFRAA 72 08/05/2020 1152   Lab Results  Component Value Date   HGBA1C 5.7 (H) 06/20/2024   HGBA1C 5.7 (H) 12/11/2015   No results found for: INSULIN  Lab Results  Component Value Date   TSH 1.32 06/20/2024   CBC    Component Value Date/Time   WBC 7.3 06/20/2024 1415   RBC 4.67 06/20/2024 1415   HGB 13.8 06/20/2024 1415   HGB 14.3 12/11/2015 1137   HCT 41.8 06/20/2024 1415   HCT 41.0 12/11/2015 1137   PLT 213 06/20/2024 1415   PLT 211 12/11/2015 1137   MCV 89.5 06/20/2024 1415   MCV 83 12/11/2015 1137   MCH 29.6 06/20/2024 1415   MCHC 33.0 06/20/2024 1415   RDW 14.2 06/20/2024 1415   RDW 14.8 12/11/2015 1137   Iron Studies No results found for: IRON, TIBC, FERRITIN, IRONPCTSAT Lipid Panel     Component Value Date/Time   CHOL 217 (H) 06/20/2024 1415   CHOL 190 12/11/2015 1137   TRIG 102 06/20/2024 1415   HDL 72 06/20/2024 1415   HDL 57 12/11/2015 1137   CHOLHDL 3.0 06/20/2024 1415   VLDL 17 06/20/2017 1122   LDLCALC 125 (H) 06/20/2024 1415   Hepatic Function Panel     Component Value Date/Time   PROT 6.8 06/20/2024 1415    PROT 6.6 10/11/2022 1449   ALBUMIN 4.0 04/20/2023 1832   ALBUMIN 4.4 10/11/2022 1449   AST 19 06/20/2024 1415   ALT 14 06/20/2024 1415   ALKPHOS 64 04/20/2023 1832   BILITOT 0.6 06/20/2024 1415   BILITOT 0.3 10/11/2022 1449   BILIDIR <0.10 10/11/2022 1449      Component Value Date/Time   TSH 1.32 06/20/2024 1415   Nutritional Lab Results  Component Value Date   VD25OH 24 (L) 06/20/2024   VD25OH 24 (L) 08/23/2023   VD25OH 63 08/09/2021    Attestation Statements:   LILLETTE Sonny Laroche, acting as a Stage manager for Barnie Jenkins, DO., have compiled all relevant documentation for today's office visit on behalf of Barnie Jenkins, DO, while in the presence of [employer], DO.  I have spent 72 minutes in the care of the patient today including: 47 minutes face-to-face assessing and reviewing listed medical problems above as outlined in office visit note, providing nutritional and behavioral counseling as outlined in obesity care plan, independently interpreting results and goals of care, see listed medical problems, and discussing biometric information and progress. 25 minutes was spent on review of chart/new patient packet and additional post-visit documentation.  I have reviewed the above documentation for accuracy and completeness, and I agree with the above. Barnie JINNY Jenkins, D.O.  The 21st Century Cures Act was signed into law in 2016 which includes the topic of electronic health records.  This  provides immediate access to information in MyChart.  This includes consultation notes, operative notes, office notes, lab results and pathology reports.  If you have any questions about what you read please let us  know at your next visit so we can discuss your concerns and take corrective action if need be.  We are right here with you.

## 2024-09-27 LAB — COMPREHENSIVE METABOLIC PANEL WITH GFR
ALT: 19 IU/L (ref 0–32)
AST: 19 IU/L (ref 0–40)
Albumin: 4.5 g/dL (ref 3.9–4.9)
Alkaline Phosphatase: 87 IU/L (ref 49–135)
BUN/Creatinine Ratio: 15 (ref 12–28)
BUN: 13 mg/dL (ref 8–27)
Bilirubin Total: 0.6 mg/dL (ref 0.0–1.2)
CO2: 22 mmol/L (ref 20–29)
Calcium: 9.8 mg/dL (ref 8.7–10.3)
Chloride: 101 mmol/L (ref 96–106)
Creatinine, Ser: 0.88 mg/dL (ref 0.57–1.00)
Globulin, Total: 2.5 g/dL (ref 1.5–4.5)
Glucose: 97 mg/dL (ref 70–99)
Potassium: 4.7 mmol/L (ref 3.5–5.2)
Sodium: 139 mmol/L (ref 134–144)
Total Protein: 7 g/dL (ref 6.0–8.5)
eGFR: 73 mL/min/1.73 (ref >59)

## 2024-09-27 LAB — LIPID PANEL WITH LDL/HDL RATIO
Cholesterol, Total: 211 mg/dL — ABNORMAL HIGH (ref 100–199)
HDL: 63 mg/dL (ref >39)
LDL Chol Calc (NIH): 130 mg/dL — ABNORMAL HIGH (ref 0–99)
LDL/HDL Ratio: 2.1 ratio (ref 0.0–3.2)
Triglycerides: 99 mg/dL (ref 0–149)
VLDL Cholesterol Cal: 18 mg/dL (ref 5–40)

## 2024-09-27 LAB — VITAMIN D 25 HYDROXY (VIT D DEFICIENCY, FRACTURES): Vit D, 25-Hydroxy: 19.3 ng/mL — ABNORMAL LOW (ref 30.0–100.0)

## 2024-09-27 LAB — VITAMIN B12: Vitamin B-12: 1340 pg/mL — ABNORMAL HIGH (ref 232–1245)

## 2024-09-27 LAB — TSH: TSH: 1.11 u[IU]/mL (ref 0.450–4.500)

## 2024-09-27 LAB — HEMOGLOBIN A1C
Est. average glucose Bld gHb Est-mCnc: 111 mg/dL
Hgb A1c MFr Bld: 5.5 % (ref 4.8–5.6)

## 2024-09-27 LAB — INSULIN, RANDOM: INSULIN: 17.4 u[IU]/mL (ref 2.6–24.9)

## 2024-09-27 LAB — T3: T3, Total: 135 ng/dL (ref 71–180)

## 2024-09-27 LAB — T4, FREE: Free T4: 1.11 ng/dL (ref 0.82–1.77)

## 2024-09-27 LAB — FOLATE: Folate: 14.8 ng/mL (ref >3.0)

## 2024-09-30 ENCOUNTER — Telehealth: Payer: Self-pay | Admitting: Pharmacy Technician

## 2024-09-30 ENCOUNTER — Other Ambulatory Visit (HOSPITAL_COMMUNITY): Payer: Self-pay

## 2024-09-30 NOTE — Telephone Encounter (Signed)
 Pharmacy Patient Advocate Encounter  Received notification from [employer] that Prior Authorization for Modafinil  200MG  tablets has been APPROVED from 09/30/24 to 03/31/25. Ran test claim, Copay is $0.00. This test claim was processed through [facility]- copay amounts may vary at other pharmacies due to pharmacy/plan contracts, or as the patient moves through the different stages of their insurance plan.   PA #/Case ID/Reference #: [id number]

## 2024-09-30 NOTE — Telephone Encounter (Signed)
 Pharmacy Patient Advocate Encounter   Received notification from CoverMyMeds that prior authorization for Modafinil  200MG  tablets is required/requested.   Insurance verification completed.   The patient is insured through [facility].   Per test claim: PA required; PA submitted to above mentioned insurance via Latent Key/confirmation #/[id number] Status is pending

## 2024-10-08 ENCOUNTER — Ambulatory Visit (INDEPENDENT_AMBULATORY_CARE_PROVIDER_SITE_OTHER): Admitting: Family Medicine

## 2024-10-14 ENCOUNTER — Ambulatory Visit (INDEPENDENT_AMBULATORY_CARE_PROVIDER_SITE_OTHER): Admitting: Family Medicine

## 2024-10-14 ENCOUNTER — Encounter (INDEPENDENT_AMBULATORY_CARE_PROVIDER_SITE_OTHER): Payer: Self-pay | Admitting: Family Medicine

## 2024-10-14 VITALS — BP 104/70 | HR 91 | Temp 98.7°F | Ht 63.0 in | Wt 211.0 lb

## 2024-10-14 DIAGNOSIS — G4730 Sleep apnea, unspecified: Secondary | ICD-10-CM | POA: Diagnosis not present

## 2024-10-14 DIAGNOSIS — E7841 Elevated Lipoprotein(a): Secondary | ICD-10-CM

## 2024-10-14 DIAGNOSIS — G4710 Hypersomnia, unspecified: Secondary | ICD-10-CM | POA: Diagnosis not present

## 2024-10-14 DIAGNOSIS — R7303 Prediabetes: Secondary | ICD-10-CM | POA: Diagnosis not present

## 2024-10-14 DIAGNOSIS — E559 Vitamin D deficiency, unspecified: Secondary | ICD-10-CM | POA: Diagnosis not present

## 2024-10-14 DIAGNOSIS — Z6838 Body mass index (BMI) 38.0-38.9, adult: Secondary | ICD-10-CM

## 2024-10-14 DIAGNOSIS — Z6837 Body mass index (BMI) 37.0-37.9, adult: Secondary | ICD-10-CM

## 2024-10-14 NOTE — Progress Notes (Signed)
 Teresa Gallagher, D.O.  ABFM, ABOM Clinical Bariatric Medicine Physician  Office located at: [address] [place], [place]  [address]    FOR THE CHRONIC DISEASE OF OBESITY:  Chief complaint: Obesity Teresa Gallagher is here to discuss her progress with her obesity treatment plan.   History of present illness / Interval history:  Teresa Gallagher is here today for her first follow-up office visit since starting the program with us . Patient is off to a good start and has lost 6 lbs  since LOV on 09/26/2024  Reports it was challenging to eat enough.    09/26/24 08:00 10/14/24 14:00   Body Fat % 45.2 % 41.8 %  Muscle Mass (lbs) 113.2 lbs 117 lbs  Fat Mass (lbs) 98.4 lbs 88.6 lbs  Total Body Water (lbs) 82.4 lbs 76.2 lbs  Visceral Fat Rating  14 13  Counseling done today with patient on how various foods will affect these numbers and how to maximize fat loss success   Total lbs lost to date: -6 lbs Total Fat Mass in lbs lost to date: -5 Total weight loss percentage to date: -2.76 %   Nutrition Therapy She is on the Category 1 plan with an extra 2 oz of protein   and states she is following her eating plan approximately 75 % of the time.   - Tracking Calories/Macros: no -    - Eating More Whole Foods: yes  - Adequate Protein Intake: yes  - Adequate Water Intake: yes  - Skipping Meals: yes  - Sleeping 7-9 Hours/ Night: no -    Shanitha is currently in the action stage of change. As such, her goal is to continue weight management plan.  She has agreed to: continue current plan   Physical Activity TOYA PALACIOS is walking, 30 minutes, 3 days per wk currently  Monita has been advised to work up to 300-450 minutes of moderate intensity aerobic activity a week and strengthening exercises 2-3 times per week for cardiovascular health, weight loss maintenance and preservation of muscle mass.  She has agreed to : Think about enjoyable ways to increase daily physical  activity and overcoming barriers to exercise, Increase physical activity in their day and reduce sedentary time (increase NEAT)., Increase volume of physical activity to a goal of 240 minutes a week, and Combine aerobic and strengthening exercises for efficiency and improved cardiometabolic health.   Behavioral Modifications Evidence-based interventions for health behavior change were utilized today including the discussion of  1) self monitoring techniques:  none 2) problem-solving barriers:  none 3) self care:  none 4) SMART goals for next OV:  Measure foods and get adequate caloric and protein intake.  Regarding patient's less desirable eating habits and patterns, we employed the technique of small changes.   We discussed the following today: work on meal planning and preparation, work on tracking and journaling calories using tracking application, and identifying sources and decreasing liquid calories, smaller meals throughout the day Additional resources provided today: Handout on Pre-Diabetes education  and Handout on insulin  resistance education   Medical Interventions/ Pharmacotherapy Previous Bariatric surgery: no Pharmacotherapy for weight loss: She is not currently taking medications  for medical weight loss.     We discussed various medication options to help [name] with her weight loss efforts and we both agreed to : Continue with current nutritional and behavioral strategies   OBESITY RELATED CONDITIONS ADDRESSED TODAY: Extensive review of labs performed today that were drawn at the  new patient office visit on 09/26/2024.   BMI 38.0-38.9,adult start 38.45 Morbid obesity (HCC) current 37.39    Prediabetes- new onset Assessment & Plan  Lab Results  Component Value Date   HGBA1C 5.5 09/26/2024   HGBA1C 5.7 (H) 06/20/2024   HGBA1C 5.6 11/29/2023   INSULIN  17.4 09/26/2024   Lab Results  Component Value Date   CREATININE 0.88 09/26/2024   BUN 13 09/26/2024   NA 139  09/26/2024   K 4.7 09/26/2024   CL 101 09/26/2024   CO2 22 09/26/2024      Component Value Date/Time   PROT 7.0 09/26/2024 1059   ALBUMIN 4.5 09/26/2024 1059   AST 19 09/26/2024 1059   ALT 19 09/26/2024 1059   ALKPHOS 87 09/26/2024 1059   BILITOT 0.6 09/26/2024 1059   BILIDIR <0.10 10/11/2022 1449     Managed by dietary and lifestyle intervention. No FHx of DM. Pt reports having late-night cravings despite not feeling hungry, and states that it could likely be emotional eating. A1c has improved from 5.7 on 06/20/2024 to 5.5 on 09/26/2024.   Insulin  is at 17.4, optimal <5. Cont to reduce adipose tissue and cut back simple carbs. Educated pt that as we reduce adipose tissue, insulin  will decrease. Adequate protein intake will help control insulin  levels.   Explained role of simple carbs and insulin  levels on hunger and cravings Continue to decrease simple carbs/ sugars; increase fiber and proteins -> follow her meal plan. Santita will continue to work on weight loss, exercise, via their meal plan we devised to help decrease the risk of progressing to diabetes. We will recheck A1c and fasting insulin  level in approximately 3 months from last check, or as deemed appropriate  Kidney function and liver function is well controlled and stable. Increase water intake and cont prudent nutritional plan.      Elevated lipoprotein(a) Assessment & Plan  Lab Results  Component Value Date   CHOL 211 (H) 09/26/2024   HDL 63 09/26/2024   LDLCALC 130 (H) 09/26/2024   TRIG 99 09/26/2024   CHOLHDL 3.0 06/20/2024   The 10-year ASCVD risk score (Arnett DK, et al., 2019) is: 4.4%   Reviewed labs with pt from 09/26/2024. Cholesterol is elevated at 211; optimal 100-199. HDL and Trig are WNL. LDL is elevated; optimal< 899. Current ASCVD score is 4.4%, if ASCVD score is exceeds 5%, its likely medication will be required. Encouraged pt to follow up with PCP to determine if cholesterol medication is needed.     Plan:  PARA COSSEY agrees to continue with treatment plan of a heart-heathy, low cholesterol meal plan - I stressed the importance that patient continue with our prudent nutritional plan that is low in saturated and trans fats, and low in fatty carbs to improve these numbers.  - We recommend: aerobic activity with eventual goal of a minimum of 300-450 min wk plus 2 days/ week of resistance or strength training.   - We will continue routine screening as patient continues to achieve health goals along their weight loss journey    Vitamin D  deficiency Assessment & Plan  Lab Results  Component Value Date   VD25OH 19.3 (L) 09/26/2024   VD25OH 24 (L) 06/20/2024   VD25OH 24 (L) 08/23/2023  Reviewed labs with pt, VIT D levels are not at goal at 19.3; optimal >50. On 10/23 neurology PA started pt on 50 K of Vit D weekly due to the 19.3 Vit D level. Pt picked up the prescription  and started the first dose. Ideal vitamin D  levels reviewed with patient; optimal is between 50-70.    -I discussed the importance of vitamin D  to the patient's health and well-being as well as to their ability to lose weight.  -Informed patient this may be a lifelong thing, and she was encouraged to continue to take the medicine until told otherwise.    - Weight loss will likely improve availability of vitamin D , thus encouraged Jadeyn to continue with meal plan and their weight loss efforts to further improve this condition.   - Will monitor levels regularly. Will recheck levels in 3-4 months.     Sleep apnea with hypersomnolence Assessment & Plan  On Provigil  200 mg daily. Reports about a 30-40% adherence with CPAP, but is working on improving compliance. Pt reports sleeping approximately 5 hours per night, more than usual. Has implemented sleep hygiene strategies, including turning off electronics and lights earlier. Will implement the calm app for further sleep support. Cont to follow up with cardiology as  needed and PCP as needed. Encouraged to aim for 7-9 hours of sleep per night.    FOLLOW UP:   Return in about 3 weeks (around 11/04/2024). She was informed of the importance of frequent follow up visits to maximize her success with intensive lifestyle modifications for her multiple health conditions.   Weight Summary and Biometrics   Weight Lost Since Last Visit: 6lb  Weight Gained Since Last Visit: 0lb   Vitals Temp: 98.7 F (37.1 C) BP: 104/70 Pulse Rate: 91 SpO2: 97 %   Anthropometric Measurements Height: 5' 3 (1.6 m) Weight: 211 lb (95.7 kg) BMI (Calculated): 37.39 Weight at Last Visit: 217lb Weight Lost Since Last Visit: 6lb Weight Gained Since Last Visit: 0lb Starting Weight: 217lb Total Weight Loss (lbs): 6 lb (2.722 kg) Peak Weight: 217lb   Body Composition  Body Fat %: 41.8 % Fat Mass (lbs): 88.6 lbs Muscle Mass (lbs): 117 lbs Total Body Water (lbs): 76.2 lbs Visceral Fat Rating : 13   Other Clinical Data Fasting: no Labs: no Today's Visit #: 2 Starting Date: 09/26/24     Objective:  Review of Systems:  Pertinent positives were addressed with patient today.   Reviewed by clinician on day of visit: allergies, medications, problem list, medical history, surgical history, family history, social history, and previous encounter notes.  PHYSICAL EXAM: Blood pressure 104/70, pulse 91, temperature 98.7 F (37.1 C), height 5' 3 (1.6 m), weight 211 lb (95.7 kg), SpO2 97%. Body mass index is 37.38 kg/m. General: she is overweight, cooperative and in no acute distress.   HEENT: EOMI, sclerae are anicteric. Lungs: Normal breathing effort, no conversational dyspnea. M-Sk:  Normal gross ROM * 4 extremities  PSYCH: Has normal mood, affect and thought process. Neurologic: No gross sensory or motor deficits. Well developed, A and O * 3  DIAGNOSTIC DATA REVIEWED:  BMET    Component Value Date/Time   NA 139 09/26/2024 1059   K 4.7 09/26/2024 1059    CL 101 09/26/2024 1059   CO2 22 09/26/2024 1059   GLUCOSE 97 09/26/2024 1059   GLUCOSE 76 06/20/2024 1415   BUN 13 09/26/2024 1059   CREATININE 0.88 09/26/2024 1059   CREATININE 0.91 06/20/2024 1415   CALCIUM 9.8 09/26/2024 1059   GFRNONAA >60 04/20/2023 1832   GFRNONAA 62 08/05/2020 1152   GFRAA 72 08/05/2020 1152   Lab Results  Component Value Date   HGBA1C 5.5 09/26/2024   HGBA1C 5.7 (H) 12/11/2015  Lab Results  Component Value Date   INSULIN  17.4 09/26/2024   Lab Results  Component Value Date   TSH 1.110 09/26/2024   CBC    Component Value Date/Time   WBC 7.3 06/20/2024 1415   RBC 4.67 06/20/2024 1415   HGB 13.8 06/20/2024 1415   HGB 14.3 12/11/2015 1137   HCT 41.8 06/20/2024 1415   HCT 41.0 12/11/2015 1137   PLT 213 06/20/2024 1415   PLT 211 12/11/2015 1137   MCV 89.5 06/20/2024 1415   MCV 83 12/11/2015 1137   MCH 29.6 06/20/2024 1415   MCHC 33.0 06/20/2024 1415   RDW 14.2 06/20/2024 1415   RDW 14.8 12/11/2015 1137   Iron Studies No results found for: IRON, TIBC, FERRITIN, IRONPCTSAT Lipid Panel     Component Value Date/Time   CHOL 211 (H) 09/26/2024 1059   TRIG 99 09/26/2024 1059   HDL 63 09/26/2024 1059   CHOLHDL 3.0 06/20/2024 1415   VLDL 17 06/20/2017 1122   LDLCALC 130 (H) 09/26/2024 1059   LDLCALC 125 (H) 06/20/2024 1415   Hepatic Function Panel     Component Value Date/Time   PROT 7.0 09/26/2024 1059   ALBUMIN 4.5 09/26/2024 1059   AST 19 09/26/2024 1059   ALT 19 09/26/2024 1059   ALKPHOS 87 09/26/2024 1059   BILITOT 0.6 09/26/2024 1059   BILIDIR <0.10 10/11/2022 1449      Component Value Date/Time   TSH 1.110 09/26/2024 1059   Nutritional Lab Results  Component Value Date   VD25OH 19.3 (L) 09/26/2024   VD25OH 24 (L) 06/20/2024   VD25OH 24 (L) 08/23/2023     Attestations:   LILLETTE Feliciano Mingle, acting as a stage manager for Teresa Jenkins, DO., have compiled all relevant documentation for today's office visit on  behalf of Teresa Jenkins, DO, while in the presence of [employer], DO.  I have spent 60 minutes in the care of the patient today.  49 minutes was spent on face-to-face counseling and reviewing listed medical problems above as outlined in office visit note, providing nutritional and behavioral counseling as outlined in obesity care plan, independently interpreting results and goals of care, see listed medical problems, and discussing biometric information and progress. We reviewed her meal plan and discussed how the foods her eating is affecting each one of her labs. Pt educated on why we want her to eat various foods in various amounts and has a better understanding of the nutritional plan because of this. All her questions were answered today.    I have reviewed the above documentation for accuracy and completeness, and I agree with the above. Teresa JINNY Gallagher, D.O.  The 21st Century Cures Act was signed into law in 2016 which includes the topic of electronic health records.  This provides immediate access to information in MyChart.  This includes consultation notes, operative notes, office notes, lab results and pathology reports.  If you have any questions about what you read please let us  know at your next visit so we can discuss your concerns and take corrective action if need be.  We are right here with you.

## 2024-10-14 NOTE — Patient Instructions (Signed)
 The 10-year ASCVD risk score (Arnett DK, et al., 2019) is: 4.4%   Values used to calculate the score:     Age: 63 years     Clincally relevant sex: Female     Is Non-Hispanic African American: Yes     Diabetic: No     Tobacco smoker: No     Systolic Blood Pressure: 104 mmHg     Is BP treated: No     HDL Cholesterol: 63 mg/dL     Total Cholesterol: 211 mg/dL

## 2024-10-18 ENCOUNTER — Ambulatory Visit: Admitting: Physical Therapy

## 2024-10-22 ENCOUNTER — Other Ambulatory Visit: Payer: Self-pay | Admitting: Family Medicine

## 2024-10-22 ENCOUNTER — Other Ambulatory Visit: Payer: Self-pay | Admitting: *Deleted

## 2024-10-22 ENCOUNTER — Inpatient Hospital Stay
Admission: RE | Admit: 2024-10-22 | Discharge: 2024-10-22 | Disposition: A | Discharge status: Home / Self Care | Payer: Self-pay | Source: Ambulatory Visit | Attending: Family Medicine | Admitting: Family Medicine

## 2024-10-22 ENCOUNTER — Inpatient Hospital Stay
Admission: RE | Admit: 2024-10-22 | Discharge: 2024-10-22 | Disposition: A | Payer: Self-pay | Source: Ambulatory Visit | Attending: Family Medicine | Admitting: Family Medicine

## 2024-10-22 DIAGNOSIS — Z1231 Encounter for screening mammogram for malignant neoplasm of breast: Secondary | ICD-10-CM

## 2024-10-22 DIAGNOSIS — Z87898 Personal history of other specified conditions: Secondary | ICD-10-CM

## 2024-10-22 DIAGNOSIS — M79621 Pain in right upper arm: Secondary | ICD-10-CM

## 2024-10-22 DIAGNOSIS — R928 Other abnormal and inconclusive findings on diagnostic imaging of breast: Secondary | ICD-10-CM

## 2024-10-23 ENCOUNTER — Ambulatory Visit (INDEPENDENT_AMBULATORY_CARE_PROVIDER_SITE_OTHER): Admitting: Physician Assistant

## 2024-10-23 ENCOUNTER — Ambulatory Visit: Admitting: Physical Therapy

## 2024-10-28 ENCOUNTER — Ambulatory Visit
Admission: RE | Admit: 2024-10-28 | Discharge: 2024-10-28 | Disposition: A | Source: Ambulatory Visit | Attending: Family Medicine | Admitting: Family Medicine

## 2024-10-28 ENCOUNTER — Ambulatory Visit
Admission: RE | Admit: 2024-10-28 | Discharge: 2024-10-28 | Disposition: A | Discharge status: Home / Self Care | Source: Ambulatory Visit | Attending: Family Medicine | Admitting: Family Medicine

## 2024-10-28 DIAGNOSIS — Z87898 Personal history of other specified conditions: Secondary | ICD-10-CM | POA: Insufficient documentation

## 2024-10-28 DIAGNOSIS — Z1231 Encounter for screening mammogram for malignant neoplasm of breast: Secondary | ICD-10-CM | POA: Insufficient documentation

## 2024-10-28 DIAGNOSIS — M79621 Pain in right upper arm: Secondary | ICD-10-CM | POA: Diagnosis present

## 2024-10-31 ENCOUNTER — Ambulatory Visit: Admitting: Physical Therapy

## 2024-11-05 ENCOUNTER — Ambulatory Visit

## 2024-11-06 ENCOUNTER — Ambulatory Visit (INDEPENDENT_AMBULATORY_CARE_PROVIDER_SITE_OTHER): Payer: Self-pay | Admitting: Physician Assistant

## 2024-11-06 ENCOUNTER — Encounter (INDEPENDENT_AMBULATORY_CARE_PROVIDER_SITE_OTHER): Payer: Self-pay | Admitting: Physician Assistant

## 2024-11-06 VITALS — BP 120/74 | HR 87 | Temp 97.9°F | Ht 63.0 in | Wt 213.0 lb

## 2024-11-06 DIAGNOSIS — Z6837 Body mass index (BMI) 37.0-37.9, adult: Secondary | ICD-10-CM

## 2024-11-06 DIAGNOSIS — R7303 Prediabetes: Secondary | ICD-10-CM

## 2024-11-06 DIAGNOSIS — M797 Fibromyalgia: Secondary | ICD-10-CM

## 2024-11-06 DIAGNOSIS — E7800 Pure hypercholesterolemia, unspecified: Secondary | ICD-10-CM

## 2024-11-06 NOTE — Progress Notes (Signed)
 SUBJECTIVE: Discussed the use of AI scribe software for clinical note transcription with the patient, who gave verbal consent to proceed.  Chief Complaint: Obesity  Interim History: She is up 2 lbs since her last visit.  Down 4 lbs overall.  Najla is here to discuss her progress with her obesity treatment plan. She is on the Category 1 Plan and keeping a food journal and adhering to recommended goals of 1100 calories and 75 grams of protein and states she is following her eating plan approximately 85 % of the time. She states she is exercising walking 30 minutes 5 times per week. BRITTANY OSIER is a 63 year old female with obesity who presents for follow-up of her obesity treatment plan.  She is adhering to a category one obesity treatment plan approximately 85% of the time, which includes an additional two ounces of protein. She has gained two pounds since her last visit.  Her diet consists of more whole foods, but she does not consistently meet the recommended protein intake. She ensures adequate hydration, does not skip meals, sleeps between seven to nine hours per night, and engages in walking for thirty minutes five times per week.  She experiences significant activity and stress, which impacts her focus on the treatment plan. Dietary changes have been made to address her rheumatoid arthritis and high cholesterol, with a focus on anti-inflammatory foods rather than strict calorie counting. She is seeking additional support from a nutritionist and therapist through her insurance.  Her diet includes whole foods, and she avoids fast food and processed meals. She utilizes a food bank that allows her to choose her foods, helping to avoid waste. She cooks at home, stretches meals over several days to manage costs, incorporates anti-inflammatory foods, and explores new recipes and ingredients like chickpeas and quinoa.  She takes several supplements, including osteo Bioflex,  hydroxychloroquine, vitamin D3, K2, and a probiotic.  She actively manages her mental health, seeing a psychiatrist and working with her insurance to find a therapist to address her relationship with food.  Supplements which she is now taking include: OsteoBiflex 2 daily Chromium daily D3K2 once daily Womens probiotics daily  OBJECTIVE: Visit Diagnoses: Problem List Items Addressed This Visit     Fibromyalgia syndrome   Morbid obesity (HCC)   Prediabetes - Primary   Other Visit Diagnoses       Pure hypercholesterolemia         BMI 37.0-37.9, adult Current BMI 37.7          Obesity Obesity with a recent weight gain of two pounds since the last visit. She is on a category one plan with an extra two ounces of protein, adhering to the plan approximately 85% of the time. She is consuming more whole foods but not meeting the recommended protein intake. She is drinking enough water, not skipping meals, and sleeping 7-9 hours per night. She is walking for 30 minutes five times a week. She is exploring anti-inflammatory foods and considering a pescatarian diet to aid in weight loss and manage rheumatoid arthritis. She reports she does cook and we discussed [email or web address] for recipe ideas - Continue current diet plan with focus on anti-inflammatory foods. - Explore pescatarian diet options for protein intake. - Utilize resources like [place] for anti-inflammatory recipes. - Limit avocado intake to a quarter per day due to calorie content. - Incorporate nuts like walnuts for anti-inflammatory benefits. - Schedule follow-up appointments every 2-3 weeks.   Prediabetes Last A1c was 5.5-  improved and at goal. Insulin  17.4- elevated- not at goal.   Medication(s): None Polyphagia:No Lab Results  Component Value Date   HGBA1C 5.5 09/26/2024   HGBA1C 5.7 (H) 06/20/2024   HGBA1C 5.6 11/29/2023   HGBA1C 5.8 (H) 08/23/2023   HGBA1C 5.3 08/09/2021   Lab Results  Component Value Date    INSULIN  17.4 09/26/2024   Plan:  Continue working on nutrition plan to decrease simple carbohydrates, increase lean proteins and exercise to promote weight loss, improve glycemic control and prevent progression to Type 2 diabetes.  Could consider metformin- off label use for prediabetes with insulin  resistance.   Fibromyalgia/? Rheumatoid arthritis Patient reports New diagnosis of RA/inflammatory arthritis with old symptoms that she feels had been present without diagnosis longer term.  She is focusing on anti-inflammatory foods to manage symptoms, especially as weather changes exacerbate the condition. She is using supplements like Bioflex and hydroxychloroquine, which she reports as beneficial. - Continue Bioflex and hydroxychloroquine as prescribed. - Incorporate anti-inflammatory foods into diet. - Explore pescatarian diet options for additional anti-inflammatory benefits. Weight loss should help to decrease symptoms and off load joints.   Hypercholesterolemia She is managing hypercholesterolemia with dietary modifications, including a pescatarian diet that may benefit cholesterol levels.  Last lipids Lab Results  Component Value Date   CHOL 211 (H) 09/26/2024   HDL 63 09/26/2024   LDLCALC 130 (H) 09/26/2024   TRIG 99 09/26/2024   CHOLHDL 3.0 06/20/2024  The 10-year ASCVD risk score (Arnett DK, et al., 2019) is: 6.3%   Values used to calculate the score:     Age: 31 years     Clincally relevant sex: Female     Is Non-Hispanic African American: Yes     Diabetic: No     Tobacco smoker: No     Systolic Blood Pressure: 120 mmHg     Is BP treated: No     HDL Cholesterol: 63 mg/dL     Total Cholesterol: 211 mg/dL LDL not at goal. HDL at goal. Trig- at goal.  Plan:  Continue to work on nutrition plan -decreasing simple carbohydrates, increasing lean proteins, decreasing saturated fats and cholesterol , avoiding trans fats and exercise as able to promote weight loss, improve lipids  and decrease cardiovascular risks.   Vitals Temp: 97.9 F (36.6 C) BP: 120/74 Pulse Rate: 87 SpO2: 97 %   Anthropometric Measurements Height: 5' 3 (1.6 m) Weight: 213 lb (96.6 kg) BMI (Calculated): 37.74 Weight at Last Visit: 211 lb Weight Lost Since Last Visit: 0 Weight Gained Since Last Visit: 2 lb Starting Weight: 217 lb Total Weight Loss (lbs): 4 lb (1.814 kg) Peak Weight: 217 lb   Body Composition  Body Fat %: 43.3 % Fat Mass (lbs): 92.2 lbs Muscle Mass (lbs): 114.6 lbs Total Body Water (lbs): 81.2 lbs Visceral Fat Rating : 14   Other Clinical Data Fasting: No Labs: No Today's Visit #: 3 Starting Date: 09/26/24     ASSESSMENT AND PLAN:  Diet: Jenan is currently in the action stage of change. As such, her goal is to continue with weight loss efforts. She has agreed to [employer].  Exercise: Izora has been instructed to work up to a goal of 150 minutes of combined cardio and strengthening exercise per week and to continue exercising as is for weight loss and overall health benefits.   Behavior Modification:  We discussed the following Behavioral Modification Strategies today: increasing lean protein intake, decreasing simple carbohydrates, increasing vegetables, increase H2O intake,  increase high fiber foods, meal planning and cooking strategies, emotional eating strategies , holiday eating strategies, avoiding temptations, planning for success, and keep a strict food journal. We discussed various medication options to help Gwyndolyn with her weight loss efforts and we both agreed to start a Pescatarian nutrition plan or journal 1100 calories and 75 grams of protein daily.  Return in about 3 weeks (around 11/27/2024).SABRA She was informed of the importance of frequent follow up visits to maximize her success with intensive lifestyle modifications for her multiple health conditions.  Attestation Statements:   Reviewed by clinician on day of visit:  allergies, medications, problem list, medical history, surgical history, family history, social history, and previous encounter notes.   Time spent on visit including pre-visit chart review and post-visit care and charting was 48 minutes.    Rueben Kassim, PA-C

## 2024-11-08 ENCOUNTER — Ambulatory Visit

## 2024-11-11 ENCOUNTER — Ambulatory Visit

## 2024-11-13 ENCOUNTER — Ambulatory Visit
Admission: RE | Admit: 2024-11-13 | Discharge: 2024-11-13 | Disposition: A | Discharge status: Home / Self Care | Source: Ambulatory Visit | Attending: Family Medicine | Admitting: Family Medicine

## 2024-11-13 ENCOUNTER — Ambulatory Visit: Payer: Self-pay | Admitting: Family Medicine

## 2024-11-13 DIAGNOSIS — Z13820 Encounter for screening for osteoporosis: Secondary | ICD-10-CM | POA: Insufficient documentation

## 2024-11-18 ENCOUNTER — Ambulatory Visit: Admitting: Physical Therapy

## 2024-11-22 ENCOUNTER — Ambulatory Visit

## 2024-11-25 ENCOUNTER — Ambulatory Visit

## 2024-11-27 ENCOUNTER — Ambulatory Visit

## 2024-12-02 ENCOUNTER — Ambulatory Visit

## 2024-12-04 ENCOUNTER — Ambulatory Visit

## 2024-12-09 ENCOUNTER — Ambulatory Visit

## 2024-12-11 ENCOUNTER — Ambulatory Visit

## 2024-12-16 ENCOUNTER — Ambulatory Visit

## 2024-12-18 ENCOUNTER — Ambulatory Visit (INDEPENDENT_AMBULATORY_CARE_PROVIDER_SITE_OTHER): Admitting: Physician Assistant

## 2024-12-18 ENCOUNTER — Ambulatory Visit

## 2024-12-19 ENCOUNTER — Other Ambulatory Visit: Payer: Self-pay | Admitting: Family Medicine

## 2024-12-19 DIAGNOSIS — M545 Low back pain, unspecified: Secondary | ICD-10-CM

## 2024-12-20 NOTE — Telephone Encounter (Signed)
 Requested medication (s) are due for refill today - yes  Requested medication (s) are on the active medication list -yes  Future visit scheduled -yes  Last refill: 09/24/24 #100 1RF  Notes to clinic: non delegated Rx  Requested Prescriptions  Pending Prescriptions Disp Refills   tiZANidine  (ZANAFLEX ) 4 MG tablet [Pharmacy Med Name: TIZANIDINE  HCL 4 MG TABLET] 60 tablet 3    Sig: Take 1 tablet (4 mg total) by mouth 2 (two) times daily as needed for muscle spasms.     Not Delegated - Cardiovascular:  Alpha-2 Agonists - tizanidine  Failed - 12/20/2024 11:36 AM      Failed - This refill cannot be delegated      Passed - Valid encounter within last 6 months    Recent Outpatient Visits           2 months ago Chronic diastolic heart failure [facility])   [place] [facility] Glenard Mire, MD   3 months ago Well adult exam   [facility] Glenard Mire, MD   5 months ago Chronic diastolic heart failure [facility])   [place] [facility] Glenard Mire, MD   6 months ago Numbness and tingling in both hands   [facility] Gareth Mliss FALCON, FNP       Future Appointments             In 8 months Sowles, Krichna, MD [facility] [place]               Requested Prescriptions  Pending Prescriptions Disp Refills   tiZANidine  (ZANAFLEX ) 4 MG tablet [Pharmacy Med Name: TIZANIDINE  HCL 4 MG TABLET] 60 tablet 3    Sig: Take 1 tablet (4 mg total) by mouth 2 (two) times daily as needed for muscle spasms.     Not Delegated - Cardiovascular:  Alpha-2 Agonists - tizanidine  Failed - 12/20/2024 11:36 AM      Failed - This refill cannot be delegated      Passed - Valid encounter within last 6 months    Recent Outpatient Visits           2 months ago Chronic diastolic heart failure [facility])   [place] [facility] Glenard Mire, MD   3 months ago Well adult exam    [facility] Glenard Mire, MD   5 months ago Chronic diastolic heart failure [facility])   [facility] Sowles, Krichna, MD   6 months ago Numbness and tingling in both hands   [facility] Gareth Mliss FALCON, FNP       Future Appointments             In 8 months Sowles, Krichna, MD [facility], [place]

## 2024-12-23 ENCOUNTER — Ambulatory Visit

## 2024-12-25 ENCOUNTER — Encounter: Payer: Self-pay | Admitting: Family Medicine

## 2024-12-25 ENCOUNTER — Ambulatory Visit

## 2024-12-25 ENCOUNTER — Telehealth: Admitting: Family Medicine

## 2024-12-25 ENCOUNTER — Other Ambulatory Visit: Payer: Self-pay | Admitting: Family Medicine

## 2024-12-25 DIAGNOSIS — M0600 Rheumatoid arthritis without rheumatoid factor, unspecified site: Secondary | ICD-10-CM

## 2024-12-25 DIAGNOSIS — F331 Major depressive disorder, recurrent, moderate: Secondary | ICD-10-CM

## 2024-12-25 DIAGNOSIS — G4733 Obstructive sleep apnea (adult) (pediatric): Secondary | ICD-10-CM

## 2024-12-25 DIAGNOSIS — M797 Fibromyalgia: Secondary | ICD-10-CM

## 2024-12-25 DIAGNOSIS — G4710 Hypersomnia, unspecified: Secondary | ICD-10-CM

## 2024-12-25 DIAGNOSIS — M545 Low back pain, unspecified: Secondary | ICD-10-CM

## 2024-12-25 DIAGNOSIS — Z6837 Body mass index (BMI) 37.0-37.9, adult: Secondary | ICD-10-CM | POA: Diagnosis not present

## 2024-12-25 DIAGNOSIS — M3500 Sicca syndrome, unspecified: Secondary | ICD-10-CM | POA: Diagnosis not present

## 2024-12-25 DIAGNOSIS — I5032 Chronic diastolic (congestive) heart failure: Secondary | ICD-10-CM | POA: Diagnosis not present

## 2024-12-25 MED ORDER — PREGABALIN 75 MG PO CAPS: 75.0000 mg | ORAL_CAPSULE | Freq: Two times a day (BID) | ORAL | 0 refills | Status: AC

## 2024-12-25 MED ORDER — TIZANIDINE HCL 4 MG PO TABS: 4.0000 mg | ORAL_TABLET | Freq: Two times a day (BID) | ORAL | 1 refills | Status: AC | PRN

## 2024-12-25 MED ORDER — ZEPBOUND 2.5 MG/0.5ML ~~LOC~~ SOAJ: 2.5000 mg | SUBCUTANEOUS | 0 refills | Status: AC

## 2024-12-25 MED ORDER — MODAFINIL 200 MG PO TABS: 200.0000 mg | ORAL_TABLET | Freq: Every day | ORAL | 0 refills | Status: AC

## 2024-12-25 NOTE — Progress Notes (Signed)
 "  Name: Teresa Gallagher   MRN: [id number]    DOB: 1961-08-18   Date:12/25/2024       Progress Note  Subjective  Chief Complaint  Chief Complaint  Patient presents with   Medical Management of Chronic Issues    I connected with  [name]  on 12/25/2024 at  2:20 PM EST by a video enabled telemedicine application and verified that I am speaking with the correct person using two identifiers.  I discussed the limitations of evaluation and management by telemedicine and the availability of in person appointments. The patient expressed understanding and agreed to proceed with the virtual visit  Staff also discussed with the patient that there may be a patient responsible charge related to this service. Patient Location: at home Provider Location: [facility] Additional Individuals present: alone   Discussed the use of AI scribe software for clinical note transcription with the patient, who gave verbal consent to proceed.  History of Present Illness Teresa Gallagher is a 64 year old female with morbid obesity and obstructive sleep apnea who presents for a medication follow-up and weight management consultation.  She is dissatisfied with her current weight loss program at [facility] due to lack of support, receiving only handouts and being advised to follow an 1100 calorie diet. Her last visit to the program was on November 06, 2024. She is interested in exploring medication options for weight loss, particularly Zepbound , due to her obstructive sleep apnea, which was diagnosed in a sleep study conducted in 2021. She uses CPAP therapy most nights with a pressure setting of 15 cm of water. Her current weight is 219 lbs, with a BMI of 37.7.  She has a history of rheumatoid arthritis with negative rheumatoid factor, managed with methotrexate 12.5 mg weekly, hydroxychloroquine 200 mg twice daily, and folic acid. She also has secondary Sjogren's syndrome, experiencing severe dry mouth managed with biotin products,  and vaginal dryness managed with KY gel.  She experiences chronic diastolic heart failure, last evaluated with an echocardiogram in 2022 and a cardiac catheterization. She experiences shortness of breath with activity and uses multiple pillows at night to avoid lying flat. She has not seen her cardiologist recently.  She has a history of depression, previously severe enough to stop working. Her PHQ-9 score was 17 in October. She is under psychiatric care and is taking escitalopram , trazodone  for sleep, and hydroxyzine  for anxiety. She also takes Lyrica  75 mg twice daily for fibromyalgia, which helps with mood and pain.  She has thyroid  nodules that were biopsied and found to be benign. She also has a history of vitamin D  deficiency, previously managed with a weekly prescription and a daily dose of 5000 IU.  She reports a recent diagnosis of carpal tunnel syndrome in the left hand, confirmed by a nerve conduction study. She is awaiting further treatment plans, including possible injections and therapy.      Patient Active Problem List   Diagnosis Date Noted   Prediabetes 10/14/2024   Non-toxic multinodular goiter 11/29/2023   Adenomatous polyp of descending colon 11/08/2022   Rectal polyp 11/08/2022   Chronic diastolic heart failure (HCC) 06/10/2022   Breast lump on right side at 10 o'clock position 06/10/2022   Circadian rhythm sleep disorder, shift work type 03/23/2021   Attention and concentration deficit 01/20/2020   Alcohol use disorder, mild, in early remission 10/29/2019   Old tear of meniscus of right knee 03/20/2019   SOB (shortness of breath) 08/29/2017  Episodic tension-type headache, not intractable 12/20/2016   History of blood transfusion 07/30/2015   GAD (generalized anxiety disorder) 07/30/2015   Chronic constipation 07/13/2015   Insomnia 07/13/2015   MDD (major depressive disorder), recurrent episode, mild (HCC) 07/13/2015   Fibromyalgia syndrome 07/13/2015   Herpes  simplex type 2 infection 07/13/2015   Morbid obesity (HCC) 07/13/2015   Sleep apnea with hypersomnolence 07/13/2015   Vitamin D  deficiency 07/13/2015    Past Surgical History:  Procedure Laterality Date   ABDOMINAL HYSTERECTOMY     BUNIONECTOMY Right 11/07/2014   Dr. Primus [facility]    CARDIAC CATHETERIZATION     CARPAL TUNNEL RELEASE Left 12/19/2010   CESAREAN SECTION     COLONOSCOPY WITH PROPOFOL  N/A 11/08/2022   Procedure: COLONOSCOPY WITH PROPOFOL ;  Surgeon: Unk Corinn Skiff, MD;  Location: [facility] ENDOSCOPY;  Service: Gastroenterology;  Laterality: N/A;   LAPAROSCOPIC HYSTERECTOMY     followed by 3 cuff repairs   LIPOMA EXCISION  12/19/2010   REFRACTIVE SURGERY Bilateral    RIGHT/LEFT HEART CATH AND CORONARY ANGIOGRAPHY N/A 09/02/2021   Procedure: RIGHT/LEFT HEART CATH AND CORONARY ANGIOGRAPHY;  Surgeon: Cherrie Toribio SAUNDERS, MD;  Location: [facility] INVASIVE CV LAB;  Service: Cardiovascular;  Laterality: N/A;   TONSILLECTOMY      Family History  Problem Relation Age of Onset   Heart attack Father    Alcohol abuse Father    Sleep apnea Father    Alcoholism Father    COPD Sister        end stages   Bipolar disorder Brother    Depression Brother    Anxiety disorder Brother    Arthritis Brother    Obesity Brother    Bipolar disorder Brother    Depression Brother    Alcohol abuse Brother    Intellectual disability Son    Breast cancer Maternal Aunt    Breast cancer Paternal Aunt    Heart attack Paternal Uncle     Social History   Socioeconomic History   Marital status: Single    Spouse name: larry   Number of children: 3   Years of education: Not on file   Highest education level: Bachelor's degree (e.g., BA, AB, BS)  Occupational History   Not on file  Tobacco Use   Smoking status: Never   Smokeless tobacco: Never  Vaping Use   Vaping status: Never Used  Substance and Sexual Activity   Alcohol use: Not Currently   Drug use: No   Sexual activity:  Yes  Other Topics Concern   Not on file  Social History Narrative   She has been on disability since end of 2021   She has a grown son that has Down's syndrome that lives with her       Social Drivers of Health   Tobacco Use: Low Risk (12/25/2024)   Patient History    Smoking Tobacco Use: Never    Smokeless Tobacco Use: Never    Passive Exposure: Not on file  Financial Resource Strain: High Risk (10/10/2024)   [facility]   Overall Financial Resource Strain (CARDIA)    Difficulty of Paying Living Expenses: Hard  Food Insecurity: Food Insecurity Present (10/10/2024)   [facility]   Epic    Within the past 12 months, you worried that your food would run out before you got the money to buy more.: Sometimes true    Within the past 12 months, the food  you bought just didn't last and you didn't have money to get more.: Sometimes true  Transportation Needs: No Transportation Needs (10/10/2024)   Received from [facility] - Transportation    In the past 12 months, has lack of transportation kept you from medical appointments or from getting medications?: No    Lack of Transportation (Non-Medical): No  Physical Activity: Insufficiently Active (06/23/2024)   Exercise Vital Sign    Days of Exercise per Week: 1 day    Minutes of Exercise per Session: 20 min  Stress: Stress Concern Present (06/23/2024)   [employer] of [employer]ational [employer] - Occupational Stress Questionnaire    Feeling of Stress: Rather much  Social Connections: Socially Isolated (06/23/2024)   Social Connection and Isolation Panel    Frequency of Communication with Friends and Family: Twice a week    Frequency of Social Gatherings with Friends and Family: Never    Attends Religious Services: Never    Database Administrator or Organizations: No    Attends Engineer, Structural: Not on file    Marital Status: Separated   Intimate Partner Violence: Not At Risk (08/23/2023)   Humiliation, Afraid, Rape, and Kick questionnaire    Fear of Current or Ex-Partner: No    Emotionally Abused: No    Physically Abused: No    Sexually Abused: No  Depression (PHQ2-9): Medium Risk (12/25/2024)   Depression (PHQ2-9)    PHQ-2 Score: 5  Alcohol Screen: Low Risk (06/23/2024)   Alcohol Screen    Last Alcohol Screening Score (AUDIT): 2  Housing: Low Risk  (11/27/2024)   Received from [facility]   Epic    In the last 12 months, was there a time when you were not able to pay the mortgage or rent on time?: No    In the past 12 months, how many times have you moved where you were living?: 0    At any time in the past 12 months, were you homeless or living in a shelter (including now)?: No  Utilities: Not At Risk (10/10/2024)   Received from [facility]   Epic    In the past 12 months has the electric, gas, oil, or water company threatened to shut off services in your home?: No  Health Literacy: Adequate Health Literacy (08/28/2024)   B1300 Health Literacy    Frequency of need for help with medical instructions: Rarely    Current Medications[1]  Allergies[2]  I personally reviewed active problem list, medication list, allergies, family history with the patient/caregiver today.   ROS  Ten systems reviewed and is negative except as mentioned in HPI    Objective  Virtual encounter, vitals not obtained.   Physical Exam   Awake, alert and oriented   PHQ2/9:    12/25/2024   11:43 AM 09/26/2024    9:31 AM 09/24/2024    9:55 AM 08/28/2024    2:59 PM 06/24/2024    9:46 AM  Depression screen PHQ 2/9  Decreased Interest 1 3 2 2 2   Down, Depressed, Hopeless 1 2 2 2 2   PHQ - 2 Score 2 5 4 4 4   Altered sleeping 1 3 2 2 2   Tired, decreased energy 1 3 2 2 2   Change in appetite 0 2 2 2 2   Feeling bad or failure about yourself  0 1 2 2 2   Trouble concentrating 1 2 2 2 2   Moving slowly or  fidgety/restless  0 1 0 0 0  Suicidal thoughts 0 0 0 0 0  PHQ-9 Score 5 17  14  14  14    Difficult doing work/chores Somewhat difficult  Very difficult Very difficult Very difficult     Data saved with a previous flowsheet row definition   PHQ-2/9 Result is positive.    Fall Risk:    12/25/2024   11:43 AM 09/24/2024    9:52 AM 08/28/2024    2:59 PM 06/24/2024    9:46 AM 11/29/2023    1:58 PM  Fall Risk   Falls in the past year? 0 0 0 0 0  Number falls in past yr: 0 0 0 0 0  Injury with Fall? 0 0  0  0  0   Risk for fall due to : No Fall Risks No Fall Risks No Fall Risks No Fall Risks No Fall Risks  Follow up Falls evaluation completed Falls evaluation completed Falls evaluation completed Falls evaluation completed Falls prevention discussed;Education provided;Falls evaluation completed     Data saved with a previous flowsheet row definition     Assessment & Plan Morbid obesity BMI 37.7. Previous dietary modifications unsuccessful. Interested in Zepbound  for weight loss, especially due to obstructive sleep apnea. No history of pancreatitis or medullary adenocarcinoma. Discussed Zepbound  side effects and emphasized smaller portions and protein intake. - Attempted insurance approval for Zepbound  for weight loss and obstructive sleep apnea. - Start Zepbound  at 2.5 mg weekly if approved. - Advised on smaller portion sizes and adequate protein intake. - Discussed alternative option of Wegovy  oral form if Zepbound  is not approved.  Obstructive sleep apnea with hypersomnolence Requires CPAP at 15 cm H2O. Interested in Zepbound  for weight loss to aid sleep apnea management. Discussed importance of consistent CPAP use for insurance approval. - Ensure consistent use of CPAP. - Attempted insurance approval for Zepbound  for obstructive sleep apnea.  Rheumatoid arthritis without rheumatoid factor Managed with methotrexate, hydroxychloroquine, and folic acid. Reports improvement with  hydroxychloroquine. - Continue methotrexate 12.5 mg weekly. - Continue hydroxychloroquine 200 mg twice daily. - Continue folic acid supplementation.  Secondary Sjogren's syndrome Managed with biotin mouthwash and lozenges for dry mouth. Uses vaginal gel and KY jelly for vaginal dryness. - Continue biotin mouthwash and lozenges for dry mouth. - Continue vaginal gel and KY jelly for vaginal dryness.  Fibromyalgia Managed with pregabalin  and tizanidine . Reports some improvement. - Continue pregabalin  75 mg twice daily. - Continue tizanidine  as needed.  Chronic diastolic heart failure Last evaluated with echocardiogram in 2022. Reports shortness of breath with activity and uses multiple pillows at night. No recent cardiology follow-up.  Major depressive disorder, recurrent, moderate Previously severe, now improved with PHQ-9 score of 5. Managed with escitalopram , trazodone , hydroxyzine , and pregabalin . - Continue escitalopram , trazodone , hydroxyzine , and pregabalin  as prescribed.       I discussed the assessment and treatment plan with the patient. The patient was provided an opportunity to ask questions and all were answered. The patient agreed with the plan and demonstrated an understanding of the instructions.  The patient was advised to call back or seek an in-person evaluation if the symptoms worsen or if the condition fails to improve as anticipated.  I provided 25  minutes of non-face-to-face time during this encounter.    [1]  Current Outpatient Medications:    escitalopram  (LEXAPRO ) 10 MG tablet, Take 10 mg by mouth every morning., Disp: , Rfl:    folic acid (FOLVITE) 1 MG tablet, Take 1 mg  by mouth., Disp: , Rfl:    furosemide  (LASIX ) 20 MG tablet, Take 1 tablet (20 mg total) by mouth daily as needed for fluid or edema., Disp: 90 tablet, Rfl: 1   Glucos-Chond-Hyal Ac-Ca Fructo (MOVE FREE JOINT HEALTH ADVANCE PO), Take 1 tablet by mouth daily., Disp: , Rfl:     Glycerin-Hypromellose-PEG 400 (ARTIFICIAL TEARS) 0.2-0.2-1 % SOLN, Place 1 drop into both eyes daily as needed (dry eyes)., Disp: , Rfl:    hydroxychloroquine (PLAQUENIL) 200 MG tablet, Take 200 mg by mouth., Disp: , Rfl:    hydrOXYzine  (VISTARIL ) 25 MG capsule, Take 1 capsule (25 mg total) by mouth 2 (two) times daily as needed., Disp: 180 capsule, Rfl: 0   methotrexate (RHEUMATREX) 2.5 MG tablet, Take 12.5 mg by mouth., Disp: , Rfl:    modafinil  (PROVIGIL ) 200 MG tablet, Take 1 tablet (200 mg total) by mouth daily., Disp: 90 tablet, Rfl: 0   Multiple Vitamin (MULTIVITAMIN) tablet, Take 1 tablet by mouth daily., Disp: , Rfl:    potassium chloride  SA (KLOR-CON  M20) 20 MEQ tablet, Take 1 tablet (20 mEq total) by mouth daily., Disp: 90 tablet, Rfl: 1   pregabalin  (LYRICA ) 75 MG capsule, Take 1 capsule (75 mg total) by mouth 2 (two) times daily., Disp: 180 capsule, Rfl: 0   tiZANidine  (ZANAFLEX ) 4 MG tablet, TAKE 1 TABLET (4 MG TOTAL) BY MOUTH 2 (TWO) TIMES DAILY AS NEEDED FOR MUSCLE SPASMS., Disp: 60 tablet, Rfl: 0   traZODone  (DESYREL ) 50 MG tablet, Take 50-100 mg by mouth at bedtime as needed., Disp: , Rfl:    valACYclovir  (VALTREX ) 500 MG tablet, TAKE 1 TABLET (500 MG TOTAL) BY MOUTH DAILY. AND TWICE DAILY FOR OUTBREAKS, Disp: 180 tablet, Rfl: 1   Vitamin D , Ergocalciferol , (DRISDOL ) 1.25 MG (50000 UNIT) CAPS capsule, Take 50,000 Units by mouth once a week., Disp: , Rfl:    zinc gluconate 50 MG tablet, Take 50 mg by mouth daily., Disp: , Rfl:    meloxicam (MOBIC) 15 MG tablet, Take 15 mg by mouth daily. (Patient not taking: Reported on 12/25/2024), Disp: , Rfl:  [2] No Known Allergies  "

## 2024-12-26 NOTE — Telephone Encounter (Signed)
 Requested medications are due for refill today.  yes  Requested medications are on the active medications list.  yes  Last refill. 12/25/2024   Future visit scheduled.   yes  Notes to clinic.  Pharmacy comment: Alternative Requested.     Requested Prescriptions  Pending Prescriptions Disp Refills   ZEPBOUND  2.5 MG/0.5ML Pen [Pharmacy Med Name: ZEPBOUND  2.5 MG/0.5 ML PEN]  0    Sig: INJECT 2.5 MG SUBCUTANEOUSLY WEEKLY     Off-Protocol Failed - 12/26/2024  1:42 PM      Failed - Medication not assigned to a protocol, review manually.      Passed - Valid encounter within last 12 months    Recent Outpatient Visits           Yesterday Morbid obesity [facility])   [facility] Glenard Mire, MD   3 months ago Chronic diastolic heart failure [facility])   [place] [facility] Glenard Mire, MD   4 months ago Well adult exam   [facility] Sowles, Krichna, MD   6 months ago Chronic diastolic heart failure [facility])   [facility] Sowles, Krichna, MD   6 months ago Numbness and tingling in both hands   [facility] Gareth Mliss FALCON, FNP       Future Appointments             In 8 months Sowles, Krichna, MD [facility]

## 2024-12-27 ENCOUNTER — Other Ambulatory Visit: Payer: Self-pay | Admitting: Family Medicine

## 2024-12-27 DIAGNOSIS — G4710 Hypersomnia, unspecified: Secondary | ICD-10-CM

## 2024-12-30 ENCOUNTER — Ambulatory Visit

## 2024-12-30 NOTE — Telephone Encounter (Signed)
 Requested medication (s) are due for refill today: yes  Requested medication (s) are on the active medication list: yes  Last refill:  12/25/24  Future visit scheduled: yes  Notes to clinic:    Pharmacy comment: Alternative Requested:NOT COVERED.       Requested Prescriptions  Pending Prescriptions Disp Refills   ZEPBOUND  2.5 MG/0.5ML Pen [Pharmacy Med Name: ZEPBOUND  2.5 MG/0.5 ML PEN]  0    Sig: INJECT 2.5 MG SUBCUTANEOUSLY WEEKLY     Off-Protocol Failed - 12/30/2024 11:19 AM      Failed - Medication not assigned to a protocol, review manually.      Passed - Valid encounter within last 12 months    Recent Outpatient Visits           5 days ago Morbid obesity [facility])   [facility] Glenard Mire, MD   3 months ago Chronic diastolic heart failure [facility])   [facility] Glenard Mire, MD   4 months ago Well adult exam   [facility] Glenard Mire, MD   6 months ago Chronic diastolic heart failure [facility])   [place] Sowles, Krichna, MD   6 months ago Numbness and tingling in both hands   [facility] Gareth Mliss FALCON, FNP       Future Appointments             In 8 months Sowles, Krichna, MD [facility] [place]

## 2024-12-31 ENCOUNTER — Ambulatory Visit: Admitting: Family Medicine

## 2024-12-31 ENCOUNTER — Encounter: Payer: Self-pay | Admitting: Family Medicine

## 2024-12-31 ENCOUNTER — Other Ambulatory Visit (HOSPITAL_COMMUNITY): Payer: Self-pay

## 2024-12-31 ENCOUNTER — Telehealth: Payer: Self-pay | Admitting: Pharmacy Technician

## 2024-12-31 VITALS — BP 122/78 | HR 86 | Temp 98.0°F | Resp 18 | Ht 63.0 in | Wt 216.0 lb

## 2024-12-31 DIAGNOSIS — G4730 Sleep apnea, unspecified: Secondary | ICD-10-CM

## 2024-12-31 DIAGNOSIS — G4710 Hypersomnia, unspecified: Secondary | ICD-10-CM

## 2024-12-31 DIAGNOSIS — F332 Major depressive disorder, recurrent severe without psychotic features: Secondary | ICD-10-CM | POA: Diagnosis not present

## 2024-12-31 DIAGNOSIS — F331 Major depressive disorder, recurrent, moderate: Secondary | ICD-10-CM | POA: Insufficient documentation

## 2024-12-31 DIAGNOSIS — F5104 Psychophysiologic insomnia: Secondary | ICD-10-CM

## 2024-12-31 NOTE — Telephone Encounter (Signed)
 PA request has been Received. New Encounter has been or will be created for follow up. For additional info see Pharmacy Prior Auth telephone encounter from 12/31/24.

## 2024-12-31 NOTE — Telephone Encounter (Signed)
 Pharmacy Patient Advocate Encounter   Received notification from RX Request Messages that prior authorization for Zepbound  2.5 mg/0.22ml pen is required/requested.   Insurance verification completed.   The patient is insured through [employer].   Per test claim: Per test claim, medication is not covered due to plan/benefit exclusion, PA not submitted at this time

## 2024-12-31 NOTE — Progress Notes (Signed)
 Name: Teresa Gallagher   MRN: [id number]    DOB: 08/31/1961   Date:12/31/2024       Progress Note  Subjective  Chief Complaint  Chief Complaint  Patient presents with   paperwork    Pt states disability to be taken out permeant and she states was told by you to bring in paperwork   Discussed the use of AI scribe software for clinical note transcription with the patient, who gave verbal consent to proceed.  History of Present Illness Teresa Gallagher is a 64 year old female with major depression and obstructive sleep apnea who presents for disability documentation.  She experiences major depression with daily feelings of being down, depressed, or hopeless, and has little interest or pleasure in activities. Concentration difficulties, fatigue, and low energy levels are persistent. She frequently overeats and has feelings of low self-worth, occasionally contemplating being better off dead. 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 She is under psychiatric care and takes Lexapro  10 mg, but significant depressive symptoms persist.  She suffers from obstructive sleep apnea with hypersomnolence, affecting her cognition and focus. 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 Sleep disturbances include trouble falling asleep, staying asleep, and nightmares, leaving her exhausted. She wakes up around 2 or 3 AM and cannot return to sleep. She takes trazodone  for sleep and hydroxyzine  for anxiety but continues to have sleep issues.  Her medical history includes rheumatoid arthritis, chronic diastolic heart failure, Sjogren's syndrome, and fibromyalgia syndrome. She reports that her physical conditions make it difficult to perform tasks requiring physical exertion, such as lifting mannequins and assisting patients, which are part of her teaching responsibilities.    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) 12/25/2024   Secondary Sjogren's syndrome 12/25/2024   Prediabetes 10/14/2024   Non-toxic multinodular goiter 11/29/2023   Adenomatous polyp of descending colon 11/08/2022   Rectal polyp 11/08/2022   Chronic diastolic heart failure (HCC) 06/10/2022   Breast lump on right side at 10 o'clock position 06/10/2022   Circadian rhythm sleep disorder, shift work type 03/23/2021   Attention and concentration deficit 01/20/2020   Alcohol use disorder, mild, in early remission 10/29/2019   Old tear of meniscus of right knee 03/20/2019   SOB (shortness of breath) 08/29/2017   Episodic tension-type headache, not intractable 12/20/2016   History of blood transfusion 07/30/2015   GAD (generalized anxiety disorder) 07/30/2015   Chronic constipation 07/13/2015   Insomnia 07/13/2015   MDD (major depressive disorder), recurrent episode, mild (HCC) 07/13/2015   Fibromyalgia syndrome 07/13/2015   Herpes simplex type 2 infection 07/13/2015   Morbid obesity (HCC) 07/13/2015   Sleep apnea with hypersomnolence 07/13/2015   Vitamin D  deficiency 07/13/2015    Past Surgical History:  Procedure Laterality Date   ABDOMINAL HYSTERECTOMY     BUNIONECTOMY Right 11/07/2014   Dr. Primus at [facility]    CARDIAC CATHETERIZATION     CARPAL TUNNEL RELEASE Left 12/19/2010   CESAREAN SECTION     COLONOSCOPY WITH PROPOFOL  N/A 11/08/2022   Procedure: COLONOSCOPY WITH PROPOFOL ;  Surgeon: Unk Corinn Skiff, MD;  Location: [facility] ENDOSCOPY;  Service: Gastroenterology;  Laterality: N/A;   LAPAROSCOPIC HYSTERECTOMY     followed by 3 cuff repairs   LIPOMA EXCISION  12/19/2010   REFRACTIVE SURGERY Bilateral    RIGHT/LEFT HEART CATH AND CORONARY  ANGIOGRAPHY N/A 09/02/2021   Procedure: RIGHT/LEFT HEART CATH AND CORONARY ANGIOGRAPHY;  Surgeon: Cherrie Toribio SAUNDERS, MD;  Location: [facility] INVASIVE CV LAB;  Service: Cardiovascular;  Laterality:  N/A;   TONSILLECTOMY      Family History  Problem Relation Age of Onset   Heart attack Father    Alcohol abuse Father    Sleep apnea Father    Alcoholism Father    COPD Sister        end stages   Bipolar disorder Brother    Depression Brother    Anxiety disorder Brother    Arthritis Brother    Obesity Brother    Bipolar disorder Brother    Depression Brother    Alcohol abuse Brother    Intellectual disability Son    Breast cancer Maternal Aunt    Breast cancer Paternal Aunt    Heart attack Paternal Uncle     Social History   Tobacco Use   Smoking status: Never   Smokeless tobacco: Never  Substance Use Topics   Alcohol use: Not Currently    Current Medications[1]  Allergies[2]  I personally reviewed active problem list, medication list, allergies, family history with the patient/caregiver today.   ROS  Ten systems reviewed and is negative except as mentioned in HPI    Objective Physical Exam CONSTITUTIONAL: Patient appears well-developed and well-nourished.  No distress. HEENT: Head atraumatic, normocephalic, neck supple. CARDIOVASCULAR: Normal rate, regular rhythm and normal heart sounds.  No murmur heard. No BLE edema. PULMONARY: Effort normal and breath sounds normal. No respiratory distress. ABDOMINAL: There is no tenderness or distention. MUSCULOSKELETAL: Normal gait. Without gross motor or sensory deficit. PSYCHIATRIC: Patient has a normal mood and affect. behavior is normal. Judgment and thought content normal.  Vitals:   12/31/24 1445  BP: 122/78  Pulse: 86  Resp: 18  Temp: 98 F (36.7 C)  SpO2: 97%  Weight: 216 lb (98 kg)  Height: 5' 3 (1.6 m)    Body mass index is 38.26 kg/m.   PHQ2/9:    12/25/2024   11:43 AM 09/26/2024    9:31 AM 09/24/2024    9:55 AM 08/28/2024    2:59 PM 06/24/2024    9:46 AM  Depression screen PHQ 2/9  Decreased Interest 1 3 2 2 2   Down, Depressed, Hopeless 1 2 2 2 2   PHQ - 2 Score 2 5 4 4 4   Altered sleeping  1 3 2 2 2   Tired, decreased energy 1 3 2 2 2   Change in appetite 0 2 2 2 2   Feeling bad or failure about yourself  0 1 2 2 2   Trouble concentrating 1 2 2 2 2   Moving slowly or fidgety/restless 0 1 0 0 0  Suicidal thoughts 0 0 0 0 0  PHQ-9 Score 5 17  14  14  14    Difficult doing work/chores Somewhat difficult  Very difficult Very difficult Very difficult     Data saved with a previous flowsheet row definition    phq 9 is positive  Fall Risk:    12/25/2024   11:43 AM 09/24/2024    9:52 AM 08/28/2024    2:59 PM 06/24/2024    9:46 AM 11/29/2023    1:58 PM  Fall Risk   Falls in the past year? 0 0 0 0 0  Number falls in past yr: 0 0 0 0 0  Injury with Fall? 0 0  0  0  0   Risk for fall  due to : No Fall Risks No Fall Risks No Fall Risks No Fall Risks No Fall Risks  Follow up Falls evaluation completed Falls evaluation completed Falls evaluation completed Falls evaluation completed Falls prevention discussed;Education provided;Falls evaluation completed     Data saved with a previous flowsheet row definition     Assessment & Plan Severe recurrent major depressive disorder Chronic severe major depressive disorder with persistent symptoms impacting daily functioning. Current treatment includes Lexapro , Lyrica , and therapy, but symptoms persist. PHQ-9 score indicates severe depression. - Continue Lexapro  10 mg daily. - Continue Lyrica  for pain and mood stabilization. - Continue therapy sessions.  Obstructive sleep apnea with hypersomnolence Chronic obstructive sleep apnea with hypersomnolence contributing to cognitive impairment and fatigue. Current management includes CPAP and modafinil , but symptoms persist. - Continue CPAP therapy. - Continue modafinil  for wakefulness.  Psychophysiologic insomnia Chronic psychophysiologic insomnia with difficulty maintaining sleep. Current treatment includes trazodone  and hydroxyzine , but symptoms persist. - Continue trazodone  for sleep. - Continue  hydroxyzine  for anxiety.  Forms filled out, patient is unable to resume work, she cannot focus, lack of motivation despite psychiatrist and psychologist intervention and compliance with treatment         [1]  Current Outpatient Medications:    escitalopram  (LEXAPRO ) 10 MG tablet, Take 10 mg by mouth every morning., Disp: , Rfl:    folic acid (FOLVITE) 1 MG tablet, Take 1 mg by mouth., Disp: , Rfl:    furosemide  (LASIX ) 20 MG tablet, Take 1 tablet (20 mg total) by mouth daily as needed for fluid or edema., Disp: 90 tablet, Rfl: 1   Glucos-Chond-Hyal Ac-Ca Fructo (MOVE FREE JOINT HEALTH ADVANCE PO), Take 1 tablet by mouth daily., Disp: , Rfl:    Glycerin-Hypromellose-PEG 400 (ARTIFICIAL TEARS) 0.2-0.2-1 % SOLN, Place 1 drop into both eyes daily as needed (dry eyes)., Disp: , Rfl:    hydroxychloroquine (PLAQUENIL) 200 MG tablet, Take 200 mg by mouth., Disp: , Rfl:    hydrOXYzine  (VISTARIL ) 25 MG capsule, Take 1 capsule (25 mg total) by mouth 2 (two) times daily as needed., Disp: 180 capsule, Rfl: 0   methotrexate (RHEUMATREX) 2.5 MG tablet, Take 12.5 mg by mouth., Disp: , Rfl:    modafinil  (PROVIGIL ) 200 MG tablet, Take 1 tablet (200 mg total) by mouth daily., Disp: 90 tablet, Rfl: 0   Multiple Vitamin (MULTIVITAMIN) tablet, Take 1 tablet by mouth daily., Disp: , Rfl:    potassium chloride  SA (KLOR-CON  M20) 20 MEQ tablet, Take 1 tablet (20 mEq total) by mouth daily., Disp: 90 tablet, Rfl: 1   pregabalin  (LYRICA ) 75 MG capsule, Take 1 capsule (75 mg total) by mouth 2 (two) times daily., Disp: 180 capsule, Rfl: 0   tirzepatide  (ZEPBOUND ) 2.5 MG/0.5ML Pen, Inject 2.5 mg into the skin once a week., Disp: 2 mL, Rfl: 0   tiZANidine  (ZANAFLEX ) 4 MG tablet, Take 1 tablet (4 mg total) by mouth 2 (two) times daily as needed for muscle spasms., Disp: 180 tablet, Rfl: 1   traZODone  (DESYREL ) 50 MG tablet, Take 50-100 mg by mouth at bedtime as needed., Disp: , Rfl:    valACYclovir  (VALTREX ) 500 MG tablet,  TAKE 1 TABLET (500 MG TOTAL) BY MOUTH DAILY. AND TWICE DAILY FOR OUTBREAKS, Disp: 180 tablet, Rfl: 1   Vitamin D , Ergocalciferol , (DRISDOL ) 1.25 MG (50000 UNIT) CAPS capsule, Take 50,000 Units by mouth once a week., Disp: , Rfl:    zinc gluconate 50 MG tablet, Take 50 mg by mouth daily., Disp: , Rfl:  meloxicam (MOBIC) 15 MG tablet, Take 15 mg by mouth daily. (Patient not taking: Reported on 12/31/2024), Disp: , Rfl:  [2] No Known Allergies

## 2025-01-01 ENCOUNTER — Ambulatory Visit

## 2025-01-06 ENCOUNTER — Ambulatory Visit

## 2025-01-08 ENCOUNTER — Ambulatory Visit

## 2025-01-10 ENCOUNTER — Other Ambulatory Visit (HOSPITAL_COMMUNITY): Payer: Self-pay

## 2025-01-13 ENCOUNTER — Ambulatory Visit

## 2025-01-15 ENCOUNTER — Ambulatory Visit

## 2025-01-20 ENCOUNTER — Ambulatory Visit

## 2025-01-22 ENCOUNTER — Ambulatory Visit

## 2025-01-27 ENCOUNTER — Ambulatory Visit

## 2025-01-29 ENCOUNTER — Ambulatory Visit

## 2025-04-01 ENCOUNTER — Ambulatory Visit: Admitting: Family Medicine

## 2025-08-29 ENCOUNTER — Encounter: Admitting: Family Medicine
